# Patient Record
Sex: Male | Born: 1935 | Race: White | Hispanic: No | Marital: Married | Smoking: Former smoker
Health system: Southern US, Community
[De-identification: ages and names within clinical notes are randomized; demographics above are authoritative.]

## PROBLEM LIST (undated history)

## (undated) DIAGNOSIS — E162 Hypoglycemia, unspecified: Secondary | ICD-10-CM

## (undated) DIAGNOSIS — I712 Thoracic aortic aneurysm, without rupture, unspecified: Secondary | ICD-10-CM

## (undated) DIAGNOSIS — I2510 Atherosclerotic heart disease of native coronary artery without angina pectoris: Secondary | ICD-10-CM

## (undated) DIAGNOSIS — I714 Abdominal aortic aneurysm, without rupture, unspecified: Secondary | ICD-10-CM

## (undated) DIAGNOSIS — Z720 Tobacco use: Secondary | ICD-10-CM

## (undated) DIAGNOSIS — J449 Chronic obstructive pulmonary disease, unspecified: Secondary | ICD-10-CM

## (undated) DIAGNOSIS — IMO0002 Reserved for concepts with insufficient information to code with codable children: Secondary | ICD-10-CM

## (undated) DIAGNOSIS — C3490 Malignant neoplasm of unspecified part of unspecified bronchus or lung: Secondary | ICD-10-CM

## (undated) DIAGNOSIS — I10 Essential (primary) hypertension: Secondary | ICD-10-CM

## (undated) DIAGNOSIS — E7800 Pure hypercholesterolemia, unspecified: Secondary | ICD-10-CM

## (undated) DIAGNOSIS — I350 Nonrheumatic aortic (valve) stenosis: Secondary | ICD-10-CM

## (undated) DIAGNOSIS — I739 Peripheral vascular disease, unspecified: Secondary | ICD-10-CM

## (undated) DIAGNOSIS — Z973 Presence of spectacles and contact lenses: Secondary | ICD-10-CM

## (undated) DIAGNOSIS — Z9289 Personal history of other medical treatment: Secondary | ICD-10-CM

## (undated) DIAGNOSIS — R3129 Other microscopic hematuria: Secondary | ICD-10-CM

## (undated) DIAGNOSIS — IMO0001 Reserved for inherently not codable concepts without codable children: Secondary | ICD-10-CM

## (undated) DIAGNOSIS — M109 Gout, unspecified: Secondary | ICD-10-CM

## (undated) HISTORY — DX: Thoracic aortic aneurysm, without rupture, unspecified: I71.20

## (undated) HISTORY — PX: CATARACT EXTRACTION W/ INTRAOCULAR LENS IMPLANT: SHX1309

## (undated) HISTORY — PX: APPENDECTOMY: SHX54

## (undated) HISTORY — DX: Chronic obstructive pulmonary disease, unspecified: J44.9

## (undated) HISTORY — PX: NASAL HEMORRHAGE CONTROL: SHX287

## (undated) HISTORY — DX: Thoracic aortic aneurysm, without rupture: I71.2

## (undated) HISTORY — PX: CARDIAC VALVE REPLACEMENT: SHX585

## (undated) HISTORY — DX: Pure hypercholesterolemia, unspecified: E78.00

## (undated) HISTORY — DX: Essential (primary) hypertension: I10

## (undated) HISTORY — DX: Nonrheumatic aortic (valve) stenosis: I35.0

## (undated) HISTORY — PX: TONSILLECTOMY: SUR1361

## (undated) HISTORY — DX: Peripheral vascular disease, unspecified: I73.9

## (undated) HISTORY — DX: Abdominal aortic aneurysm, without rupture, unspecified: I71.40

## (undated) HISTORY — PX: OTHER SURGICAL HISTORY: SHX169

## (undated) HISTORY — PX: CARDIAC CATHETERIZATION: SHX172

## (undated) HISTORY — PX: CATARACT EXTRACTION: SUR2

## (undated) HISTORY — DX: Other microscopic hematuria: R31.29

## (undated) HISTORY — DX: Abdominal aortic aneurysm, without rupture: I71.4

---

## 2011-09-16 DIAGNOSIS — I70209 Unspecified atherosclerosis of native arteries of extremities, unspecified extremity: Secondary | ICD-10-CM | POA: Insufficient documentation

## 2013-09-06 ENCOUNTER — Encounter: Payer: Medicare Other | Admitting: Thoracic Surgery (Cardiothoracic Vascular Surgery)

## 2013-09-06 ENCOUNTER — Ambulatory Visit (HOSPITAL_COMMUNITY)
Admission: RE | Admit: 2013-09-06 | Discharge: 2013-09-06 | Disposition: A | Discharge status: Home / Self Care | Payer: Medicare Other | Source: Ambulatory Visit | Attending: Cardiothoracic Surgery | Admitting: Cardiothoracic Surgery

## 2013-09-06 ENCOUNTER — Encounter: Payer: Self-pay | Admitting: Cardiothoracic Surgery

## 2013-09-06 ENCOUNTER — Encounter: Payer: Self-pay | Admitting: *Deleted

## 2013-09-06 ENCOUNTER — Other Ambulatory Visit: Payer: Self-pay | Admitting: *Deleted

## 2013-09-06 ENCOUNTER — Institutional Professional Consult (permissible substitution) (INDEPENDENT_AMBULATORY_CARE_PROVIDER_SITE_OTHER): Payer: Medicare Other | Admitting: Cardiothoracic Surgery

## 2013-09-06 VITALS — BP 126/76 | HR 66 | Resp 16 | Ht 68.5 in | Wt 145.0 lb

## 2013-09-06 DIAGNOSIS — I712 Thoracic aortic aneurysm, without rupture, unspecified: Secondary | ICD-10-CM | POA: Insufficient documentation

## 2013-09-06 DIAGNOSIS — R911 Solitary pulmonary nodule: Secondary | ICD-10-CM

## 2013-09-06 DIAGNOSIS — D381 Neoplasm of uncertain behavior of trachea, bronchus and lung: Secondary | ICD-10-CM

## 2013-09-06 DIAGNOSIS — I350 Nonrheumatic aortic (valve) stenosis: Secondary | ICD-10-CM

## 2013-09-06 DIAGNOSIS — I739 Peripheral vascular disease, unspecified: Secondary | ICD-10-CM | POA: Insufficient documentation

## 2013-09-06 DIAGNOSIS — J984 Other disorders of lung: Secondary | ICD-10-CM | POA: Diagnosis present

## 2013-09-06 DIAGNOSIS — J449 Chronic obstructive pulmonary disease, unspecified: Secondary | ICD-10-CM

## 2013-09-06 DIAGNOSIS — I714 Abdominal aortic aneurysm, without rupture, unspecified: Secondary | ICD-10-CM | POA: Insufficient documentation

## 2013-09-06 DIAGNOSIS — I38 Endocarditis, valve unspecified: Secondary | ICD-10-CM

## 2013-09-06 DIAGNOSIS — I359 Nonrheumatic aortic valve disorder, unspecified: Secondary | ICD-10-CM

## 2013-09-06 DIAGNOSIS — I10 Essential (primary) hypertension: Secondary | ICD-10-CM | POA: Insufficient documentation

## 2013-09-06 DIAGNOSIS — E7800 Pure hypercholesterolemia, unspecified: Secondary | ICD-10-CM | POA: Insufficient documentation

## 2013-09-06 LAB — PULMONARY FUNCTION TEST
DL/VA % pred: 58 %
DL/VA: 2.59 ml/min/mmHg/L
DLCO cor % pred: 42 %
DLCO cor: 12.74 ml/min/mmHg
DLCO unc % pred: 42 %
DLCO unc: 12.74 ml/min/mmHg
FEF 25-75 Post: 0.7 L/sec
FEF 25-75 Pre: 0.57 L/sec
FEF2575-%Change-Post: 22 %
FEF2575-%Pred-Post: 41 %
FEF2575-%Pred-Pre: 34 %
FEV1-%Change-Post: 2 %
FEV1-%Pred-Post: 60 %
FEV1-%Pred-Pre: 59 %
FEV1-Post: 1.52 L
FEV1-Pre: 1.49 L
FEV1FVC-%Change-Post: -5 %
FEV1FVC-%Pred-Pre: 77 %
FEV6-%Change-Post: 8 %
FEV6-%Pred-Post: 83 %
FEV6-%Pred-Pre: 76 %
FEV6-Post: 2.77 L
FEV6-Pre: 2.56 L
FEV6FVC-%Change-Post: 0 %
FEV6FVC-%Pred-Post: 102 %
FEV6FVC-%Pred-Pre: 102 %
FVC-%Change-Post: 8 %
FVC-%Pred-Post: 82 %
FVC-%Pred-Pre: 75 %
FVC-Post: 2.94 L
FVC-Pre: 2.7 L
Post FEV1/FVC ratio: 52 %
Post FEV6/FVC ratio: 94 %
Pre FEV1/FVC ratio: 55 %
Pre FEV6/FVC Ratio: 95 %
RV % pred: 107 %
RV: 2.8 L
TLC % pred: 91 %
TLC: 6.07 L

## 2013-09-06 MED ORDER — ALBUTEROL SULFATE (2.5 MG/3ML) 0.083% IN NEBU
2.5000 mg | INHALATION_SOLUTION | Freq: Once | RESPIRATORY_TRACT | Status: AC
  Administered 2013-09-06: 2.5 mg via RESPIRATORY_TRACT

## 2013-09-06 NOTE — Progress Notes (Signed)
[address]       [place] [address]             [phone]                    [name] Record #[id number] Date of Birth: 07-20-1929  Referring: Augustin Coupe, PA Primary Care: Augustin Coupe, [place]  Chief Complaint:    Chief Complaint  Patient presents with  . Lung Lesion    EVAL AND TREAT....ct chest, pet, echo    History of Present Illness:    Alfred Hall 80 y.o. male is seen in the office  today for newly diagnosed left upper lobe lung mass. The patient has been a long-term smoker x60 years with underlying COPD, limited in his pulmonary reserve but not on oxygen at home. He had a known abdominal aortic aneurysms followed at [facility] but not seen since 2013. He had a recent "checkup" at the [facility] in [facility], a chest x-ray suggested a little left upper lobe lung mass. His local provider in [place] arrange CT scan of the chest , PET scan, and echocardiogram. He is referred for further evaluation and deciding on a treatment plan.  In addition to the suspicious left upper lobe lung mass, he's recently been found to have a 5.5 cm ascending aortic aneurysm, critical aortic stenosis by echocardiogram ejection fraction 40-45% peak gradient 77 mean gradient 53, and aortic valve velocity 4.4 m/s, and moderate mitral stenosis. Abdominal aortic aneurysm has increased in size from 4.6 cm in 2013 to 5.2 now.   Surprisingly the patient has few symptoms but is fairly sedentary in his lifestyle. He does care for his ill wife, does light housework grocery shopping, but does not do any yard work around his house because of shortness of breath with exertion. He denies any syncope, denies angina, denies pedal edema. His major limiting symptom is shortness of breath with exertion.   Current Activity/ Functional Status:  Patient is independent with mobility/ambulation, transfers, ADL's, IADL's.   Zubrod Score: At the  time of surgery this patient's most appropriate activity status/level should be described as: []     0    Normal activity, no symptoms [x]     1    Restricted in physical strenuous activity but ambulatory, able to do out light work []     2    Ambulatory and capable of self care, unable to do work activities, up and about               >50 % of waking hours                              []     3    Only limited self care, in bed greater than 50% of waking hours []     4    Completely disabled, no self care, confined to bed or chair []     5    Moribund   Past Medical History  Diagnosis Date  . Lung nodule   . PVD (peripheral vascular disease)   . Microhematuria     workup negative  . Aortic aneurysm, abdominal     followed at [facility]  . Hypercholesteremia   . Hypertension   . Valvular heart disease  recent echocardiogram noted above   . Thoracic aortic aneurysm without rupture  recently noted on  CT of the chest  by my measurement 5.2 x 5.4     Past Surgical History  Procedure Laterality Date  . Cataract extraction    . Eye surgery      CATARACT    Family History  Problem Relation Age of Onset  . Renal Disease Father   . Congestive Heart Failure Mother    patient's mother died of COPD at age 78, father died at age 42 with diabetes, one sister is alive with COPD  History   Social History  . Marital Status: Married    Spouse Name: N/A    Number of Children: 3  . Years of Education: N/A   Occupational History  . retired from [employer]  . Smoking status: Current Every Day Smoker -- 0.50 packs/day for 60 years    Types: Cigarettes  . Smokeless tobacco: Never Used  . Alcohol Use: No  . Drug Use: No  . Sexual Activity: Not on file      History  Smoking status  . Current Every Day Smoker -- 0.50 packs/day for 60 years  . Types: Cigarettes  Smokeless tobacco  . Never Used    History  Alcohol Use No     No Known Allergies  Current  Outpatient Prescriptions  Medication Sig Dispense Refill  . aspirin EC 325 MG tablet Take 325 mg by mouth daily.      . Garlic 295 MG CAPS Take 1 capsule by mouth daily.      . hydrochlorothiazide (HYDRODIURIL) 25 MG tablet Take 25 mg by mouth daily.      . Multiple Vitamins-Minerals (CENTRUM SILVER ADULT 50+ PO) Take 1 tablet by mouth daily.      . Omega-3 Fatty Acids (FISH OIL) 1000 MG CAPS Take 1 capsule by mouth daily.      . simvastatin (ZOCOR) 80 MG tablet Take 80 mg by mouth daily. Takes 1/2 tab daily      . vitamin E (VITAMIN E) 400 UNIT capsule Take 400 Units by mouth daily.       No current facility-administered medications for this visit.     Review of Systems:     Cardiac Review of Systems: Y or N  Chest Pain [  n  ]  Resting SOB [n   ] Exertional SOB  Blue.Reese  ]  Orthopnea Florencio.Farrier  ]   Pedal Edema [ n  ]    Palpitations [ n ] Syncope  [n  ]   Presyncope [n   ]  General Review of Systems: [Y] = yes [  ]=no Constitional: recent weight change [n  ];  Wt loss over the last 3 months [   ] anorexia [  ]; fatigue Blue.Reese  ]; nausea [n  ]; night sweats [ n ]; fever [n  ]; or chills [n  ];          Dental: poor dentition[ dentures ]; Last Dentist visit:   Eye : blurred vision [  ]; diplopia [   ]; vision changes [  ];  Amaurosis fugax[  ]; Resp: cough [  ];  wheezing[  ];  hemoptysis[  ]; shortness of breath[ y ]; paroxysmal nocturnal dyspnea[ n ]; dyspnea on exertion[ y ]; or orthopnea[ n ];  GI:  gallstones[  ], vomiting[n  ];  dysphagia[  ]; melena[  ];  hematochezia [  ]; heartburn[  ];   Hx of  Colonoscopy[  ];  GU: kidney stones [n  ]; hematuria[ n ];   dysuria [  ];  nocturia[  ];  history of     obstruction [  ]; urinary frequency [  ]             Skin: rash, swelling[  ];, hair loss[  ];  peripheral edema[ n ];  or itching[  ]; Musculosketetal: myalgias[  ];  joint swelling[  ];  joint erythema[  ];  joint pain[  ];  back pain[  ];  Heme/Lymph: bruising[ y ];  bleeding[  ];  anemia[  ];    Neuro: TIA[  ];  headaches[  ];  stroke[  ];  vertigo[  ];  seizures[  ];   paresthesias[  ];  difficulty walking[ n ];  Psych:depression[  ]; anxiety[  ];  Endocrine: diabetes[n  ];  thyroid dysfunction[n  ];  Immunizations: Flu up to date Blue.Reese  ]; Pneumococcal up to date [ y ];  Other:  Physical Exam: BP 126/76  Pulse 66  Resp 16  Ht 5' 8.5" (1.74 m)  Wt 145 lb (65.772 kg)  BMI 21.72 kg/m2  SpO2 96%  PHYSICAL EXAMINATION:  General appearance: alert, cooperative, appears stated age and no distress Neurologic: intact Heart: regular rate and rhythm and systolic murmur: holosystolic 4/6, crescendo throughout the precordium Lungs: clear to auscultation bilaterally Abdomen: soft, non-tender; bowel sounds normal; no masses,  no organomegaly Extremities: extremities normal, atraumatic, no cyanosis or edema and Homans sign is negative, no sign of DVT Patient has no cervical or supraclavicular adenopathy   Diagnostic Studies & Laboratory data:     Recent Radiology Findings:    CT THORAX(CHEST) W/IV CONT. . Aug 23, 2013 10:44:49 AM HISTORY: left lobe mass 518.89 OTHER DISEASES OF LUNG, NOT ELSEWHERE CLASSIFIED COMPARISON: None TECHNIQUE: CT of the chest was performed following IV contrast administration (147ml Isovue).  FINDINGS: Mediastinum: 1.5 cm right thyroid nodule with internal calcifications . Ascending aorta is dilated measuring 5.1 cm. Severe aortic valve plane calcifications are present. There is severe descending aortic atherosclerosis with ulcerated plaques image 57. Esophagus appears normal. No mm adenopathy.  Limited abdomen: Simple attenuation left renal cyst. Otherwise negative.  Musculoskeletal: Bones osteopenic. Minimal spinal curvature.  Lungs: Polyps or more likely extensive debris within the trachea. Severe emphysema. Mild bronchial wall thickening. 2.0 cm spiculated nodule in the left upper lobe. There is a small amount of adjacent pneumonitis. This abuts the  major fissure and is distant from the carina. No effusion.  Impression: 1. 2.0 cm nodule in the left upper lobe compatible with bronchogenic carcinoma. No adenopathy by size criteria or sign of distant metastasis. 2. Aortic stenosis with aneurysmal dilatation of the ascending aorta. 3. Severe emphysema. 4. Severe atherosclerosis. 1.57 calcified right thyroid nodule.. 5. Polyps or debris within the trachea. Debris is favored.   NM PET/CT SKULL BASE TO MID THIGH. Aug 28, 2013 08:30:00 PM  Clinical Information: 793.11 SOLITARY PULMONARY NODULE 2CT  Radiopharmaceutical: 12.60 mCi of F-18 Fluorodeoxyglucose, IV.  Technique: Images from skull base to proximal femora were obtained and reconstructed in the sagittal, coronal and axial planes. CT localizer scan was performed with CT fusion images evaluated in the workstation.  The patient's blood Glucose level measures 96 mg/dl. The patient stands 5 ft 8 inches and weighs 145 lbs. Uptake time: 60 min. CTDI: 6.39 mGy.  Findings: There is no prior PET scan for comparison. The 2 cm spiculated nodule in in the left upper lobe  shows marked FDG accumulation with a mean uptake of 14.20, maximal 20.95 SUV by bodyweight. No mediastinal or hilar nodal uptake is seen. There is minimal uptake in the oral pharyngeal area which is physiologic. There is also physiologic uptake within the myocardium, the liver, spleen, kidneys with tracer accumulation within the urinary bladder. Activity with the bowel lumen appears to be luminal.  There is an aneurysm of the ascending aorta measuring 5.9 cm x 5.3 cm. A right parapelvic cyst is present. There is a 5 cm low density oval mass in the upper pole of the left kidney with a mean attenuation value of 13 Hounsfield units. Cholecystectomy clips are seen. There is a large infrarenal abdominal aneurysm which does not extend into the bifurcation measuring 5.2 cm x 5.1 cm. Mild dilatation of the right common iliac artery is seen. The  prostate gland is enlarged with some coarse calcification within it. Possible hydroceles in the scrotum.  Impression: 1. Marked FDG uptake in the left upper lobe nodule compatible with a malignant neoplastic process. 2. No definite evidence of mediastinal or hilar metastases. No suspicious uptake in the neck, abdomen or pelvis. 3. There is an aneurysm of the ascending aorta measuring 5.9 cm x 5.3 cm. Large infrarenal abdominal aortic aneurysm measures 5.2 cm x 5.1 cm. 4. Mild dilatation of the proximal right common iliac artery. 5. Enlarged prostate gland. There may be hydrocele in the scrotum.     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 I discussed the radiographic findings and other newly discovered medical problems with the patient and his daughter. I recommended #1 evaluation by cardiology  #2 pulmonary function studies- done today with FEV1 of 1.49 59% of predicted DLCO significantly decreased 12.74  42%  We'll have the patient return to the multidisciplinary thoracic oncology clinic to see me and radiation oncology, if we decide the patient's underlying risk for pulmonary resection is too high consideration for stereotactic radiotherapy of the left upper lobe lung lesion may be appropriate.  I plan to see the patient back in the multidisciplinary thoracic oncology clinic in 2 weeks (aug 6)    Grace Isaac MD      [address] [place] [address] Office  [phone]   Beeper [phone]  09/06/2013 2:17 PM

## 2013-09-07 ENCOUNTER — Telehealth: Payer: Self-pay | Admitting: *Deleted

## 2013-09-07 NOTE — Telephone Encounter (Signed)
Called pt with appt for [place] 09/21/13 to see Rad Onc 3:30 and 4:00. He verbalized understanding of appt time and place

## 2013-09-08 ENCOUNTER — Encounter: Payer: Medicare Other | Admitting: Thoracic Surgery (Cardiothoracic Vascular Surgery)

## 2013-09-12 ENCOUNTER — Ambulatory Visit (INDEPENDENT_AMBULATORY_CARE_PROVIDER_SITE_OTHER): Payer: Medicare Other | Admitting: Cardiovascular Disease

## 2013-09-12 ENCOUNTER — Encounter: Payer: Self-pay | Admitting: Cardiovascular Disease

## 2013-09-12 VITALS — BP 110/80 | HR 63 | Ht 68.0 in | Wt 142.0 lb

## 2013-09-12 DIAGNOSIS — R0602 Shortness of breath: Secondary | ICD-10-CM

## 2013-09-12 DIAGNOSIS — I350 Nonrheumatic aortic (valve) stenosis: Secondary | ICD-10-CM

## 2013-09-12 DIAGNOSIS — I359 Nonrheumatic aortic valve disorder, unspecified: Secondary | ICD-10-CM

## 2013-09-12 DIAGNOSIS — Z01818 Encounter for other preprocedural examination: Secondary | ICD-10-CM

## 2013-09-12 DIAGNOSIS — Z5181 Encounter for therapeutic drug level monitoring: Secondary | ICD-10-CM

## 2013-09-12 NOTE — Patient Instructions (Addendum)
Decrease aspirin to 81 mg a day Stop Vitamine E   Your physician recommends that you schedule a follow-up appointment in: 1 month   You are scheduled for Cardiac Catheterization on Monday, August 3.  Please arrive at the [facility] at 8:30 a.m. on the day of your procedure.  1. DIET:  _X_ Nothing to eat or drink after midnight except your medications with a sip of water.  2. MAKE SURE YOU TAKE YOUR ASPIRIN.  3.  _X__ YOU MAY TAKE ALL of your remaining medications with a small amount of water.  4. Plan for one night stay - bring personal belongings (i.e. toothpaste, toothbrush, etc.)  5. Bring a current list of your medications and current insurance cards.  6. Must have a responsible person to drive you home.   7. Someone must be with you for the first 24 hours after you arrive home.

## 2013-09-12 NOTE — Assessment & Plan Note (Addendum)
Alfred Hall presents with several complex, serious issues. He has a lung mass consistent with bronchogenic carcinoma. He has a thoracic aortic aneurysm and also has severe aortic stenosis. He also has COPD.  Despite all these problems, he has minimal symptoms.  We had a long discussion about the severity of his multiple medical issues.  He has lung cancer - appears to be a single isolated nodule at this point. There is no evidence of metastases. He has a thoracic aortic aneurysm as well as severe aortic stenosis. Complicating matters is the fact he also has at least moderate COPD.  If we determine that he is a surgical candidate he should probably have resection of his lung cancer, repair of his aortic aneurysm and aortic valve with possible coronary bypass grafting at the same time. This would be a very large operation and the final decision on this issue will be up to Dr. Servando Snare.  We need to see if he has significant coronary artery disease that needs bypass grafting.    We'll schedule him for cardiac catheterization on August 3 to evaluate his coronary arteries into further define his aortic stenosis. He's had an echocardiogram performed at the [facility] but am unable to find the study on our system.  The peak aortic valve gradient is 77 with a mean rate of 53 according to the echo  Report.  I think we can reduce his ASA dose to 81 mg a day. He has vit E listed on our med list but he does not think that he is taking this.  He has Vit D listed instead  I will see him in 1 month for follow up .

## 2013-09-12 NOTE — Progress Notes (Signed)
Alfred Hall Date of Birth  Jan 07, 1930       [facility] [address], [place], [place]  [address]   [place], [place]  [address] [place]   Fax  [phone]     Fax [phone]  Problem List: 1. Aortic stenosis-severe 2. Lung cancer 3. Thoracic aortic aneurysm 4. Hyperlipidemia 5. COPD 6.  Hypertension 7. Abdominal aortic aneurism -  8. PAD - stenting of lower extremities at [facility].   History of Present Illness:  Mr. Colter is an 80 yo who is typically followed at the [place] in [place].   He was found to have a lung mass.  Further evaluation revealed that this was c/w bronchogenic carcinoma.  He was also found to have a 5.5 cm thoracic aortic aneurysm and aortic stenosis.  He also has COPD.   He has minimal symptoms - minimal dyspnea with his daily activities.   He does not go out for exercise.    He has been losing weight.  Current Outpatient Prescriptions on File Prior to Visit  Medication Sig Dispense Refill  . aspirin EC 325 MG tablet Take 325 mg by mouth daily.      . Garlic 097 MG CAPS Take 1 capsule by mouth daily.      . hydrochlorothiazide (HYDRODIURIL) 25 MG tablet Take 25 mg by mouth daily.      . Multiple Vitamins-Minerals (CENTRUM SILVER ADULT 50+ PO) Take 1 tablet by mouth daily.      . Omega-3 Fatty Acids (FISH OIL) 1000 MG CAPS Take 1 capsule by mouth daily.      . simvastatin (ZOCOR) 80 MG tablet Take 80 mg by mouth daily. Takes 1/2 tab daily      . vitamin E (VITAMIN E) 400 UNIT capsule Take 400 Units by mouth daily.       No current facility-administered medications on file prior to visit.    No Known Allergies  Past Medical History  Diagnosis Date  . Lung nodule   . PVD (peripheral vascular disease)   . Microhematuria     workup negative  . Aortic aneurysm, abdominal     followed at [facility]  . Hypercholesteremia   . Hypertension   . Valvular heart  disease   . Thoracic aortic aneurysm without rupture   . Aortic stenosis     Past Surgical History  Procedure Laterality Date  . Cataract extraction    . Eye surgery      CATARACT    History  Smoking status  . Current Every Day Smoker -- 0.50 packs/day for 60 years  . Types: Cigarettes  Smokeless tobacco  . Never Used    History  Alcohol Use No    Family History  Problem Relation Age of Onset  . Renal Disease Father   . Congestive Heart Failure Mother     Reviw of Systems:  Reviewed in the HPI.  All other systems are negative.  Physical Exam: Blood pressure 110/80, pulse 63, height 5\' 8"  (1.727 m), weight 142 lb (64.411 kg). Wt Readings from Last 3 Encounters:  09/12/13 142 lb (64.411 kg)  09/06/13 145 lb (65.772 kg)     General: Well developed, well nourished, in no acute distress.  Head: Normocephalic, atraumatic, sclera non-icteric, mucus membranes are moist,   Neck: Supple. Carotids are 2 + without bruits. No JVD   Lungs:  Clear   Heart: RR, soft heart sounds, 1-2 / systolic murmur  Abdomen: Soft, non-tender, non-distended with normal bowel sounds.+ pulsitile mass c/w aneurism.   Msk:  Strength and tone are normal   Extremities: No clubbing or cyanosis. No edema.  Distal pedal pulses are 2+ and equal    Neuro: CN II - XII intact.  Alert and oriented X 3.   Psych:  Normal   ECG: September 12, 2013:  NSR at 63, diffue NS ST abnormality.   Assessment / Plan:

## 2013-09-13 ENCOUNTER — Encounter (HOSPITAL_COMMUNITY): Payer: Self-pay | Admitting: Pharmacy Technician

## 2013-09-13 LAB — BASIC METABOLIC PANEL WITH GFR
BUN/Creatinine Ratio: 18 (ref 10–22)
BUN: 26 mg/dL (ref 8–27)
CO2: 25 mmol/L (ref 18–29)
Calcium: 10.1 mg/dL (ref 8.6–10.2)
Chloride: 95 mmol/L — ABNORMAL LOW (ref 97–108)
Creatinine, Ser: 1.46 mg/dL — ABNORMAL HIGH (ref 0.76–1.27)
GFR calc Af Amer: 51 mL/min/1.73 — ABNORMAL LOW
GFR calc non Af Amer: 44 mL/min/1.73 — ABNORMAL LOW
Glucose: 83 mg/dL (ref 65–99)
Potassium: 5 mmol/L (ref 3.5–5.2)
Sodium: 138 mmol/L (ref 134–144)

## 2013-09-13 LAB — CBC WITH DIFFERENTIAL/PLATELET
Basophils Absolute: 0 x10E3/uL (ref 0.0–0.2)
Basos: 0 %
Eos: 2 %
Eosinophils Absolute: 0.2 x10E3/uL (ref 0.0–0.4)
HCT: 41.8 % (ref 37.5–51.0)
Hemoglobin: 14.3 g/dL (ref 12.6–17.7)
Immature Grans (Abs): 0 x10E3/uL (ref 0.0–0.1)
Immature Granulocytes: 0 %
Lymphocytes Absolute: 1.6 x10E3/uL (ref 0.7–3.1)
Lymphs: 21 %
MCH: 29.4 pg (ref 26.6–33.0)
MCHC: 34.2 g/dL (ref 31.5–35.7)
MCV: 86 fL (ref 79–97)
Monocytes Absolute: 0.8 x10E3/uL (ref 0.1–0.9)
Monocytes: 11 %
Neutrophils Absolute: 5 x10E3/uL (ref 1.4–7.0)
Neutrophils Relative %: 66 %
RBC: 4.86 x10E6/uL (ref 4.14–5.80)
RDW: 14.8 % (ref 12.3–15.4)
WBC: 7.7 x10E3/uL (ref 3.4–10.8)

## 2013-09-13 LAB — PROTIME-INR
INR: 1.1 (ref 0.8–1.2)
Prothrombin Time: 11.1 s (ref 9.1–12.0)

## 2013-09-18 ENCOUNTER — Encounter (HOSPITAL_COMMUNITY)
Admission: RE | Disposition: A | Discharge status: Home / Self Care | Payer: Self-pay | Source: Ambulatory Visit | Attending: Cardiovascular Disease

## 2013-09-18 ENCOUNTER — Ambulatory Visit (HOSPITAL_COMMUNITY)
Admission: RE | Admit: 2013-09-18 | Discharge: 2013-09-19 | Disposition: A | Discharge status: Home / Self Care | Payer: Medicare Other | Source: Ambulatory Visit | Attending: Cardiovascular Disease | Admitting: Cardiovascular Disease

## 2013-09-18 DIAGNOSIS — I480 Paroxysmal atrial fibrillation: Secondary | ICD-10-CM

## 2013-09-18 DIAGNOSIS — I712 Thoracic aortic aneurysm, without rupture, unspecified: Secondary | ICD-10-CM | POA: Diagnosis not present

## 2013-09-18 DIAGNOSIS — R031 Nonspecific low blood-pressure reading: Secondary | ICD-10-CM | POA: Insufficient documentation

## 2013-09-18 DIAGNOSIS — C3490 Malignant neoplasm of unspecified part of unspecified bronchus or lung: Secondary | ICD-10-CM | POA: Insufficient documentation

## 2013-09-18 DIAGNOSIS — Z79899 Other long term (current) drug therapy: Secondary | ICD-10-CM | POA: Diagnosis not present

## 2013-09-18 DIAGNOSIS — I2510 Atherosclerotic heart disease of native coronary artery without angina pectoris: Secondary | ICD-10-CM | POA: Diagnosis not present

## 2013-09-18 DIAGNOSIS — R911 Solitary pulmonary nodule: Secondary | ICD-10-CM

## 2013-09-18 DIAGNOSIS — F17200 Nicotine dependence, unspecified, uncomplicated: Secondary | ICD-10-CM | POA: Diagnosis not present

## 2013-09-18 DIAGNOSIS — Z8249 Family history of ischemic heart disease and other diseases of the circulatory system: Secondary | ICD-10-CM | POA: Diagnosis not present

## 2013-09-18 DIAGNOSIS — J4489 Other specified chronic obstructive pulmonary disease: Secondary | ICD-10-CM | POA: Insufficient documentation

## 2013-09-18 DIAGNOSIS — E785 Hyperlipidemia, unspecified: Secondary | ICD-10-CM | POA: Diagnosis not present

## 2013-09-18 DIAGNOSIS — I359 Nonrheumatic aortic valve disorder, unspecified: Principal | ICD-10-CM | POA: Insufficient documentation

## 2013-09-18 DIAGNOSIS — I4891 Unspecified atrial fibrillation: Secondary | ICD-10-CM | POA: Diagnosis not present

## 2013-09-18 DIAGNOSIS — I38 Endocarditis, valve unspecified: Secondary | ICD-10-CM | POA: Insufficient documentation

## 2013-09-18 DIAGNOSIS — Z7982 Long term (current) use of aspirin: Secondary | ICD-10-CM | POA: Diagnosis not present

## 2013-09-18 DIAGNOSIS — I10 Essential (primary) hypertension: Secondary | ICD-10-CM | POA: Diagnosis not present

## 2013-09-18 DIAGNOSIS — J449 Chronic obstructive pulmonary disease, unspecified: Secondary | ICD-10-CM | POA: Diagnosis not present

## 2013-09-18 DIAGNOSIS — E7800 Pure hypercholesterolemia, unspecified: Secondary | ICD-10-CM | POA: Diagnosis not present

## 2013-09-18 DIAGNOSIS — Z720 Tobacco use: Secondary | ICD-10-CM | POA: Diagnosis present

## 2013-09-18 DIAGNOSIS — C3410 Malignant neoplasm of upper lobe, unspecified bronchus or lung: Secondary | ICD-10-CM | POA: Diagnosis present

## 2013-09-18 DIAGNOSIS — N179 Acute kidney failure, unspecified: Secondary | ICD-10-CM | POA: Insufficient documentation

## 2013-09-18 DIAGNOSIS — I739 Peripheral vascular disease, unspecified: Secondary | ICD-10-CM | POA: Diagnosis not present

## 2013-09-18 DIAGNOSIS — I350 Nonrheumatic aortic (valve) stenosis: Secondary | ICD-10-CM

## 2013-09-18 HISTORY — DX: Atherosclerotic heart disease of native coronary artery without angina pectoris: I25.10

## 2013-09-18 HISTORY — DX: Tobacco use: Z72.0

## 2013-09-18 HISTORY — PX: LEFT AND RIGHT HEART CATHETERIZATION WITH CORONARY ANGIOGRAM: SHX5449

## 2013-09-18 LAB — POCT I-STAT 3, ART BLOOD GAS (G3+)
Acid-Base Excess: 1 mmol/L (ref 0.0–2.0)
Bicarbonate: 27.2 mEq/L — ABNORMAL HIGH (ref 20.0–24.0)
O2 SAT: 96 %
PCO2 ART: 49.9 mmHg — AB (ref 35.0–45.0)
TCO2: 29 mmol/L (ref 0–100)
pH, Arterial: 7.344 — ABNORMAL LOW (ref 7.350–7.450)
pO2, Arterial: 89 mmHg (ref 80.0–100.0)

## 2013-09-18 LAB — POCT I-STAT 3, VENOUS BLOOD GAS (G3P V)
ACID-BASE EXCESS: 3 mmol/L — AB (ref 0.0–2.0)
Bicarbonate: 29.5 mEq/L — ABNORMAL HIGH (ref 20.0–24.0)
O2 SAT: 69 %
PO2 VEN: 37 mmHg (ref 30.0–45.0)
TCO2: 31 mmol/L (ref 0–100)
pCO2, Ven: 49.8 mmHg (ref 45.0–50.0)
pH, Ven: 7.381 — ABNORMAL HIGH (ref 7.250–7.300)

## 2013-09-18 LAB — POCT ACTIVATED CLOTTING TIME: Activated Clotting Time: 208 seconds

## 2013-09-18 LAB — PLATELET COUNT: Platelets: 195 10*3/uL (ref 150–400)

## 2013-09-18 SURGERY — LEFT AND RIGHT HEART CATHETERIZATION WITH CORONARY ANGIOGRAM
Anesthesia: LOCAL

## 2013-09-18 MED ORDER — ASPIRIN EC 81 MG PO TBEC
81.0000 mg | DELAYED_RELEASE_TABLET | Freq: Every day | ORAL | Status: DC
  Administered 2013-09-19: 08:00:00 81 mg via ORAL
  Filled 2013-09-18 (×2): qty 1

## 2013-09-18 MED ORDER — SODIUM CHLORIDE 0.9 % IJ SOLN
3.0000 mL | Freq: Two times a day (BID) | INTRAMUSCULAR | Status: DC
  Administered 2013-09-19: 3 mL via INTRAVENOUS

## 2013-09-18 MED ORDER — SODIUM CHLORIDE 0.9 % IV SOLN: 250.0000 mL | INTRAVENOUS | Status: DC

## 2013-09-18 MED ORDER — LIDOCAINE HCL (PF) 1 % IJ SOLN
INTRAMUSCULAR | Status: AC
  Filled 2013-09-18: qty 30

## 2013-09-18 MED ORDER — HEPARIN (PORCINE) IN NACL 2-0.9 UNIT/ML-% IJ SOLN
INTRAMUSCULAR | Status: AC
  Filled 2013-09-18: qty 1000

## 2013-09-18 MED ORDER — FENTANYL CITRATE 0.05 MG/ML IJ SOLN
INTRAMUSCULAR | Status: AC
  Filled 2013-09-18: qty 2

## 2013-09-18 MED ORDER — ATORVASTATIN CALCIUM 40 MG PO TABS
40.0000 mg | ORAL_TABLET | Freq: Every day | ORAL | Status: DC
  Administered 2013-09-18: 19:00:00 40 mg via ORAL
  Filled 2013-09-18 (×2): qty 1

## 2013-09-18 MED ORDER — DILTIAZEM HCL 100 MG IV SOLR
5.0000 mg/h | INTRAVENOUS | Status: DC
  Administered 2013-09-18: 5 mg/h via INTRAVENOUS
  Filled 2013-09-18 (×2): qty 100

## 2013-09-18 MED ORDER — NITROGLYCERIN 1 MG/10 ML FOR IR/CATH LAB
INTRA_ARTERIAL | Status: AC
  Filled 2013-09-18: qty 10

## 2013-09-18 MED ORDER — MIDAZOLAM HCL 2 MG/2ML IJ SOLN
INTRAMUSCULAR | Status: AC
  Filled 2013-09-18: qty 2

## 2013-09-18 MED ORDER — HEPARIN SODIUM (PORCINE) 1000 UNIT/ML IJ SOLN
INTRAMUSCULAR | Status: AC
  Filled 2013-09-18: qty 1

## 2013-09-18 MED ORDER — VERAPAMIL HCL 2.5 MG/ML IV SOLN
INTRAVENOUS | Status: AC
  Filled 2013-09-18: qty 2

## 2013-09-18 MED ORDER — ONDANSETRON HCL 4 MG/2ML IJ SOLN: 4.0000 mg | Freq: Four times a day (QID) | INTRAMUSCULAR | Status: DC | PRN

## 2013-09-18 MED ORDER — SODIUM CHLORIDE 0.9 % IJ SOLN: 3.0000 mL | Freq: Two times a day (BID) | INTRAMUSCULAR | Status: DC

## 2013-09-18 MED ORDER — SODIUM CHLORIDE 0.9 % IJ SOLN: 3.0000 mL | INTRAMUSCULAR | Status: DC | PRN

## 2013-09-18 MED ORDER — SODIUM CHLORIDE 0.9 % IV SOLN: 250.0000 mL | INTRAVENOUS | Status: DC | PRN

## 2013-09-18 MED ORDER — SODIUM CHLORIDE 0.9 % IV SOLN
INTRAVENOUS | Status: DC
  Administered 2013-09-18: 10:00:00 via INTRAVENOUS

## 2013-09-18 MED ORDER — SODIUM CHLORIDE 0.9 % IJ SOLN
3.0000 mL | INTRAMUSCULAR | Status: DC | PRN
  Administered 2013-09-18: 3 mL via INTRAVENOUS

## 2013-09-18 MED ORDER — ASPIRIN 81 MG PO CHEW: 81.0000 mg | CHEWABLE_TABLET | ORAL | Status: DC

## 2013-09-18 MED ORDER — HEPARIN (PORCINE) IN NACL 100-0.45 UNIT/ML-% IJ SOLN
1100.0000 [IU]/h | INTRAMUSCULAR | Status: DC
  Administered 2013-09-18: 900 [IU]/h via INTRAVENOUS
  Filled 2013-09-18 (×2): qty 250

## 2013-09-18 MED ORDER — ACETAMINOPHEN 325 MG PO TABS: 650.0000 mg | ORAL_TABLET | ORAL | Status: DC | PRN

## 2013-09-18 MED ORDER — SODIUM CHLORIDE 0.9 % IV SOLN: 1.0000 mL/kg/h | INTRAVENOUS | Status: AC

## 2013-09-18 NOTE — Progress Notes (Addendum)
1515 Cardizem infusion started at 5mg /hr. Patients SBP 80's to 90's and monitored A-fib with rates 120's to 150's with patient maintaining bedrest. Patient denies lightheadness, dizziness, shortness of breath or chest pain with vitals. Instructed to report any changes to nursing.

## 2013-09-18 NOTE — Care Management Note (Addendum)
  Page 1 of 1   09/19/2013     1:48:44 PM CARE MANAGEMENT NOTE 09/19/2013  Patient:  Alfred Hall, Alfred Hall   Account Number:  [id number]  Date Initiated:  09/18/2013  Documentation initiated by:  Terrion Gencarelli  Subjective/Objective Assessment:   LEFT AND RIGHT HEART CATHETERIZATION WITH CORONARY ANGIOGRAM     Action/Plan:   CM to follow for disposition needs   Anticipated DC Date:  09/21/2013   Anticipated DC Plan:  HOME/SELF CARE         Choice offered to / List presented to:             Status of service:  Completed, signed off Medicare Important Message given?   (If response is "NO", the following Medicare IM given date fields will be blank) Date Medicare IM given:   Medicare IM given by:   Date Additional Medicare IM given:   Additional Medicare IM given by:    Discharge Disposition:  HOME/SELF CARE  Per UR Regulation:  Reviewed for med. necessity/level of care/duration of stay  If discussed at [place] of Stay Meetings, dates discussed:    Comments:  Zaide Mcclenahan RN, BSN, MSHL, CCM  Nurse - Case Manager, (Unit [phone]  09/19/2013 Dispositon:  Home / Self care  Raianna Slight RN, BSN, MSHL, CCM  Nurse - Case Manager, (Unit [phone]  09/18/2013 Cardizem gtt; fluids 75cc/hour Dispositon Plan:  Home self care.

## 2013-09-18 NOTE — CV Procedure (Signed)
    Cardiac Catheterization Procedure Note  Name: Alfred Hall MRN: [id number] DOB: [birth year]  Procedure: Right Heart Cath, Left Heart Cath, Selective Coronary Angiography, aortic root angiography  Indication: Severe aortic stenosis, ascending aortic aneurysm   Procedural Details: There was an indwelling IV in a right antecubital vein. Using normal sterile technique, the IV was changed out for a 5 Fr brachial sheath over a 0.018 inch wire. The right wrist was then prepped, draped, and anesthetized with 1% lidocaine. Using the modified Seldinger technique a 5/6 French Slender sheath was placed in the right radial artery. Intra-arterial verapamil was administered through the radial artery sheath. IV heparin was administered after a JR4 catheter was advanced into the central aorta. A Swan-Ganz catheter was initially used for the right heart catheterization but I was unable to navigate this catheter into the RV/PA. I was able to use a JR4 and angled glidewire to advance into the PA and perform right heart cath. Standard protocol was followed for recording of right heart pressures and sampling of oxygen saturations. Fick cardiac output was calculated. Standard Judkins catheters were used for selective coronary angiography and aortic root angiography. The patient went into atrial fibrillation during the procedure. He is initially asymptomatic with this. The patient was transferred to the post catheterization recovery area for further monitoring.  Procedural Findings: Hemodynamics RA 4 RV 23/2 PA 23/7 mean 11 PCWP not recorded LV not recorded AO 107/53  Oxygen saturations: PA 69 AO 96  Cardiac Output (Fick) 4.5  Cardiac Index (Fick) 2.5   Coronary angiography: Coronary dominance: left  Left mainstem: The left main is short, heavily calcified, with minor 20% stenosis noted.  Left anterior descending (LAD): The LAD is moderately calcified. There is diffuse 60-70% stenosis throughout the  mid LAD. The vessel wraps around the left ventricular apex. The first septal perforator is patent. There is a tiny diagonal branch arising from the LAD.  Left circumflex (LCx): The left circumflex is dominant. The vessel is large in caliber. The proximal vessel has 40% irregular stenosis. There is an intermediate branch without significant disease. The obtuse marginal branches are patent without significant disease. The left PDA branch is patent without significant disease.  Right coronary artery (RCA): Small, nondominant vessel supplying 2 small RV marginal  Aortic root angiography: The aortic valve has a very severe calcification with restricted motion. There is mild aortic insufficiency. The aortic root and ascending aorta are dilated.  Estimated Blood Loss: Minimal  Final Conclusions:    1. Known severe aortic stenosis with a severely calcified and restricted aortic valve on plain fluoroscopy 2. Mild to moderate CAD with moderate mid LAD stenosis and nonobstructive left circumflex stenosis and a left dominant coronary system 3. Normal right heart pressures 4. Ascending aortic aneurysm   Recommendations: the patient went into atrial fibrillation during the procedure. His heart rate is erratic but ranging from 70-110 beats per minute. Considering his severe aortic stenosis, I think he should be admitted for observation. He will be started on low-dose IV diltiazem and IV heparin. Plan elective cardioversion tomorrow morning if he does not convert spontaneously. Since his atrial fib onset was witnessed in the cardiac Cath Lab, I do not think he will require long-term anticoagulation unless he has recurrence.   Sherren Mocha MD, [facility] 09/18/2013, 1:32 PM

## 2013-09-18 NOTE — Progress Notes (Signed)
Site area: right brachial vein Site Prior to Removal:  Level 0 Pressure Applied For :20 minutes Manual:   yes Patient Status During Pull:  Stable Post Pull Site:  Level 0 Post Pull Instructions Given:  yes Post Pull Pulses Present: n/a Dressing Applied:  Tegaderm Bedrest begins @ n/a Comments:No complications

## 2013-09-18 NOTE — H&P (View-Only) (Signed)
Alfred Hall Date of Birth  04-21-1929       [facility] [address], [place], [place]  [address]   [place], [place]  [address] [place]   Fax  [phone]     Fax [phone]  Problem List: 1. Aortic stenosis-severe 2. Lung cancer 3. Thoracic aortic aneurysm 4. Hyperlipidemia 5. COPD 6.  Hypertension 7. Abdominal aortic aneurism -  8. PAD - stenting of lower extremities at [facility].   History of Present Illness:  Alfred Hall is an 80 yo who is typically followed at the [facility] in [place], [place].   He was found to have a lung mass.  Further evaluation revealed that this was c/w bronchogenic carcinoma.  He was also found to have a 5.5 cm thoracic aortic aneurysm and aortic stenosis.  He also has COPD.   He has minimal symptoms - minimal dyspnea with his daily activities.   He does not go out for exercise.    He has been losing weight.  Current Outpatient Prescriptions on File Prior to Visit  Medication Sig Dispense Refill  . aspirin EC 325 MG tablet Take 325 mg by mouth daily.      . Garlic 275 MG CAPS Take 1 capsule by mouth daily.      . hydrochlorothiazide (HYDRODIURIL) 25 MG tablet Take 25 mg by mouth daily.      . Multiple Vitamins-Minerals (CENTRUM SILVER ADULT 50+ PO) Take 1 tablet by mouth daily.      . Omega-3 Fatty Acids (FISH OIL) 1000 MG CAPS Take 1 capsule by mouth daily.      . simvastatin (ZOCOR) 80 MG tablet Take 80 mg by mouth daily. Takes 1/2 tab daily      . vitamin E (VITAMIN E) 400 UNIT capsule Take 400 Units by mouth daily.       No current facility-administered medications on file prior to visit.    No Known Allergies  Past Medical History  Diagnosis Date  . Lung nodule   . PVD (peripheral vascular disease)   . Microhematuria     workup negative  . Aortic aneurysm, abdominal     followed at [facility]  . Hypercholesteremia   . Hypertension   . Valvular heart  disease   . Thoracic aortic aneurysm without rupture   . Aortic stenosis     Past Surgical History  Procedure Laterality Date  . Cataract extraction    . Eye surgery      CATARACT    History  Smoking status  . Current Every Day Smoker -- 0.50 packs/day for 60 years  . Types: Cigarettes  Smokeless tobacco  . Never Used    History  Alcohol Use No    Family History  Problem Relation Age of Onset  . Renal Disease Father   . Congestive Heart Failure Mother     Reviw of Systems:  Reviewed in the HPI.  All other systems are negative.  Physical Exam: Blood pressure 110/80, pulse 63, height 5\' 8"  (1.727 m), weight 142 lb (64.411 kg). Wt Readings from Last 3 Encounters:  09/12/13 142 lb (64.411 kg)  09/06/13 145 lb (65.772 kg)     General: Well developed, well nourished, in no acute distress.  Head: Normocephalic, atraumatic, sclera non-icteric, mucus membranes are moist,   Neck: Supple. Carotids are 2 + without bruits. No JVD   Lungs:  Clear   Heart: RR, soft heart sounds, 1-2 / systolic murmur  Abdomen: Soft, non-tender, non-distended with normal bowel sounds.+ pulsitile mass c/w aneurism.   Msk:  Strength and tone are normal   Extremities: No clubbing or cyanosis. No edema.  Distal pedal pulses are 2+ and equal    Neuro: CN II - XII intact.  Alert and oriented X 3.   Psych:  Normal   ECG: September 12, 2013:  NSR at 63, diffue NS ST abnormality.   Assessment / Plan:

## 2013-09-18 NOTE — Interval H&P Note (Signed)
History and Physical Interval Note:  09/18/2013 12:22 PM  Alfred Hall  has presented today for surgery, with the diagnosis of aortic stenosis  The various methods of treatment have been discussed with the patient and family. After consideration of risks, benefits and other options for treatment, the patient has consented to  Procedure(s): LEFT AND RIGHT HEART CATHETERIZATION WITH CORONARY ANGIOGRAM (N/A) as a surgical intervention .  The patient's history has been reviewed, patient examined, no change in status, stable for surgery.  I have reviewed the patient's chart and labs.  Questions were answered to the patient's satisfaction.     Sherren Mocha

## 2013-09-18 NOTE — Progress Notes (Signed)
Patient remains on the cardiac monitor in A fib rate decreased to 90's to 100's with SBP maintaining in the 80's. Patient tolerating well with no reported symptoms.

## 2013-09-18 NOTE — Progress Notes (Signed)
ANTICOAGULATION CONSULT NOTE - Initial Consult  Pharmacy Consult for heparin Indication: atrial fibrillation  No Known Allergies  Patient Measurements: Height: 5\' 8"  (172.7 cm) Weight: 145 lb (65.772 kg) IBW/kg (Calculated) : 68.4 Heparin Dosing Weight: 65.8 kg  Vital Signs: Temp: 97.6 F (36.4 C) (08/03 0840) Temp src: Oral (08/03 0840) BP: 114/65 mmHg (08/03 1405) Pulse Rate: 95 (08/03 1405)  Labs:  Recent Labs  09/18/13 0930  PLT 195    Estimated Creatinine Clearance: 36.3 ml/min (by C-G formula based on Cr of 1.46).   Medical History: Past Medical History  Diagnosis Date  . Lung nodule   . PVD (peripheral vascular disease)   . Microhematuria     workup negative  . Aortic aneurysm, abdominal     followed at [facility]  . Hypercholesteremia   . Hypertension   . Valvular heart disease   . Thoracic aortic aneurysm without rupture   . Aortic stenosis     Medications:  Scheduled:  . aspirin EC  81 mg Oral Daily  . atorvastatin  40 mg Oral q1800  . sodium chloride  3 mL Intravenous Q12H   Infusions:  . sodium chloride 1 mL/kg/hr (09/18/13 1348)  . diltiazem (CARDIZEM) infusion      Assessment: 80 yo male s/p cath will be started on heparin due to witnessed afib in the cath lab.  Baseline Hgb 14.3, Plt 195 K and INR 1.1.  Sheath will be removed at 1600 per RN.  MD wants to start heparin 4 hours post sheath removal.   Goal of Therapy:  Heparin level 0.3-0.7 units/ml Monitor platelets by anticoagulation protocol: Yes   Plan:  1) Initiate heparin at 900 units/hr at 2000. No bolus 2) 8 heparin level  3) Daily heparin level and CBC  Jennavie Martinek, Tsz-Yin 09/18/2013,2:57 PM

## 2013-09-18 NOTE — Progress Notes (Signed)
Heart rate 100's -110's and SBP maintained in 80's. Patient continues to deny any symptoms of intolerance to blood pressures.

## 2013-09-19 ENCOUNTER — Encounter (HOSPITAL_COMMUNITY): Payer: Self-pay | Admitting: *Deleted

## 2013-09-19 ENCOUNTER — Encounter (HOSPITAL_COMMUNITY)
Admission: RE | Disposition: A | Discharge status: Home / Self Care | Payer: Self-pay | Source: Ambulatory Visit | Attending: Cardiovascular Disease

## 2013-09-19 DIAGNOSIS — I4891 Unspecified atrial fibrillation: Secondary | ICD-10-CM

## 2013-09-19 DIAGNOSIS — I712 Thoracic aortic aneurysm, without rupture, unspecified: Secondary | ICD-10-CM

## 2013-09-19 DIAGNOSIS — I359 Nonrheumatic aortic valve disorder, unspecified: Principal | ICD-10-CM

## 2013-09-19 DIAGNOSIS — R911 Solitary pulmonary nodule: Secondary | ICD-10-CM

## 2013-09-19 DIAGNOSIS — C3490 Malignant neoplasm of unspecified part of unspecified bronchus or lung: Secondary | ICD-10-CM | POA: Diagnosis not present

## 2013-09-19 DIAGNOSIS — C3410 Malignant neoplasm of upper lobe, unspecified bronchus or lung: Secondary | ICD-10-CM | POA: Diagnosis present

## 2013-09-19 DIAGNOSIS — I350 Nonrheumatic aortic (valve) stenosis: Secondary | ICD-10-CM | POA: Insufficient documentation

## 2013-09-19 DIAGNOSIS — I2510 Atherosclerotic heart disease of native coronary artery without angina pectoris: Secondary | ICD-10-CM | POA: Diagnosis present

## 2013-09-19 DIAGNOSIS — I10 Essential (primary) hypertension: Secondary | ICD-10-CM

## 2013-09-19 DIAGNOSIS — E785 Hyperlipidemia, unspecified: Secondary | ICD-10-CM | POA: Diagnosis not present

## 2013-09-19 DIAGNOSIS — Z720 Tobacco use: Secondary | ICD-10-CM | POA: Diagnosis present

## 2013-09-19 LAB — BASIC METABOLIC PANEL
Anion gap: 11 (ref 5–15)
BUN: 26 mg/dL — AB (ref 6–23)
CHLORIDE: 101 meq/L (ref 96–112)
CO2: 26 meq/L (ref 19–32)
CREATININE: 1.1 mg/dL (ref 0.50–1.35)
Calcium: 8.9 mg/dL (ref 8.4–10.5)
GFR calc Af Amer: 70 mL/min — ABNORMAL LOW (ref >90)
GFR calc non Af Amer: 61 mL/min — ABNORMAL LOW (ref >90)
Glucose, Bld: 89 mg/dL (ref 70–99)
Potassium: 3.8 mEq/L (ref 3.7–5.3)
Sodium: 138 mEq/L (ref 137–147)

## 2013-09-19 LAB — HEPARIN LEVEL (UNFRACTIONATED): Heparin Unfractionated: 0.1 IU/mL — ABNORMAL LOW (ref 0.30–0.70)

## 2013-09-19 LAB — CBC
HEMATOCRIT: 40.6 % (ref 39.0–52.0)
Hemoglobin: 13.4 g/dL (ref 13.0–17.0)
MCH: 29.4 pg (ref 26.0–34.0)
MCHC: 33 g/dL (ref 30.0–36.0)
MCV: 89 fL (ref 78.0–100.0)
Platelets: 185 10*3/uL (ref 150–400)
RBC: 4.56 MIL/uL (ref 4.22–5.81)
RDW: 14.1 % (ref 11.5–15.5)
WBC: 9.2 10*3/uL (ref 4.0–10.5)

## 2013-09-19 SURGERY — CARDIOVERSION
Anesthesia: Monitor Anesthesia Care

## 2013-09-19 NOTE — Progress Notes (Signed)
Patient Name: Alfred [name] Date of Encounter: 09/19/2013     Active Problems:   Atrial fibrillation    SUBJECTIVE  Feeling good. No CP or SOB. No lightheadedness or dizziness.   CURRENT MEDS . aspirin EC  81 mg Oral Daily  . atorvastatin  40 mg Oral q1800  . sodium chloride  3 mL Intravenous Q12H    OBJECTIVE  Filed Vitals:   09/18/13 2324 09/19/13 0016 09/19/13 0220 09/19/13 0558  BP: 89/43  97/46 96/51  Pulse: 63   59  Temp: 97.4 F (36.3 C)   98.1 F (36.7 C)  TempSrc: Oral   Oral  Resp: 17   20  Height:      Weight:  141 lb 12.1 oz (64.3 kg)    SpO2: 96%   94%    Intake/Output Summary (Last 24 hours) at 09/19/13 0725 Last data filed at 09/19/13 0600  Gross per 24 hour  Intake 1247.31 ml  Output   1425 ml  Net -177.69 ml   Filed Weights   09/18/13 0840 09/19/13 0016  Weight: 145 lb (65.772 kg) 141 lb 12.1 oz (64.3 kg)    PHYSICAL EXAM  General: Pleasant, NAD. Neuro: Alert and oriented X 3. Moves all extremities spontaneously. Psych: Normal affect. HEENT:  Normal  Neck: Supple without bruits or JVD. Lungs:  Resp regular and unlabored, CTA. Heart: RRR no s3, s4, or murmurs. Abdomen: Soft, non-tender, non-distended, BS + x 4.  Extremities: No clubbing, cyanosis or edema. DP/PT/Radials 2+ and equal bilaterally.  Accessory Clinical Findings  CBC  Recent Labs  09/18/13 0930 09/19/13 0404  WBC  --  9.2  HGB  --  13.4  HCT  --  40.6  MCV  --  89.0  PLT 195 024   Basic Metabolic Panel  Recent Labs  09/19/13 0404  NA 138  K 3.8  CL 101  CO2 26  GLUCOSE 89  BUN 26*  CREATININE 1.10  CALCIUM 8.9    TELE  NSR with some PACs  Radiology/Studies  Cardiac Catheterization Procedure Note  Name: Alfred [name]  MRN: [id number]  DOB: 08-26-1929  Procedure: Right Heart Cath, Left Heart Cath, Selective Coronary Angiography, aortic root angiography  Indication: Severe aortic stenosis, ascending aortic aneurysm  Procedural Details:  There was an indwelling IV in a right antecubital vein. Using normal sterile technique, the IV was changed out for a 5 Fr brachial sheath over a 0.018 inch wire. The right wrist was then prepped, draped, and anesthetized with 1% lidocaine. Using the modified Seldinger technique a 5/6 French Slender sheath was placed in the right radial artery. Intra-arterial verapamil was administered through the radial artery sheath. IV heparin was administered after a JR4 catheter was advanced into the central aorta. A Swan-Ganz catheter was initially used for the right heart catheterization but I was unable to navigate this catheter into the RV/PA. I was able to use a JR4 and angled glidewire to advance into the PA and perform right heart cath. Standard protocol was followed for recording of right heart pressures and sampling of oxygen saturations. Fick cardiac output was calculated. Standard Judkins catheters were used for selective coronary angiography and aortic root angiography. The patient went into atrial fibrillation during the procedure. He is initially asymptomatic with this. The patient was transferred to the post catheterization recovery area for further monitoring.  Procedural Findings:  Hemodynamics  RA 4  RV 23/2  PA 23/7 mean 11  PCWP not recorded  LV not recorded  AO 107/53  Oxygen saturations:  PA 69  AO 96  Cardiac Output (Fick) 4.5  Cardiac Index (Fick) 2.5  Coronary angiography:  Coronary dominance: left  Left mainstem: The left main is short, heavily calcified, with minor 20% stenosis noted.  Left anterior descending (LAD): The LAD is moderately calcified. There is diffuse 60-70% stenosis throughout the mid LAD. The vessel wraps around the left ventricular apex. The first septal perforator is patent. There is a tiny diagonal branch arising from the LAD.  Left circumflex (LCx): The left circumflex is dominant. The vessel is large in caliber. The proximal vessel has 40% irregular stenosis.  There is an intermediate branch without significant disease. The obtuse marginal branches are patent without significant disease. The left PDA branch is patent without significant disease.  Right coronary artery (RCA): Small, nondominant vessel supplying 2 small RV marginal  Aortic root angiography: The aortic valve has a very severe calcification with restricted motion. There is mild aortic insufficiency. The aortic root and ascending aorta are dilated.  Estimated Blood Loss: Minimal  Final Conclusions:  1. Known severe aortic stenosis with a severely calcified and restricted aortic valve on plain fluoroscopy  2. Mild to moderate CAD with moderate mid LAD stenosis and nonobstructive left circumflex stenosis and a left dominant coronary system  3. Normal right heart pressures  4. Ascending aortic aneurysm  Recommendations: the patient went into atrial fibrillation during the procedure. His heart rate is erratic but ranging from 70-110 beats per minute. Considering his severe aortic stenosis, I think he should be admitted for observation. He will be started on low-dose IV diltiazem and IV heparin. Plan elective cardioversion tomorrow morning if he does not convert spontaneously. Since his atrial fib onset was witnessed in the cardiac Cath Lab, I do not think he will require long-term anticoagulation unless he has recurrence.    ASSESSMENT AND PLAN  Alfred [name] is a 80 y.o. male with a history of severe AS, bronchogenic carcinoma, thoracic aortic aneurysm (5.5 cm), COPD, HTN and HLD who presented to [facility] on 09/18/13 for elective LHC. He went into atrial fibrillation with RVR during procedure so it was elected to admit him for observation over night.   AS: s/p LHC 09/18/13 to evaluate coronary arteries into further define his aortic stenosis. He's had an echocardiogram performed at the [facility] but am unable to find the study on our system. The peak aortic valve gradient is 77 with a mean rate  of 53 according to the echo report. Final Conclusions:   1. Known severe aortic stenosis with a severely calcified and restricted aortic valve on plain fluoroscopy   2. Mild to moderate CAD with moderate mid LAD stenosis and nonobstructive left circumflex stenosis and a left dominant coronary system   3. Normal right heart pressures   4. Ascending aortic aneurysm   Atrial fibrillation with RVR- spontaneously converted to NSR on diltiazem. The patient went into atrial fibrillation during the procedure. His heart rate is erratic but ranging from 70-110 beats per minute. Considering his severe aortic stenosis, I think he should be admitted for observation. He was started on low-dose IV diltiazem and IV heparin. Planned for elective cardioversion this morning if he does not convert spontaneously. Since his atrial fib onset was witnessed in the cardiac Cath Lab, Dr. Burt Knack did not think he will require long-term anticoagulation unless he has recurrence.  -- Currently on heparin gtt. Will likely not need long term anticoagulation.  MD for final recs.   Bronchogenic carcinemia- followed by multidisciplinary thoracic oncology clinic with Dr. Servando Snare and Dr. Acie Fredrickson. LHC part of pre - op work up. If the determine that he is a surgical candidate he should probably have resection of his lung cancer, repair of his aortic aneurysm and aortic valve with possible coronary bypass grafting at the same time. This would be a very large operation and the final decision on this issue will be up to Dr. Servando Snare. -- Has an appointment this thursday  CAD- non obstructive per cath yesterday.  -- Continue ASA and statin. BP cannot tolerate BB  Hypotension- patients pressures have been consistently soft. On no anti-hypertensives -- Continue to monitor.   AKI- resolving. 1.46--> 1.1  Tyrell Antonio PA-C  Pager [phone]   Patient seen and examined. Agree with assessment and plan. Reviewed cath data with  patient. Severe AS by prior echo at [facility]. Mild - mod CAD at cath. Pt has converted to sinus rhythm. Will dc today; and start low dose oral cardizem. Pt has an appointment in multidisciplinary clinic on Thursday.   Troy Sine, MD, [facility] 09/19/2013 9:39 AM

## 2013-09-19 NOTE — Discharge Instructions (Signed)

## 2013-09-19 NOTE — Progress Notes (Signed)
ANTICOAGULATION CONSULT NOTE - Follow Up Consult  Pharmacy Consult for heparin Indication: atrial fibrillation  Labs:  Recent Labs  09/18/13 0930 09/19/13 0404  HGB  --  13.4  HCT  --  40.6  PLT 195 185  HEPARINUNFRC  --  0.10*    Assessment: 80yo male subtherapeutic on heparin with initial dosing for new Afib.  Goal of Therapy:  Heparin level 0.3-0.7 units/ml   Plan:  Will increase heparin gtt by 3-4 units/kg/hr to 1100 units/hr and check level in [place], PharmD, BCPS  09/19/2013,5:10 AM

## 2013-09-19 NOTE — Progress Notes (Signed)
Patient discharged home with family. Discharge instructions given and reviewed with patient.

## 2013-09-19 NOTE — Discharge Summary (Signed)
Discharge Summary   Patient ID: Alfred [name] MRN: [id number], DOB/AGE: 07/26/1929 80 y.o. Admit date: 09/18/2013 D/C date:     09/19/2013  Primary Cardiologist: Dr. Acie Fredrickson  Principal Problem:   Aortic stenosis, severe Active Problems:   Hypercholesteremia   Hypertension   PVD (peripheral vascular disease)   Thoracic aortic aneurysm without rupture   Atrial fibrillation   CAD (coronary artery disease)   Tobacco abuse   Bronchogenic cancer of left lung   Discharge Diagnosis: Bronchogenic carcinoma, critical AS and TAA admitted for [name] to delineate coronary anatomy and further define AS. Non-obstructive CAD and severe AS. Initially, outpatient cath but admitted for one episode of Afib during heart cath.   HPI: Alfred [name] is a 80 y.o. male with a history of severe AS, bronchogenic carcinoma, thoracic aortic aneurysm (5.5 cm), COPD, HTN and HLD who presented to [name] on 09[facility]13 for elective LHC. He went into atrial fibrillation with RVR during procedure so it was elected to admit him for observation over night.   [name] Course  AS: s/p LHC 09/18/13 to evaluate coronary arteries into further define his aortic stenosis. He's had an echocardiogram performed at the [name] but am unable to find the study on our system. The peak aortic valve gradient is 77 with a mean rate of 53 according to the echo report.  Final Conclusions of LHC:  1. Known severe aortic stenosis with a severely calcified and restricted aortic valve on plain fluoroscopy  2. Mild to moderate CAD with moderate mid LAD stenosis and nonobstructive left circumflex stenosis and a left dominant coronary system  3. Normal right heart pressures  4. Ascending aortic aneurysm   Atrial fibrillation with RVR- spontaneously converted to NSR on diltiazem. ( No hx of afib. ) -- The patient went into atrial fibrillation during his cardiac cath. His heart rate was erratic but ranging from 70-110 beats per minute. Considering  his severe aortic stenosis it was thought best that he be admitted for observation. He was started on low-dose IV diltiazem and IV heparin. It was planned for elective cardioversion this morning if he did not convert spontaneously. However, he did end up converting into NSR with no intervention.  -- Since his atrial fib onset was witnessed in the cardiac cath lab, Alfred [name] did not think he would require long-term anticoagulation unless he has recurrence.  -- Heparin gtt discontinued   Bronchogenic carcinomia- followed by multidisciplinary thoracic oncology clinic with Alfred [name]. LHC part of pre- op work up. If they determine that he is a surgical candidate he should probably have resection of his lung cancer, repair of his aortic aneurysm and aortic valve with possible coronary bypass grafting at the same time. This would be a very large operation and the final decision on this issue will be up to Alfred [name].  -- Has an appointment this thursday with Alfred [name] and radiation oncology  CAD- non obstructive per cath yesterday. ( see full report below ) -- Continue ASA and statin. BP cannot tolerate BB currently.  Thoracic aortic aneurysm (5.5 cm)- as above.   Hypotension- patients pressures have been consistently soft. On no anti-hypertensives currently. Will continue to hold at discharge.   AKI- resolving. 1.46--> 1.1   The patient has had an uncomplicated [name] course and is recovering well. The radial catheter site is stable. He has been seen by Alfred [name] today and deemed ready for discharge home. All follow-up appointments have been scheduled. Smoking  cessation was disscussed in length. Discharge medications are listed below. We will hold HCTZ due to low blood pressures.    Discharge Vitals: Blood pressure 90/45, pulse 62, temperature 97.9 F (36.6 C), temperature source Oral, resp. rate 18, height 5\' 8"  (1.727 m), weight 141 lb 12.1 oz (64.3 kg), SpO2 95.00%.  Labs: Lab Results    Component Value Date   WBC 9.2 09/19/2013   HGB 13.4 09/19/2013   HCT 40.6 09/19/2013   MCV 89.0 09/19/2013   PLT 185 09/19/2013     Recent Labs Lab 09/19/13 0404  NA 138  K 3.8  CL 101  CO2 26  BUN 26*  CREATININE 1.10  CALCIUM 8.9  GLUCOSE 89     Diagnostic Studies/Procedures   Cardiac Catheterization Procedure Note  Name: Alfred [name]  MRN: [id number]  DOB: December 19, 1929  Procedure: Right Heart Cath, Left Heart Cath, Selective Coronary Angiography, aortic root angiography  Indication: Severe aortic stenosis, ascending aortic aneurysm  Procedural Details: There was an indwelling IV in a right antecubital vein. Using normal sterile technique, the IV was changed out for a 5 Fr brachial sheath over a 0.018 inch wire. The right wrist was then prepped, draped, and anesthetized with 1% lidocaine. Using the modified Seldinger technique a 5/6 French Slender sheath was placed in the right radial artery. Intra-arterial verapamil was administered through the radial artery sheath. IV heparin was administered after a JR4 catheter was advanced into the central aorta. A Swan-Ganz catheter was initially used for the right heart catheterization but I was unable to navigate this catheter into the RV/PA. I was able to use a JR4 and angled glidewire to advance into the PA and perform right heart cath. Standard protocol was followed for recording of right heart pressures and sampling of oxygen saturations. Fick cardiac output was calculated. Standard Judkins catheters were used for selective coronary angiography and aortic root angiography. The patient went into atrial fibrillation during the procedure. He is initially asymptomatic with this. The patient was transferred to the post catheterization recovery area for further monitoring.  Procedural Findings:  Hemodynamics  RA 4  RV 23/2  PA 23/7 mean 11  PCWP not recorded  LV not recorded  AO 107/53  Oxygen saturations:  PA 69  AO 96  Cardiac Output  (Fick) 4.5  Cardiac Index (Fick) 2.5  Coronary angiography:  Coronary dominance: left  Left mainstem: The left main is short, heavily calcified, with minor 20% stenosis noted.  Left anterior descending (LAD): The LAD is moderately calcified. There is diffuse 60-70% stenosis throughout the mid LAD. The vessel wraps around the left ventricular apex. The first septal perforator is patent. There is a tiny diagonal branch arising from the LAD.  Left circumflex (LCx): The left circumflex is dominant. The vessel is large in caliber. The proximal vessel has 40% irregular stenosis. There is an intermediate branch without significant disease. The obtuse marginal branches are patent without significant disease. The left PDA branch is patent without significant disease.  Right coronary artery (RCA): Small, nondominant vessel supplying 2 small RV marginal  Aortic root angiography: The aortic valve has a very severe calcification with restricted motion. There is mild aortic insufficiency. The aortic root and ascending aorta are dilated.  Estimated Blood Loss: Minimal  Final Conclusions:  1. Known severe aortic stenosis with a severely calcified and restricted aortic valve on plain fluoroscopy  2. Mild to moderate CAD with moderate mid LAD stenosis and nonobstructive left circumflex stenosis and a left  dominant coronary system  3. Normal right heart pressures  4. Ascending aortic aneurysm  Recommendations: the patient went into atrial fibrillation during the procedure. His heart rate is erratic but ranging from 70-110 beats per minute. Considering his severe aortic stenosis, I think he should be admitted for observation. He will be started on low-dose IV diltiazem and IV heparin. Plan elective cardioversion tomorrow morning if he does not convert spontaneously. Since his atrial fib onset was witnessed in the cardiac Cath Lab, I do not think he will require long-term anticoagulation unless he has recurrence.     Discharge Medications     Medication List    STOP taking these medications       hydrochlorothiazide 25 MG tablet  Commonly known as:  HYDRODIURIL      TAKE these medications       aspirin EC 81 MG tablet  Take 81 mg by mouth daily.     CENTRUM SILVER ADULT 50+ PO  Take 1 tablet by mouth daily.     Fish Oil 1000 MG Caps  Take 1 capsule by mouth daily.     Garlic 784 MG Caps  Take 1 capsule by mouth daily.     simvastatin 80 MG tablet  Commonly known as:  ZOCOR  Take 40 mg by mouth daily.        Disposition   The patient will be discharged in stable condition to home.  Follow-up Information   Follow up with Darden Amber., MD. (Please follow up as previously scheduled .)    Specialty:  Cardiology   Contact information:   [address] [place] [address] [phone]         Duration of Discharge Encounter: Greater than 30 minutes including physician and PA time.  Signed, Vertell Limber, KATHRYN PA-C 09/19/2013, 10:09 AM

## 2013-09-21 ENCOUNTER — Ambulatory Visit: Payer: BC Managed Care – PPO | Admitting: Radiation Oncology

## 2013-09-21 ENCOUNTER — Ambulatory Visit
Admission: RE | Admit: 2013-09-21 | Discharge: 2013-09-21 | Disposition: A | Discharge status: Home / Self Care | Payer: Medicare Other | Source: Ambulatory Visit | Attending: Radiation Oncology | Admitting: Radiation Oncology

## 2013-09-21 ENCOUNTER — Encounter: Payer: Self-pay | Admitting: *Deleted

## 2013-09-21 ENCOUNTER — Ambulatory Visit: Payer: Medicare Other | Attending: Radiation Oncology | Admitting: Physical Therapy

## 2013-09-21 ENCOUNTER — Institutional Professional Consult (permissible substitution) (INDEPENDENT_AMBULATORY_CARE_PROVIDER_SITE_OTHER): Payer: Medicare Other | Admitting: Cardiothoracic Surgery

## 2013-09-21 VITALS — BP 126/73 | HR 60 | Temp 97.9°F | Resp 20 | Wt 142.7 lb

## 2013-09-21 DIAGNOSIS — R293 Abnormal posture: Secondary | ICD-10-CM | POA: Insufficient documentation

## 2013-09-21 DIAGNOSIS — C3492 Malignant neoplasm of unspecified part of left bronchus or lung: Secondary | ICD-10-CM

## 2013-09-21 DIAGNOSIS — I739 Peripheral vascular disease, unspecified: Secondary | ICD-10-CM | POA: Diagnosis not present

## 2013-09-21 DIAGNOSIS — I4891 Unspecified atrial fibrillation: Secondary | ICD-10-CM | POA: Insufficient documentation

## 2013-09-21 DIAGNOSIS — I10 Essential (primary) hypertension: Secondary | ICD-10-CM | POA: Diagnosis not present

## 2013-09-21 DIAGNOSIS — IMO0001 Reserved for inherently not codable concepts without codable children: Secondary | ICD-10-CM | POA: Diagnosis not present

## 2013-09-21 DIAGNOSIS — F17200 Nicotine dependence, unspecified, uncomplicated: Secondary | ICD-10-CM | POA: Insufficient documentation

## 2013-09-21 DIAGNOSIS — J449 Chronic obstructive pulmonary disease, unspecified: Secondary | ICD-10-CM | POA: Insufficient documentation

## 2013-09-21 DIAGNOSIS — I2510 Atherosclerotic heart disease of native coronary artery without angina pectoris: Secondary | ICD-10-CM | POA: Insufficient documentation

## 2013-09-21 DIAGNOSIS — I712 Thoracic aortic aneurysm, without rupture, unspecified: Secondary | ICD-10-CM

## 2013-09-21 DIAGNOSIS — C3490 Malignant neoplasm of unspecified part of unspecified bronchus or lung: Secondary | ICD-10-CM | POA: Insufficient documentation

## 2013-09-21 DIAGNOSIS — I38 Endocarditis, valve unspecified: Secondary | ICD-10-CM

## 2013-09-21 DIAGNOSIS — I359 Nonrheumatic aortic valve disorder, unspecified: Secondary | ICD-10-CM

## 2013-09-21 DIAGNOSIS — I350 Nonrheumatic aortic (valve) stenosis: Secondary | ICD-10-CM

## 2013-09-21 DIAGNOSIS — J4489 Other specified chronic obstructive pulmonary disease: Secondary | ICD-10-CM | POA: Insufficient documentation

## 2013-09-21 DIAGNOSIS — D381 Neoplasm of uncertain behavior of trachea, bronchus and lung: Secondary | ICD-10-CM

## 2013-09-21 NOTE — Progress Notes (Signed)
[address]       [place] [address]             [phone]                    Bassam P Pree Coos Medical Record #[id number] Date of Birth: Aug 14, 1929  Referring: Augustin Coupe, MD Primary Care: Augustin Coupe, MD  Chief Complaint:    Lung mass   History of Present Illness:    Alfred Hall 80 y.o. male is seen in the office  today for newly diagnosed left upper lobe lung mass. The patient has been a long-term smoker x60 years with underlying COPD, limited in his pulmonary reserve but not on oxygen at home. He had a known abdominal aortic aneurysms followed at [facility] but not seen since 2013. He had a recent "checkup" at the [facility] in [facility], a chest x-ray suggested a little left upper lobe lung mass. His local provider in [place] arrange CT scan of the chest , PET scan, and echocardiogram. He is referred for further evaluation and deciding on a treatment plan.  In addition to the suspicious left upper lobe lung mass, he's recently been found to have a 5.5 cm ascending aortic aneurysm, critical aortic stenosis by echocardiogram ejection fraction 40-45% peak gradient 77 mean gradient 53, and aortic valve velocity 4.4 m/s, and moderate mitral stenosis. Abdominal aortic aneurysm has increased in size from 4.6 cm in 2013 to 5.2 now.   Surprisingly the patient has few symptoms but is fairly sedentary in his lifestyle. He does care for his ill wife, does light housework grocery shopping, but does not do any yard work around his house because of shortness of breath with exertion. He denies any syncope, denies angina, denies pedal edema. His major limiting symptom is shortness of breath with exertion.  Since last seen the patient has seen cardiology and had cardiac cath. At time of cath he had onset of Afib and stayed overnight  Current Activity/ Functional Status:  Patient is independent with mobility/ambulation, transfers,  ADL's, IADL's.   Zubrod Score: At the time of surgery this patient's most appropriate activity status/level should be described as: []     0    Normal activity, no symptoms [x]     1    Restricted in physical strenuous activity but ambulatory, able to do out light work []     2    Ambulatory and capable of self care, unable to do work activities, up and about               >50 % of waking hours                              []     3    Only limited self care, in bed greater than 50% of waking hours []     4    Completely disabled, no self care, confined to bed or chair []     5    Moribund   Past Medical History  Diagnosis Date  . Lung nodule   . PVD (peripheral vascular disease)   . Microhematuria     workup negative  . Aortic aneurysm, abdominal     followed at [facility]  . Hypercholesteremia   . Hypertension   . Valvular heart disease  recent echocardiogram noted above   . Thoracic aortic aneurysm  without rupture  recently noted on CT of the chest  by my measurement 5.2 x 5.4     Past Surgical History  Procedure Laterality Date  . Cataract extraction    . Eye surgery      CATARACT    Family History  Problem Relation Age of Onset  . Renal Disease Father   . Congestive Heart Failure Mother    patient's mother died of COPD at age 34, father died at age 58 with diabetes, one sister is alive with COPD  History   Social History  . Marital Status: Married    Spouse Name: N/A    Number of Children: 3  . Years of Education: N/A   Occupational History  . retired from [employer]  . Smoking status: Current Every Day Smoker -- 0.50 packs/day for 60 years    Types: Cigarettes  . Smokeless tobacco: Never Used  . Alcohol Use: No  . Drug Use: No  . Sexual Activity: Not on file      History  Smoking status  . Current Every Day Smoker -- 0.50 packs/day for 60 years  . Types: Cigarettes  Smokeless tobacco  . Never Used    History  Alcohol Use  No     No Known Allergies  Current Outpatient Prescriptions  Medication Sig Dispense Refill  . aspirin EC 81 MG tablet Take 81 mg by mouth daily.      . Garlic 275 MG CAPS Take 1 capsule by mouth daily.      . Multiple Vitamins-Minerals (CENTRUM SILVER ADULT 50+ PO) Take 1 tablet by mouth daily.      . Omega-3 Fatty Acids (FISH OIL) 1000 MG CAPS Take 1 capsule by mouth daily.      . simvastatin (ZOCOR) 80 MG tablet Take 40 mg by mouth daily.        No current facility-administered medications for this visit.     Review of Systems:     Cardiac Review of Systems: Y or N  Chest Pain [  n  ]  Resting SOB [n   ] Exertional SOB  Blue.Reese  ]  Orthopnea Florencio.Farrier  ]   Pedal Edema [ n  ]    Palpitations [ n ] Syncope  [n  ]   Presyncope [n   ]  General Review of Systems: [Y] = yes [  ]=no Constitional: recent weight change [n  ];  Wt loss over the last 3 months [   ] anorexia [  ]; fatigue Blue.Reese  ]; nausea [n  ]; night sweats [ n ]; fever [n  ]; or chills [n  ];          Dental: poor dentition[ dentures ]; Last Dentist visit:   Eye : blurred vision [  ]; diplopia [   ]; vision changes [  ];  Amaurosis fugax[  ]; Resp: cough [  ];  wheezing[  ];  hemoptysis[  ]; shortness of breath[ y ]; paroxysmal nocturnal dyspnea[ n ]; dyspnea on exertion[ y ]; or orthopnea[ n ];  GI:  gallstones[  ], vomiting[n  ];  dysphagia[  ]; melena[  ];  hematochezia [  ]; heartburn[  ];   Hx of  Colonoscopy[  ]; GU: kidney stones [n  ]; hematuria[ n ];   dysuria [  ];  nocturia[  ];  history of     obstruction [  ]; urinary frequency [  ]  Skin: rash, swelling[  ];, hair loss[  ];  peripheral edema[ n ];  or itching[  ]; Musculosketetal: myalgias[  ];  joint swelling[  ];  joint erythema[  ];  joint pain[  ];  back pain[  ];  Heme/Lymph: bruising[ y ];  bleeding[  ];  anemia[  ];  Neuro: TIA[  ];  headaches[  ];  stroke[  ];  vertigo[  ];  seizures[  ];   paresthesias[  ];  difficulty walking[ n ];  Psych:depression[   ]; anxiety[  ];  Endocrine: diabetes[n  ];  thyroid dysfunction[n  ];  Immunizations: Flu up to date Blue.Reese  ]; Pneumococcal up to date [ y ];  Other:  Physical Exam: BP 126/73  Pulse 60  Temp(Src) 97.9 F (36.6 C)  Resp 20  Wt 142 lb 11.2 oz (64.728 kg)  SpO2 97%  PHYSICAL EXAMINATION:  General appearance: alert, cooperative, appears stated age and no distress Neurologic: intact Heart: regular rate and rhythm and systolic murmur: holosystolic 4/6, crescendo throughout the precordium Lungs: clear to auscultation bilaterally Abdomen: soft, non-tender; bowel sounds normal; no masses,  no organomegaly Extremities: extremities normal, atraumatic, no cyanosis or edema and Homans sign is negative, no sign of DVT Patient has no cervical or supraclavicular adenopathy   Diagnostic Studies & Laboratory data:     Recent Radiology Findings:    CT THORAX(CHEST) W/IV CONT. . Aug 23, 2013 10:44:49 AM HISTORY: left lobe mass 518.89 OTHER DISEASES OF LUNG, NOT ELSEWHERE CLASSIFIED COMPARISON: None TECHNIQUE: CT of the chest was performed following IV contrast administration (116ml Isovue).  FINDINGS: Mediastinum: 1.5 cm right thyroid nodule with internal calcifications . Ascending aorta is dilated measuring 5.1 cm. Severe aortic valve plane calcifications are present. There is severe descending aortic atherosclerosis with ulcerated plaques image 57. Esophagus appears normal. No mm adenopathy.  Limited abdomen: Simple attenuation left renal cyst. Otherwise negative.  Musculoskeletal: Bones osteopenic. Minimal spinal curvature.  Lungs: Polyps or more likely extensive debris within the trachea. Severe emphysema. Mild bronchial wall thickening. 2.0 cm spiculated nodule in the left upper lobe. There is a small amount of adjacent pneumonitis. This abuts the major fissure and is distant from the carina. No effusion.  Impression: 1. 2.0 cm nodule in the left upper lobe compatible with bronchogenic  carcinoma. No adenopathy by size criteria or sign of distant metastasis. 2. Aortic stenosis with aneurysmal dilatation of the ascending aorta. 3. Severe emphysema. 4. Severe atherosclerosis. 1.57 calcified right thyroid nodule.. 5. Polyps or debris within the trachea. Debris is favored.   NM PET/CT SKULL BASE TO MID THIGH. Aug 28, 2013 08:30:00 PM  Clinical Information: 793.11 SOLITARY PULMONARY NODULE 2CT  Radiopharmaceutical: 12.60 mCi of F-18 Fluorodeoxyglucose, IV.  Technique: Images from skull base to proximal femora were obtained and reconstructed in the sagittal, coronal and axial planes. CT localizer scan was performed with CT fusion images evaluated in the workstation.  The patient's blood Glucose level measures 96 mg/dl. The patient stands 5 ft 8 inches and weighs 145 lbs. Uptake time: 60 min. CTDI: 6.39 mGy.  Findings: There is no prior PET scan for comparison. The 2 cm spiculated nodule in in the left upper lobe shows marked FDG accumulation with a mean uptake of 14.20, maximal 20.95 SUV by bodyweight. No mediastinal or hilar nodal uptake is seen. There is minimal uptake in the oral pharyngeal area which is physiologic. There is also physiologic uptake within the myocardium, the liver, spleen, kidneys with tracer accumulation  within the urinary bladder. Activity with the bowel lumen appears to be luminal.  There is an aneurysm of the ascending aorta measuring 5.9 cm x 5.3 cm. A right parapelvic cyst is present. There is a 5 cm low density oval mass in the upper pole of the left kidney with a mean attenuation value of 13 Hounsfield units. Cholecystectomy clips are seen. There is a large infrarenal abdominal aneurysm which does not extend into the bifurcation measuring 5.2 cm x 5.1 cm. Mild dilatation of the right common iliac artery is seen. The prostate gland is enlarged with some coarse calcification within it. Possible hydroceles in the scrotum.  Impression: 1. Marked FDG uptake  in the left upper lobe nodule compatible with a malignant neoplastic process. 2. No definite evidence of mediastinal or hilar metastases. No suspicious uptake in the neck, abdomen or pelvis. 3. There is an aneurysm of the ascending aorta measuring 5.9 cm x 5.3 cm. Large infrarenal abdominal aortic aneurysm measures 5.2 cm x 5.1 cm. 4. Mild dilatation of the proximal right common iliac artery. 5. Enlarged prostate gland. There may be hydrocele in the scrotum.   Cardiac CAth:' Procedural Findings:  Hemodynamics  RA 4  RV 23/2  PA 23/7 mean 11  PCWP not recorded  LV not recorded  AO 107/53  Oxygen saturations:  PA 69  AO 96  Cardiac Output (Fick) 4.5  Cardiac Index (Fick) 2.5  Coronary angiography:  Coronary dominance: left  Left mainstem: The left main is short, heavily calcified, with minor 20% stenosis noted.  Left anterior descending (LAD): The LAD is moderately calcified. There is diffuse 60-70% stenosis throughout the mid LAD. The vessel wraps around the left ventricular apex. The first septal perforator is patent. There is a tiny diagonal branch arising from the LAD.  Left circumflex (LCx): The left circumflex is dominant. The vessel is large in caliber. The proximal vessel has 40% irregular stenosis. There is an intermediate branch without significant disease. The obtuse marginal branches are patent without significant disease. The left PDA branch is patent without significant disease.  Right coronary artery (RCA): Small, nondominant vessel supplying 2 small RV marginal  Aortic root angiography: The aortic valve has a very severe calcification with restricted motion. There is mild aortic insufficiency. The aortic root and ascending aorta are dilated.  Estimated Blood Loss: Minimal  Final Conclusions:  1. Known severe aortic stenosis with a severely calcified and restricted aortic valve on plain fluoroscopy  2. Mild to moderate CAD with moderate mid LAD stenosis and nonobstructive  left circumflex stenosis and a left dominant coronary system  3. Normal right heart pressures  4. Ascending aortic aneurysm      Recent Lab Findings: Lab Results  Component Value Date   WBC 9.2 09/19/2013      Assessment / Plan:   Patient presents with new discovery of clinical stage I carcinoma of the left upper lobe, in the setting of 80 years old, current smoker, critical aortic stenosis, significantly dilated ascending and abdominal aorta, with unknown coronary artery status, and CT scan of the chest suggestive of significant pulmonary disease. I discussed the radiographic findings and other newly discovered medical problems with the patient and his daughter.  I have discussed with the patient and his daughter options including cabg,root replacement, avr, left upper lobectomy vs needle bx of lesion and proceeding with radiation therapy for the lung lesion . With age and poor pft likely just treatment of lung cancer with about a large operation  is more indicated. Patient is agreeable Will arrange needle bx of left lung lesion and radiation therapy if malignant   Grace Isaac MD      [address] [place] [address] Office [phone]   Beeper [phone]  09/21/2013 5:43 PM

## 2013-09-22 ENCOUNTER — Other Ambulatory Visit: Payer: Self-pay | Admitting: *Deleted

## 2013-09-22 DIAGNOSIS — R911 Solitary pulmonary nodule: Secondary | ICD-10-CM

## 2013-09-22 NOTE — Progress Notes (Signed)
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 male  Who was being worked up by the [employer] [facility] for AAA when a CXR showed a left upper lobe mass. He has ahd a CT, PET and echocardiogram as part of his workup at an outside institution. These studies were reviewed in coference today and he has a 3 cm right upper lobe mass which is hypermetabolic.  He has no abnormal lymph nodes and no evidence of metastatic disease. He has not had brain imaging. He is not an operative candidate due to multiple other medical problems including a 5.5 cm AAA, aortic stenosis and atrial fibrillation. PFTs showed FEV1 of 1.49 and DLCO 12.74, 42% predicted. He is asymptomatic. He denies any hemoptysis. He has stable dypsnea on exertion. He cares for his elderly wife in [place], [place].  His daughter is moving here to [facility] to start a job at [employer] on Monday. He has not had a biopsy  PREVIOUS RADIATION THERAPY: No  PAST MEDICAL HISTORY:  has a past medical history of Bronchogenic cancer of left lung; PVD (peripheral vascular disease); Microhematuria; Aortic aneurysm, abdominal; Hypercholesteremia; Hypertension; Thoracic aortic aneurysm without rupture; Aortic stenosis; Tobacco abuse; and CAD (coronary artery disease).    PAST SURGICAL HISTORY: Past Surgical History  Procedure Laterality Date  . Cataract extraction    . Eye surgery      CATARACT    FAMILY HISTORY:  Family History  Problem Relation Age of Onset  . Renal Disease Father   . Congestive Heart Failure Mother     SOCIAL HISTORY:  History  Substance Use Topics  . Smoking status: Current Every Day Smoker -- 0.50 packs/day for 60 years    Types: Cigarettes  .  Smokeless tobacco: Never Used  . Alcohol Use: No    ALLERGIES: Review of patient's allergies indicates no known allergies.  MEDICATIONS:  Current Outpatient Prescriptions  Medication Sig Dispense Refill  . aspirin EC 81 MG tablet Take 81 mg by mouth daily.      . Garlic 027 MG CAPS Take 1 capsule by mouth daily.      . Multiple Vitamins-Minerals (CENTRUM SILVER ADULT 50+ PO) Take 1 tablet by mouth daily.      . Omega-3 Fatty Acids (FISH OIL) 1000 MG CAPS Take 1 capsule by mouth daily.      . simvastatin (ZOCOR) 80 MG tablet Take 40 mg by mouth daily.        No current facility-administered medications for this encounter.    REVIEW OF SYSTEMS:  A 15 point review of systems is documented in the electronic medical record. This was obtained by the nursing staff. However, I reviewed this with the patient to discuss relevant findings and make appropriate changes.  Pertinent items are noted in HPI.  PHYSICAL EXAM:  Filed Vitals:   09/21/13 1551  BP: 126/73  Pulse: 60  Temp: 97.9 F (36.6 C)  Resp: 20  .142 lb 11.2 oz (64.728 kg). Pleasant male. No respiratory distress. Normal mood and affect. Alert and oriented.   LABORATORY DATA:  Lab Results  Component Value Date   WBC 9.2 09/19/2013   HGB 13.4 09/19/2013   HCT 40.6 09/19/2013  MCV 89.0 09/19/2013   PLT 185 09/19/2013   Lab Results  Component Value Date   NA 138 09/19/2013   K 3.8 09/19/2013   CL 101 09/19/2013   CO2 26 09/19/2013   No results found for this basename: ALT, AST, GGT, ALKPHOS, BILITOT     RADIOGRAPHY: See HPI    IMPRESSION: T2N0 Likely NSCLC of the left upper lobe   PLAN:We discussed that he is a poor operative candidate and could be a good radiation candidate.  This is large and with movement will be close to his aorta so I would recommend fractionated radiation rather than SBRT.  We will try to do this in as few fractions as possible as it will require him to travel and stay with his daughter which will be inconvenient  given his wife's status.  We discussed possible side effects including but not limited to skin redness, fatigue, shoulder soreness and damage to any normal structures within the chest.  Dr. Servando Snare has arranged for biopsy and I will see him after that to begin treatment planning.  Overall, I think he will tolerate treatment well.  I spent 60 minutes  face to face with the patient and more than 50% of that time was spent in counseling and/or coordination of care.   ------------------------------------------------  Thea Silversmith, MD

## 2013-09-28 ENCOUNTER — Other Ambulatory Visit: Payer: Self-pay | Admitting: Radiology

## 2013-09-29 ENCOUNTER — Ambulatory Visit (HOSPITAL_COMMUNITY)
Admission: RE | Admit: 2013-09-29 | Discharge: 2013-09-29 | Disposition: A | Discharge status: Home / Self Care | Payer: Medicare Other | Source: Ambulatory Visit | Attending: Cardiothoracic Surgery | Admitting: Cardiothoracic Surgery

## 2013-09-29 ENCOUNTER — Ambulatory Visit (HOSPITAL_COMMUNITY)
Admission: RE | Admit: 2013-09-29 | Discharge: 2013-09-29 | Disposition: A | Discharge status: Home / Self Care | Payer: Medicare Other | Source: Ambulatory Visit | Attending: Interventional Radiology | Admitting: Interventional Radiology

## 2013-09-29 ENCOUNTER — Encounter (HOSPITAL_COMMUNITY): Payer: Self-pay

## 2013-09-29 VITALS — BP 116/66 | HR 46 | Temp 98.1°F | Resp 18 | Ht 68.0 in | Wt 142.0 lb

## 2013-09-29 DIAGNOSIS — J984 Other disorders of lung: Secondary | ICD-10-CM | POA: Diagnosis present

## 2013-09-29 DIAGNOSIS — R911 Solitary pulmonary nodule: Secondary | ICD-10-CM

## 2013-09-29 DIAGNOSIS — Z9889 Other specified postprocedural states: Secondary | ICD-10-CM

## 2013-09-29 DIAGNOSIS — R222 Localized swelling, mass and lump, trunk: Secondary | ICD-10-CM | POA: Diagnosis not present

## 2013-09-29 LAB — CBC
HCT: 41.2 % (ref 39.0–52.0)
Hemoglobin: 13.6 g/dL (ref 13.0–17.0)
MCH: 29.3 pg (ref 26.0–34.0)
MCHC: 33 g/dL (ref 30.0–36.0)
MCV: 88.8 fL (ref 78.0–100.0)
PLATELETS: 208 10*3/uL (ref 150–400)
RBC: 4.64 MIL/uL (ref 4.22–5.81)
RDW: 14.1 % (ref 11.5–15.5)
WBC: 8.4 10*3/uL (ref 4.0–10.5)

## 2013-09-29 LAB — PROTIME-INR
INR: 1.1 (ref 0.00–1.49)
Prothrombin Time: 14.2 seconds (ref 11.6–15.2)

## 2013-09-29 LAB — APTT: aPTT: 35 seconds (ref 24–37)

## 2013-09-29 MED ORDER — FENTANYL CITRATE 0.05 MG/ML IJ SOLN
INTRAMUSCULAR | Status: AC | PRN
  Administered 2013-09-29: 25 ug via INTRAVENOUS
  Administered 2013-09-29: 50 ug via INTRAVENOUS

## 2013-09-29 MED ORDER — FENTANYL CITRATE 0.05 MG/ML IJ SOLN
INTRAMUSCULAR | Status: AC
  Filled 2013-09-29: qty 2

## 2013-09-29 MED ORDER — SODIUM CHLORIDE 0.9 % IV SOLN
Freq: Once | INTRAVENOUS | Status: AC
  Administered 2013-09-29: 1000 mL via INTRAVENOUS

## 2013-09-29 MED ORDER — LIDOCAINE HCL 1 % IJ SOLN
INTRAMUSCULAR | Status: AC
  Filled 2013-09-29: qty 10

## 2013-09-29 MED ORDER — MIDAZOLAM HCL 2 MG/2ML IJ SOLN
INTRAMUSCULAR | Status: AC
  Filled 2013-09-29: qty 2

## 2013-09-29 MED ORDER — MIDAZOLAM HCL 2 MG/2ML IJ SOLN
INTRAMUSCULAR | Status: AC | PRN
  Administered 2013-09-29: 1 mg via INTRAVENOUS

## 2013-09-29 NOTE — H&P (Signed)
Alfred Hall is an 80 y.o. male.   Chief Complaint: Pt [facility] CXR reveals L Lung mass Further work up ensued CT confirms findings; +PET 08/2013 Also shows Rt thyroid calcific nodule +smoker Dr Servando Snare requests L lung mass biopsy  HPI: AAA; Ao stenosis; PVD; HLD; HTN; smoker; CAD  Past Medical History  Diagnosis Date  . Bronchogenic cancer of left lung     a. followed by multidisciplinary thoracic oncology clinic  . PVD (peripheral vascular disease)   . Microhematuria     a. workup negative  . Aortic aneurysm, abdominal   . Hypercholesteremia   . Hypertension   . Thoracic aortic aneurysm without rupture   . Aortic stenosis     a. ECHO at [place]- pk AV grad 77 with a mean rate of 53   . Tobacco abuse   . CAD (coronary artery disease)     a. s/p LHC on 09/18/13 with non-obs disease and severe AS    Past Surgical History  Procedure Laterality Date  . Cataract extraction    . Eye surgery      CATARACT    Family History  Problem Relation Age of Onset  . Renal Disease Father   . Congestive Heart Failure Mother    Social History:  reports that he has been smoking Cigarettes.  He has a 30 pack-year smoking history. He has never used smokeless tobacco. He reports that he does not drink alcohol or use illicit drugs.  Allergies: No Known Allergies   (Not in a hospital admission)  Results for orders placed during the hospital encounter of 09/29/13 (from the past 48 hour(s))  APTT     Status: None   Collection Time    09/29/13  9:29 AM      Result Value Ref Range   aPTT 35  24 - 37 seconds  CBC     Status: None   Collection Time    09/29/13  9:29 AM      Result Value Ref Range   WBC 8.4  4.0 - 10.5 K/uL   RBC 4.64  4.22 - 5.81 MIL/uL   Hemoglobin 13.6  13.0 - 17.0 g/dL   HCT 41.2  39.0 - 52.0 %   MCV 88.8  78.0 - 100.0 fL   MCH 29.3  26.0 - 34.0 pg   MCHC 33.0  30.0 - 36.0 g/dL   RDW 14.1  11.5 - 15.5 %   Platelets 208  150 - 400 K/uL   PROTIME-INR     Status: None   Collection Time    09/29/13  9:29 AM      Result Value Ref Range   Prothrombin Time 14.2  11.6 - 15.2 seconds   INR 1.10  0.00 - 1.49   No results found.  Review of Systems  Constitutional: Positive for weight loss. Negative for fever and chills.  Respiratory: Positive for shortness of breath.   Cardiovascular: Negative for chest pain.  Gastrointestinal: Negative for nausea, vomiting and abdominal pain.  Musculoskeletal: Negative for back pain.  Neurological: Positive for weakness. Negative for headaches.  Psychiatric/Behavioral: Positive for substance abuse.       Smoker     Blood pressure 146/61, pulse 56, temperature 98.1 F (36.7 C), temperature source Oral, resp. rate 18, height 5\' 8"  (1.727 m), weight 64.411 kg (142 lb), SpO2 100.00%. Physical Exam  Constitutional: He is oriented to person, place, and time. He appears well-developed.  Cardiovascular: Normal rate and regular rhythm.   Murmur heard. Respiratory: Effort normal and breath sounds normal. He has no wheezes.  GI: Soft. Bowel sounds are normal. There is no tenderness.  Musculoskeletal: Normal range of motion.  Neurological: He is alert and oriented to person, place, and time.  Skin: Skin is warm and dry.  Psychiatric: He has a normal mood and affect. His behavior is normal. Judgment and thought content normal.     Assessment/Plan LUL mass; +PET Smoker Scheduled now for biopsy Pt aware of procedure benefits and risks and agreeable to proceed Consent signed and in chart  Adore Kithcart A 09/29/2013, 10:59 AM

## 2013-09-29 NOTE — Discharge Instructions (Signed)
Needle Biopsy of Lung, Care After °Refer to this sheet in the next few weeks. These instructions provide you with information on caring for yourself after your procedure. Your health care provider may also give you more specific instructions. Your treatment has been planned according to current medical practices, but problems sometimes occur. Call your health care provider if you have any problems or questions after your procedure. °WHAT TO EXPECT AFTER THE PROCEDURE °· A bandage will be applied over the area where the needle was inserted. You may be asked to apply pressure to the bandage for several minutes to ensure there is minimal bleeding. °· In most cases, you can leave when your needle biopsy procedure is completed. Do not drive yourself home. Someone else should take you home. °· If you received an IV sedative or general anesthetic, you will be taken to a comfortable place to relax while the medicine wears off. °· If you have upcoming travel scheduled, talk to your health care provider about when it is safe to travel by air after the procedure. °HOME CARE INSTRUCTIONS °· Expect to take it easy for the rest of the day. °· Protect the area where you received the needle biopsy by keeping the bandage in place for as long as instructed. °· You may feel some mild pain or discomfort in the area, but this should stop in a day or two. °· Take medicines only as directed by your health care provider. °SEEK MEDICAL CARE IF:  °· You have pain at the biopsy site that worsens or is not helped by medicine. °· You have swelling or drainage at the needle biopsy site. °· You have a fever. °SEEK IMMEDIATE MEDICAL CARE IF:  °· You have new or worsening shortness of breath. °· You have chest pain. °· You are coughing up blood. °· You have bleeding that does not stop with pressure or a bandage. °· You develop light-headedness or fainting. °Document Released: 11/30/2006 Document Revised: 06/19/2013 Document Reviewed:  06/27/2012 °ExitCare® Patient Information ©2015 [employer]. This information is not intended to replace advice given to you by your health care provider. Make sure you discuss any questions you have with your health care provider. ° °

## 2013-09-29 NOTE — H&P (Signed)
Agree.  For left lung mass biopsy under CT guidance.

## 2013-09-29 NOTE — Procedures (Signed)
Procedure:  CT guided core biopsy of LUL lung mass Findings:  18 G core biopsy x 2 via 17 G needle of LUL mass Blood patch performed w/ 8 mL blood. No PTX on post biopsy imaging. Plan:  CXR at 1430

## 2013-10-10 ENCOUNTER — Institutional Professional Consult (permissible substitution): Payer: Medicare Other | Admitting: Cardiology

## 2013-10-11 ENCOUNTER — Ambulatory Visit (INDEPENDENT_AMBULATORY_CARE_PROVIDER_SITE_OTHER): Payer: Medicare Other | Admitting: Cardiothoracic Surgery

## 2013-10-11 ENCOUNTER — Encounter: Payer: Self-pay | Admitting: Cardiothoracic Surgery

## 2013-10-11 VITALS — BP 112/70 | HR 75 | Resp 16 | Ht 68.0 in | Wt 142.0 lb

## 2013-10-11 DIAGNOSIS — Z9889 Other specified postprocedural states: Secondary | ICD-10-CM

## 2013-10-11 DIAGNOSIS — D381 Neoplasm of uncertain behavior of trachea, bronchus and lung: Secondary | ICD-10-CM

## 2013-10-11 DIAGNOSIS — R222 Localized swelling, mass and lump, trunk: Secondary | ICD-10-CM

## 2013-10-11 DIAGNOSIS — R918 Other nonspecific abnormal finding of lung field: Secondary | ICD-10-CM

## 2013-10-11 NOTE — Progress Notes (Signed)
Alfred Hall [address]       Alfred Hall,Alfred Hall [address]             [phone]                    Alfred Hall [facility] Medical Record #[id number] Date of Birth: 09-05-1929  Referring: Alfred Coupe, MD Primary Care: Alfred Coupe, MD  Chief Complaint:    Lung mass   History of Present Illness:    Alfred Hall 80 y.o. male is seen in the office  today for newly diagnosed left upper lobe lung mass. The patient has been a long-term smoker x60 years with underlying COPD, limited in his pulmonary reserve but not on oxygen at home. He had a known abdominal aortic aneurysms followed at [facility] but not seen since 2013. He had a recent "checkup" at the [facility] in [facility], a chest x-ray suggested a little left upper lobe lung mass. His local provider in [place] arrange CT scan of the chest , PET scan, and echocardiogram. He is referred for further evaluation and deciding on a treatment plan.  In addition to the suspicious left upper lobe lung mass, he's recently been found to have a 5.5 cm ascending aortic aneurysm, critical aortic stenosis by echocardiogram ejection fraction 40-45% peak gradient 77 mean gradient 53, and aortic valve velocity 4.4 m/s, and moderate mitral stenosis. Abdominal aortic aneurysm has increased in size from 4.6 cm in 2013 to 5.2 now.   Surprisingly the patient has few symptoms but is fairly sedentary in his lifestyle. He does care for his ill wife, does light housework grocery shopping, but does not do any yard work around his house because of shortness of breath with exertion. He denies any syncope, denies angina, denies pedal edema. His major limiting symptom is shortness of breath with exertion.  Since last seen the patient has seen cardiology and had cardiac cath. At time of cath he had onset of Afib and stayed overnight  Patient comes in after needle bx of the left upper lobe lung  Needle bx of lung lesion done: Lung,  needle/core biopsy(ies), left upper lobe - FIBROSIS WITH PATCHY LYMPHOID AGGREGATES. - SEE MICROSCOPIC DESCRIPTION. Microscopic Comment The airspaces are mostly replaced by variably dense fibrosis with mild associated inflammation. There are patchy lymphoplasmacytic aggregates predominately around terminal airspaces. Immunohistochemistry shows a predominance of CD20 and CD79a positive B cells with a mixture of peripheral CD3 and CD5 positive T cells. CD10, CD30 and cyclin D1 are negative. There are also a few vessels with peripheral fibrosis and inflammatory cells within the wall of the vessel. The features favor an inflammatory fibrosing process and clinical correlation is essential. Recurrent carcinoma is not identified in these core biopsies. Dr. Gari Crown has reviewed this case and agrees. (JDP:gt, 10/03/13) [name] MD Pathologist, Electronic Signature (Case signed 10/03/2013)  Current Activity/ Functional Status:  Patient is independent with mobility/ambulation, transfers, ADL's, IADL's.   Zubrod Score: At the time of surgery this patient's most appropriate activity status/level should be described as: []     0    Normal activity, no symptoms [x]     1    Restricted in physical strenuous activity but ambulatory, able to do out light work []     2    Ambulatory and capable of self care, unable to do work activities, up and about               >  50 % of waking hours                              []     3    Only limited self care, in bed greater than 50% of waking hours []     4    Completely disabled, no self care, confined to bed or chair []     5    Moribund   Past Medical History  Diagnosis Date  . Lung nodule   . PVD (peripheral vascular disease)   . Microhematuria     workup negative  . Aortic aneurysm, abdominal     followed at [facility]  . Hypercholesteremia   . Hypertension   . Valvular heart disease  recent echocardiogram noted above   . Thoracic aortic aneurysm without  rupture  recently noted on CT of the chest  by my measurement 5.2 x 5.4     Past Surgical History  Procedure Laterality Date  . Cataract extraction    . Eye surgery      CATARACT    Family History  Problem Relation Age of Onset  . Renal Disease Father   . Congestive Heart Failure Mother    patient's mother died of COPD at age 53, father died at age 56 with diabetes, one sister is alive with COPD  History   Social History  . Marital Status: Married    Spouse Name: N/A    Number of Children: 3  . Years of Education: N/A   Occupational History  . retired from [employer]  . Smoking status: Current Every Day Smoker -- 0.50 packs/day for 60 years    Types: Cigarettes  . Smokeless tobacco: Never Used  . Alcohol Use: No  . Drug Use: No  . Sexual Activity: Not on file      History  Smoking status  . Current Every Day Smoker -- 0.50 packs/day for 60 years  . Types: Cigarettes  Smokeless tobacco  . Never Used    History  Alcohol Use No     No Known Allergies  Current Outpatient Prescriptions  Medication Sig Dispense Refill  . aspirin EC 81 MG tablet Take 81 mg by mouth daily.      . Garlic 992 MG CAPS Take 300 mg by mouth daily.       . Multiple Vitamins-Minerals (CENTRUM SILVER ADULT 50+ PO) Take 1 tablet by mouth daily.      . simvastatin (ZOCOR) 80 MG tablet Take 40 mg by mouth at bedtime.        No current facility-administered medications for this visit.     Review of Systems:     Cardiac Review of Systems: Y or N  Chest Pain [  n  ]  Resting SOB [n   ] Exertional SOB  Blue.Reese  ]  Orthopnea Florencio.Farrier  ]   Pedal Edema [ n  ]    Palpitations [ n ] Syncope  [n  ]   Presyncope [n   ]  General Review of Systems: [Y] = yes [  ]=no Constitional: recent weight change [n  ];  Wt loss over the last 3 months [   ] anorexia [  ]; fatigue Blue.Reese  ]; nausea [n  ]; night sweats [ n ]; fever [n  ]; or chills [n  ];  Dental: poor dentition[  dentures ]; Last Dentist visit:   Eye : blurred vision [  ]; diplopia [   ]; vision changes [  ];  Amaurosis fugax[  ]; Resp: cough [  ];  wheezing[  ];  hemoptysis[  ]; shortness of breath[ y ]; paroxysmal nocturnal dyspnea[ n ]; dyspnea on exertion[ y ]; or orthopnea[ n ];  GI:  gallstones[  ], vomiting[n  ];  dysphagia[  ]; melena[  ];  hematochezia [  ]; heartburn[  ];   Hx of  Colonoscopy[ n ]; GU: kidney stones [n  ]; hematuria[ n ];   dysuria [  ];  nocturia[  ];  history of     obstruction [  ]; urinary frequency [  ]             Skin: rash, swelling[  ];, hair loss[  ];  peripheral edema[ n ];  or itching[  ]; Musculosketetal: myalgias[  ];  joint swelling[  ];  joint erythema[  ];  joint pain[  ];  back pain[  ];  Heme/Lymph: bruising[ y ];  bleeding[  ];  anemia[  ];  Neuro: TIA[  ];  headaches[  ];  stroke[  ];  vertigo[  ];  seizures[  ];   paresthesias[  ];  difficulty walking[ n ];  Psych:depression[  ]; anxiety[  ];  Endocrine: diabetes[n  ];  thyroid dysfunction[n  ];  Immunizations: Flu up to date Blue.Reese  ]; Pneumococcal up to date [ y ];  Other:  Physical Exam: Ht 5\' 8"  (1.727 m)  Wt 142 lb (64.411 kg)  BMI 21.60 kg/m2  SpO2 %  PHYSICAL EXAMINATION:  General appearance: alert, cooperative, appears stated age and no distress Neurologic: intact Heart: regular rate and rhythm and systolic murmur: holosystolic 4/6, crescendo throughout the precordium Lungs: clear to auscultation bilaterally Abdomen: soft, non-tender; bowel sounds normal; no masses,  no organomegaly Extremities: extremities normal, atraumatic, no cyanosis or edema and Homans sign is negative, no sign of DVT Patient has no cervical or supraclavicular adenopathy   Diagnostic Studies & Laboratory data:     Recent Radiology Findings:    CT THORAX(CHEST) W/IV CONT. . Aug 23, 2013 10:44:49 AM HISTORY: left lobe mass 518.89 OTHER DISEASES OF LUNG, NOT ELSEWHERE CLASSIFIED COMPARISON: None TECHNIQUE: CT of  the chest was performed following IV contrast administration (139ml Isovue).  FINDINGS: Mediastinum: 1.5 cm right thyroid nodule with internal calcifications . Ascending aorta is dilated measuring 5.1 cm. Severe aortic valve plane calcifications are present. There is severe descending aortic atherosclerosis with ulcerated plaques image 57. Esophagus appears normal. No mm adenopathy.  Limited abdomen: Simple attenuation left renal cyst. Otherwise negative.  Musculoskeletal: Bones osteopenic. Minimal spinal curvature.  Lungs: Polyps or more likely extensive debris within the trachea. Severe emphysema. Mild bronchial wall thickening. 2.0 cm spiculated nodule in the left upper lobe. There is a small amount of adjacent pneumonitis. This abuts the major fissure and is distant from the carina. No effusion.  Impression: 1. 2.0 cm nodule in the left upper lobe compatible with bronchogenic carcinoma. No adenopathy by size criteria or sign of distant metastasis. 2. Aortic stenosis with aneurysmal dilatation of the ascending aorta. 3. Severe emphysema. 4. Severe atherosclerosis. 1.57 calcified right thyroid nodule.. 5. Polyps or debris within the trachea. Debris is favored.   NM PET/CT SKULL BASE TO MID THIGH. Aug 28, 2013 08:30:00 PM  Clinical Information: 793.11 SOLITARY PULMONARY NODULE 2CT  Radiopharmaceutical: 12.60  mCi of F-18 Fluorodeoxyglucose, IV.  Technique: Images from skull base to proximal femora were obtained and reconstructed in the sagittal, coronal and axial planes. CT localizer scan was performed with CT fusion images evaluated in the workstation.  The patient's blood Glucose level measures 96 mg/dl. The patient stands 5 ft 8 inches and weighs 145 lbs. Uptake time: 60 min. CTDI: 6.39 mGy.  Findings: There is no prior PET scan for comparison. The 2 cm spiculated nodule in in the left upper lobe shows marked FDG accumulation with a mean uptake of 14.20, maximal 20.95 SUV by  bodyweight. No mediastinal or hilar nodal uptake is seen. There is minimal uptake in the oral pharyngeal area which is physiologic. There is also physiologic uptake within the myocardium, the liver, spleen, kidneys with tracer accumulation within the urinary bladder. Activity with the bowel lumen appears to be luminal.  There is an aneurysm of the ascending aorta measuring 5.9 cm x 5.3 cm. A right parapelvic cyst is present. There is a 5 cm low density oval mass in the upper pole of the left kidney with a mean attenuation value of 13 Hounsfield units. Cholecystectomy clips are seen. There is a large infrarenal abdominal aneurysm which does not extend into the bifurcation measuring 5.2 cm x 5.1 cm. Mild dilatation of the right common iliac artery is seen. The prostate gland is enlarged with some coarse calcification within it. Possible hydroceles in the scrotum.  Impression: 1. Marked FDG uptake in the left upper lobe nodule compatible with a malignant neoplastic process. 2. No definite evidence of mediastinal or hilar metastases. No suspicious uptake in the neck, abdomen or pelvis. 3. There is an aneurysm of the ascending aorta measuring 5.9 cm x 5.3 cm. Large infrarenal abdominal aortic aneurysm measures 5.2 cm x 5.1 cm. 4. Mild dilatation of the proximal right common iliac artery. 5. Enlarged prostate gland. There may be hydrocele in the scrotum.   Cardiac CAth:' Procedural Findings:  Hemodynamics  RA 4  RV 23/2  PA 23/7 mean 11  PCWP not recorded  LV not recorded  AO 107/53  Oxygen saturations:  PA 69  AO 96  Cardiac Output (Fick) 4.5  Cardiac Index (Fick) 2.5  Coronary angiography:  Coronary dominance: left  Left mainstem: The left main is short, heavily calcified, with minor 20% stenosis noted.  Left anterior descending (LAD): The LAD is moderately calcified. There is diffuse 60-70% stenosis throughout the mid LAD. The vessel wraps around the left ventricular apex. The first septal  perforator is patent. There is a tiny diagonal branch arising from the LAD.  Left circumflex (LCx): The left circumflex is dominant. The vessel is large in caliber. The proximal vessel has 40% irregular stenosis. There is an intermediate branch without significant disease. The obtuse marginal branches are patent without significant disease. The left PDA branch is patent without significant disease.  Right coronary artery (RCA): Small, nondominant vessel supplying 2 small RV marginal  Aortic root angiography: The aortic valve has a very severe calcification with restricted motion. There is mild aortic insufficiency. The aortic root and ascending aorta are dilated.  Estimated Blood Loss: Minimal  Final Conclusions:  1. Known severe aortic stenosis with a severely calcified and restricted aortic valve on plain fluoroscopy  2. Mild to moderate CAD with moderate mid LAD stenosis and nonobstructive left circumflex stenosis and a left dominant coronary system  3. Normal right heart pressures  4. Ascending aortic aneurysm      Recent Lab Findings:  Lab Results  Component Value Date   WBC 8.4 09/29/2013   HGB 13.6 09/29/2013   HCT 41.2 09/29/2013   PLT 208 09/29/2013   GLUCOSE 89 09/19/2013   NA 138 09/19/2013   K 3.8 09/19/2013   CL 101 09/19/2013   CREATININE 1.10 09/19/2013   BUN 26* 09/19/2013   CO2 26 09/19/2013   INR 1.10 09/29/2013   PFT's FEV1  1.49   59%  DLCO 12.7   42%  Assessment / Plan:   Patient now has biopsy negative for lung malignancy  of the left upper lobe, in the setting of 80 years old, current smoker, critical aortic stenosis, significantly dilated ascending and abdominal aorta,  and CT scan of the chest suggestive of significant pulmonary disease. I have discussed with the patient and his daughter options including cabg,root replacement, avr,   With age and poor PFT  Operative risks are signifacent . Patient also has enlarging abdominal AAA, will have him see vascular surgery before  making decision about his various problems and which to approach first. Patient has follow up with cardiology in early September.  Grace Isaac MD      [address] [place] [address] Office [phone]   Beeper [phone]  10/11/2013 4:43 PM

## 2013-10-12 ENCOUNTER — Ambulatory Visit: Payer: Medicare Other | Admitting: Cardiothoracic Surgery

## 2013-10-17 ENCOUNTER — Ambulatory Visit: Payer: Medicare Other | Admitting: Cardiovascular Disease

## 2013-11-08 ENCOUNTER — Encounter: Payer: Self-pay | Admitting: Surgery

## 2013-11-08 ENCOUNTER — Other Ambulatory Visit: Payer: Self-pay | Admitting: *Deleted

## 2013-11-09 ENCOUNTER — Ambulatory Visit: Payer: Medicare Other | Admitting: Cardiothoracic Surgery

## 2013-11-14 ENCOUNTER — Encounter: Payer: Self-pay | Admitting: Cardiovascular Disease

## 2013-11-14 ENCOUNTER — Ambulatory Visit (INDEPENDENT_AMBULATORY_CARE_PROVIDER_SITE_OTHER): Payer: Medicare Other | Admitting: Cardiovascular Disease

## 2013-11-14 VITALS — BP 110/78 | HR 55 | Ht 68.0 in | Wt 144.6 lb

## 2013-11-14 DIAGNOSIS — I2510 Atherosclerotic heart disease of native coronary artery without angina pectoris: Secondary | ICD-10-CM

## 2013-11-14 DIAGNOSIS — I359 Nonrheumatic aortic valve disorder, unspecified: Secondary | ICD-10-CM

## 2013-11-14 DIAGNOSIS — I714 Abdominal aortic aneurysm, without rupture, unspecified: Secondary | ICD-10-CM

## 2013-11-14 DIAGNOSIS — I350 Nonrheumatic aortic (valve) stenosis: Secondary | ICD-10-CM

## 2013-11-14 NOTE — Patient Instructions (Signed)
Your physician recommends that you continue on your current medications as directed. Please refer to the Current Medication list given to you today.  Your physician recommends that you schedule a follow-up appointment in: 3 months with Dr. Acie Fredrickson.

## 2013-11-14 NOTE — Progress Notes (Signed)
Alfred Hall Date of Birth  [birth year]       [facility] [address], [address], [place], [place]  [address]   [place], [place]  [address] [place]   Fax  [phone]     Fax [phone]  Problem List: 1. Aortic stenosis-severe 2. Lung cancer 3. Thoracic aortic aneurysm 4. Hyperlipidemia 5. COPD 6.  Hypertension 7. Abdominal aortic aneurism -  8. PAD - stenting of lower extremities at [facility].   History of Present Illness:  Mr. Alfred Hall is an 80 yo who is typically followed at the [place] in [place], [place].   He was found to have a lung mass.  Further evaluation revealed that this was c/w bronchogenic carcinoma.  He was also found to have a 5.5 cm thoracic aortic aneurysm and aortic stenosis.  He also has COPD.   He has minimal symptoms - minimal dyspnea with his daily activities.   He does not go out for exercise.    He has been losing weight.  Sept. 29, 2015:  He had a cath in August. Final Conclusions:  1. Known severe aortic stenosis with a severely calcified and restricted aortic valve on plain fluoroscopy  2. Mild to moderate CAD with moderate mid LAD stenosis and nonobstructive left circumflex stenosis and a left dominant coronary system  3. Normal right heart pressures  4. Ascending aortic aneurysm   He developed atrial fib while in the cath lab.    Current Outpatient Prescriptions on File Prior to Visit  Medication Sig Dispense Refill  . aspirin EC 81 MG tablet Take 81 mg by mouth daily.      . Garlic 638 MG CAPS Take 300 mg by mouth daily.       . hydrochlorothiazide (HYDRODIURIL) 25 MG tablet Take 25 mg by mouth daily.      . Multiple Vitamins-Minerals (CENTRUM SILVER ADULT 50+ PO) Take 1 tablet by mouth daily.      . simvastatin (ZOCOR) 80 MG tablet Take 40 mg by mouth at bedtime.        No current facility-administered medications on file prior to visit.    No Known  Allergies  Past Medical History  Diagnosis Date  . Bronchogenic cancer of left lung     a. followed by multidisciplinary thoracic oncology clinic  . PVD (peripheral vascular disease)   . Microhematuria     a. workup negative  . Aortic aneurysm, abdominal   . Hypercholesteremia   . Hypertension   . Thoracic aortic aneurysm without rupture   . Aortic stenosis     a. ECHO at [place]- pk AV grad 77 with a mean rate of 53   . Tobacco abuse   . CAD (coronary artery disease)     a. s/p LHC on 09/18/13 with non-obs disease and severe AS    Past Surgical History  Procedure Laterality Date  . Cataract extraction    . Eye surgery      CATARACT    History  Smoking status  . Current Every Day Smoker -- 0.50 packs/day for 60 years  . Types: Cigarettes  Smokeless tobacco  . Never Used    History  Alcohol Use No    Family History  Problem Relation Age of Onset  . Renal Disease Father   . Congestive Heart Failure Mother     Reviw of Systems:  Reviewed in  the HPI.  All other systems are negative.  Physical Exam: Blood pressure 110/78, pulse 55, height 5\' 8"  (1.727 m), weight 144 lb 9.6 oz (65.59 kg). Wt Readings from Last 3 Encounters:  11/14/13 144 lb 9.6 oz (65.59 kg)  10/11/13 142 lb (64.411 kg)  09/29/13 142 lb (64.411 kg)     General: Well developed, well nourished, in no acute distress.  Head: Normocephalic, atraumatic, sclera non-icteric, mucus membranes are moist,   Neck: Supple. Carotids are 2 + without bruits. No JVD   Lungs: Clear   Heart: RR, soft heart sounds, 1-2 / systolic murmur  Abdomen: Soft, non-tender, non-distended with normal bowel sounds.+ pulsitile mass c/w aneurism.   Msk:  Strength and tone are normal   Extremities: No clubbing or cyanosis. No edema.  Distal pedal pulses are 2+ and equal    Neuro: CN II - XII intact.  Alert and oriented X 3.   Psych:  Normal   ECG: September 12, 2013:  NSR at 63, diffue NS ST abnormality.   Assessment /  Plan:

## 2013-11-14 NOTE — Assessment & Plan Note (Signed)
He has severe aortic stenosis.  He has moderate CAD ( primarily in his LAD) by cath. Work up of his lung nodule showed scar and no evidence of cancer. He also has a AAA   He will be seeing Dr. Trula Slade this week and will see Dr. Servando Snare in a week or so to discuss options.   i will see him back in 3 months.

## 2013-11-17 ENCOUNTER — Encounter: Payer: Self-pay | Admitting: Surgery

## 2013-11-20 ENCOUNTER — Other Ambulatory Visit (HOSPITAL_COMMUNITY): Payer: Medicare Other

## 2013-11-20 ENCOUNTER — Encounter: Payer: Self-pay | Admitting: Surgery

## 2013-11-20 ENCOUNTER — Ambulatory Visit (INDEPENDENT_AMBULATORY_CARE_PROVIDER_SITE_OTHER): Payer: Medicare Other | Admitting: Surgery

## 2013-11-20 ENCOUNTER — Ambulatory Visit (HOSPITAL_COMMUNITY)
Admission: RE | Admit: 2013-11-20 | Discharge: 2013-11-20 | Disposition: A | Discharge status: Home / Self Care | Payer: Medicare Other | Source: Ambulatory Visit | Attending: Surgery | Admitting: Surgery

## 2013-11-20 VITALS — BP 113/56 | HR 61 | Ht 68.0 in | Wt 148.0 lb

## 2013-11-20 DIAGNOSIS — I714 Abdominal aortic aneurysm, without rupture, unspecified: Secondary | ICD-10-CM

## 2013-11-20 DIAGNOSIS — Z01810 Encounter for preprocedural cardiovascular examination: Secondary | ICD-10-CM

## 2013-11-20 DIAGNOSIS — I2510 Atherosclerotic heart disease of native coronary artery without angina pectoris: Secondary | ICD-10-CM

## 2013-11-20 NOTE — Progress Notes (Signed)
Patient name: Alfred Hall MRN: [id number] DOB: [birth year] Sex: male   Referred by: Dr. Servando Snare  Reason for referral:  Chief Complaint  Patient presents with  . New Evaluation    ascending aortic aneurysm     HISTORY OF PRESENT ILLNESS: This is an 80 year old gentleman who was referred today for evaluation of abdominal aortic aneurysm.  The patient has a history of a 2 cm lung nodule which was PET positive for biopsy negative.  Was felt that this was a benign lesion.  He has a known descending aortic aneurysm and aortic stenosis.  He is being considered for repair, however he has severe underlying pulmonary issues.  The patient has a history of smoking.  He has cut back and is down to 3-4 cigarettes per day.  He does not wear oxygen and does not have significant shortness of breath.  The patient has peripheral vascular disease and has had left leg stenting in the past at [facility].  His cholesterol is managed with a statin.  He is also treated for hypertension.  Recent cardiac catheterization showed mild to moderate CAD.  The patient also has been followed for an abdominal aortic aneurysm.  He denies any abdominal pain or back pain currently.  Past Medical History  Diagnosis Date  . Bronchogenic cancer of left lung     a. followed by multidisciplinary thoracic oncology clinic  . PVD (peripheral vascular disease)   . Microhematuria     a. workup negative  . Aortic aneurysm, abdominal   . Hypercholesteremia   . Hypertension   . Thoracic aortic aneurysm without rupture   . Aortic stenosis     a. ECHO at [place]- pk AV grad 77 with a mean rate of 53   . Tobacco abuse   . CAD (coronary artery disease)     a. s/p LHC on 09/18/13 with non-obs disease and severe AS  . COPD (chronic obstructive pulmonary disease)     Past Surgical History  Procedure Laterality Date  . Cataract extraction    . Eye surgery      CATARACT    History   Social History  . Marital Status: Married   Spouse Name: N/A    Number of Children: 3  . Years of Education: N/A   Occupational History  . retired from [employer]  . Smoking status: Current Every Day Smoker -- 0.50 packs/day for 60 years    Types: Cigarettes  . Smokeless tobacco: Never Used  . Alcohol Use: No  . Drug Use: No  . Sexual Activity: Not on file   Other Topics Concern  . Not on file   Social History Narrative  . No narrative on file    Family History  Problem Relation Age of Onset  . Renal Disease Father   . Congestive Heart Failure Mother     Allergies as of 11/20/2013  . (No Known Allergies)    Current Outpatient Prescriptions on File Prior to Visit  Medication Sig Dispense Refill  . aspirin EC 81 MG tablet Take 81 mg by mouth daily.      . cholecalciferol (VITAMIN D) 1000 UNITS tablet Take 400 Units by mouth daily.      . Garlic 025 MG CAPS Take 300 mg by mouth daily.       . hydrochlorothiazide (HYDRODIURIL) 25 MG tablet Take 25 mg by mouth daily.      . Multiple Vitamins-Minerals (CENTRUM  SILVER ADULT 50+ PO) Take 1 tablet by mouth daily.      . simvastatin (ZOCOR) 80 MG tablet Take 40 mg by mouth at bedtime.        No current facility-administered medications on file prior to visit.     REVIEW OF SYSTEMS: Cardiovascular: No chest pain, chest pressure, palpitations, orthopnea, or dyspnea on exertion. No claudication or rest pain,  No history of DVT or phlebitis. Pulmonary: No productive cough, asthma or wheezing. Neurologic: No weakness, paresthesias, aphasia, or amaurosis. No dizziness. Hematologic: No bleeding problems or clotting disorders. Musculoskeletal: No joint pain or joint swelling. Gastrointestinal: No blood in stool or hematemesis Genitourinary: No dysuria or hematuria. Psychiatric:: No history of major depression. Integumentary: No rashes or ulcers. Constitutional: No fever or chills.  PHYSICAL EXAMINATION: General: The patient appears their  stated age.  Vital signs are BP 113/56  Pulse 61  Ht 5\' 8"  (1.727 m)  Wt 148 lb (67.132 kg)  BMI 22.51 kg/m2  SpO2 100% HEENT:  No gross abnormalities Pulmonary: Respirations are non-labored Abdomen: Soft and non-tender.  Aneurysm is easily palpable and nontender.  Musculoskeletal: There are no major deformities.   Neurologic: No focal weakness or paresthesias are detected, Skin: There are no ulcer or rashes noted. Psychiatric: The patient has normal affect. Cardiovascular: There is a regular rate and rhythm without significant murmur appreciated.  Palpable femoral pulses bilaterally.  Nonpalpable pedal pulses.  Diagnostic Studies: The patient came with the outside CT scans.  He does not have a CT angiogram of the abdomen and pelvis, however I was able to review his PET scan which did show a 5.4 cm abdominal aortic aneurysm.  Abdominal ultrasound today from my office was ordered and reviewed and shows a 5.4 cm aneurysm.   Assessment:  Abdominal aortic aneurysm Plan: In order to determine the appropriate treatment modality for this patient, he needs a dedicated CT angiogram.  By looking at the PET scan, I feel he will most likely need a fenestrated repair, however I would not be able to determine this until I had better imaging.  I discussed with the family that although I think his infrarenal aneurysm needs to be treated, this will likely need to be done following repair of his aortic stenosis and a descending aortic aneurysm.  I am scheduling him for a CT scan of the chest abdomen and pelvis, as well as carotid ultrasound and lower extremity Doppler studies with followup within the next 2 weeks.     Eldridge Abrahams, M.D. [facility] Office: [phone] Pager:  [phone]

## 2013-11-23 ENCOUNTER — Encounter: Payer: Self-pay | Admitting: Cardiothoracic Surgery

## 2013-11-23 ENCOUNTER — Ambulatory Visit (INDEPENDENT_AMBULATORY_CARE_PROVIDER_SITE_OTHER): Payer: Medicare Other | Admitting: Cardiothoracic Surgery

## 2013-11-23 VITALS — BP 120/77 | HR 72 | Resp 20 | Ht 68.0 in | Wt 148.0 lb

## 2013-11-23 DIAGNOSIS — I712 Thoracic aortic aneurysm, without rupture: Secondary | ICD-10-CM

## 2013-11-23 DIAGNOSIS — I350 Nonrheumatic aortic (valve) stenosis: Secondary | ICD-10-CM

## 2013-11-23 DIAGNOSIS — I7121 Aneurysm of the ascending aorta, without rupture: Secondary | ICD-10-CM

## 2013-11-23 DIAGNOSIS — I714 Abdominal aortic aneurysm, without rupture, unspecified: Secondary | ICD-10-CM

## 2013-11-23 DIAGNOSIS — J449 Chronic obstructive pulmonary disease, unspecified: Secondary | ICD-10-CM

## 2013-11-23 DIAGNOSIS — I2510 Atherosclerotic heart disease of native coronary artery without angina pectoris: Secondary | ICD-10-CM

## 2013-11-23 NOTE — Progress Notes (Signed)
[address]       [place] [address]             [phone]                    Danney P Elizardo [place] Medical Record #[id number] Date of Birth: 1929-06-22  Referring: Augustin Coupe, MD Primary Care: Augustin Coupe, MD  Chief Complaint:       History of Present Illness:    Alfred Hall 80 y.o. male is seen in the office  today for newly diagnosed Lung mass/aortic stenosis/ascending aortic aneurysm/abdominal aortic aneurysm. The patient has been a long-term smoker x60 years with underlying COPD, limited in his pulmonary reserve but not on oxygen at home. He had a known abdominal aortic aneurysms followed at [facility] but not seen since 2013. He had a recent "checkup" at the [facility] in [facility], a chest x-ray suggested a little left upper lobe lung mass. His local provider in [place] arrange CT scan of the chest , PET scan, and echocardiogram. He is referred for further evaluation and deciding on a treatment plan.  In addition to the suspicious left upper lobe lung mass, he's recently been found to have a 5.5 cm ascending aortic aneurysm, critical aortic stenosis by echocardiogram ejection fraction 40-45% peak gradient 77 mean gradient 53, and aortic valve velocity 4.4 m/s, and moderate mitral stenosis. Abdominal aortic aneurysm has increased in size from 4.6 cm in 2013 to 5.2 now.   Surprisingly the patient has few symptoms but is fairly sedentary in his lifestyle. He does care for his ill wife, does light housework grocery shopping, but does not do any yard work around his house because of shortness of breath with exertion. He denies any syncope, denies angina, denies pedal edema. His major limiting symptom is shortness of breath with exertion.  Since last seen the patient has seen cardiology and had cardiac cath and seen ascus surgery concerning his abdominal aortic aneurysm. At time of cath he had onset of Afib and stayed  overnight.  Needle bx of lung mass done:  Needle bx of lung lesion done: Lung, needle/core biopsy(ies), left upper lobe - FIBROSIS WITH PATCHY LYMPHOID AGGREGATES. - SEE MICROSCOPIC DESCRIPTION. Microscopic Comment The airspaces are mostly replaced by variably dense fibrosis with mild associated inflammation. There are patchy lymphoplasmacytic aggregates predominately around terminal airspaces. Immunohistochemistry shows a predominance of CD20 and CD79a positive B cells with a mixture of peripheral CD3 and CD5 positive T cells. CD10, CD30 and cyclin D1 are negative. There are also a few vessels with peripheral fibrosis and inflammatory cells within the wall of the vessel. The features favor an inflammatory fibrosing process and clinical correlation is essential. Recurrent carcinoma is not identified in these core biopsies. Dr. Gari Crown has reviewed this case and agrees. (JDP:gt, 10/03/13) [name] MD Pathologist, Electronic Signature (Case signed 10/03/2013)  Current Activity/ Functional Status:  Patient is independent with mobility/ambulation, transfers, ADL's, IADL's.   Zubrod Score: At the time of surgery this patient's most appropriate activity status/level should be described as: []     0    Normal activity, no symptoms [x]     1    Restricted in physical strenuous activity but ambulatory, able to do out light work []     2    Ambulatory and capable of self care, unable to do work activities, up and about               >  50 % of waking hours                              []     3    Only limited self care, in bed greater than 50% of waking hours []     4    Completely disabled, no self care, confined to bed or chair []     5    Moribund   Past Medical History  Diagnosis Date  . Lung nodule   . PVD (peripheral vascular disease)   . Microhematuria     workup negative  . Aortic aneurysm, abdominal     followed at [facility]  . Hypercholesteremia   . Hypertension   . Valvular heart  disease  recent echocardiogram noted above   . Thoracic aortic aneurysm without rupture  recently noted on CT of the chest  by my measurement 5.2 x 5.4     Past Surgical History  Procedure Laterality Date  . Cataract extraction    . Eye surgery      CATARACT    Family History  Problem Relation Age of Onset  . Renal Disease Father   . Congestive Heart Failure Mother    patient's mother died of COPD at age 15, father died at age 49 with diabetes, one sister is alive with COPD  History   Social History  . Marital Status: Married    Spouse Name: N/A    Number of Children: 3  . Years of Education: N/A   Occupational History  . retired from [employer]  . Smoking status: Current Every Day Smoker -- 0.50 packs/day for 60 years    Types: Cigarettes  . Smokeless tobacco: Never Used  . Alcohol Use: No  . Drug Use: No  . Sexual Activity: Not on file      History  Smoking status  . Current Every Day Smoker -- 0.50 packs/day for 60 years  . Types: Cigarettes  Smokeless tobacco  . Never Used    History  Alcohol Use No     No Known Allergies  Current Outpatient Prescriptions  Medication Sig Dispense Refill  . aspirin EC 81 MG tablet Take 81 mg by mouth daily.      . cholecalciferol (VITAMIN D) 1000 UNITS tablet Take 400 Units by mouth daily.      . Garlic 323 MG CAPS Take 300 mg by mouth daily.       . hydrochlorothiazide (HYDRODIURIL) 25 MG tablet Take 25 mg by mouth daily.      . Multiple Vitamins-Minerals (CENTRUM SILVER ADULT 50+ PO) Take 1 tablet by mouth daily.      . simvastatin (ZOCOR) 80 MG tablet Take 40 mg by mouth at bedtime.        No current facility-administered medications for this visit.     Review of Systems:     Cardiac Review of Systems: Y or N  Chest Pain [  n  ]  Resting SOB [n   ] Exertional SOB  Blue.Reese  ]  Orthopnea Florencio.Farrier  ]   Pedal Edema [ n  ]    Palpitations [ n ] Syncope  [n  ]   Presyncope [n   ]  General  Review of Systems: [Y] = yes [  ]=no Constitional: recent weight change [n  ];  Wt loss over the last 3 months [   ]  anorexia [  ]; fatigue Blue.Reese  ]; nausea [n  ]; night sweats [ n ]; fever [n  ]; or chills [n  ];          Dental: poor dentition[ dentures ]; Last Dentist visit:   Eye : blurred vision [  ]; diplopia [   ]; vision changes [  ];  Amaurosis fugax[  ]; Resp: cough [  ];  wheezing[  ];  hemoptysis[  ]; shortness of breath[ y ]; paroxysmal nocturnal dyspnea[ n ]; dyspnea on exertion[ y ]; or orthopnea[ n ];  GI:  gallstones[  ], vomiting[n  ];  dysphagia[  ]; melena[  ];  hematochezia [  ]; heartburn[  ];   Hx of  Colonoscopy[ n ]; GU: kidney stones [n  ]; hematuria[ n ];   dysuria [  ];  nocturia[  ];  history of     obstruction [  ]; urinary frequency [  ]             Skin: rash, swelling[  ];, hair loss[  ];  peripheral edema[ n ];  or itching[  ]; Musculosketetal: myalgias[  ];  joint swelling[  ];  joint erythema[  ];  joint pain[  ];  back pain[  ];  Heme/Lymph: bruising[ y ];  bleeding[  ];  anemia[  ];  Neuro: TIA[  ];  headaches[  ];  stroke[  ];  vertigo[  ];  seizures[  ];   paresthesias[  ];  difficulty walking[ n ];  Psych:depression[  ]; anxiety[  ];  Endocrine: diabetes[n  ];  thyroid dysfunction[n  ];  Immunizations: Flu up to date Blue.Reese  ]; Pneumococcal up to date [ y ];  Other:  Physical Exam: BP 120/77  Pulse 72  Resp 20  Ht 5\' 8"  (1.727 m)  Wt 148 lb (67.132 kg)  BMI 22.51 kg/m2  SpO2 98%  PHYSICAL EXAMINATION:  General appearance: alert, cooperative, appears stated age and no distress Neurologic: intact Heart: regular rate and rhythm and systolic murmur: holosystolic 4/6, crescendo throughout the precordium Lungs: clear to auscultation bilaterally Abdomen: soft, non-tender; bowel sounds normal; no masses,  no organomegaly Extremities: extremities normal, atraumatic, no cyanosis or edema and Homans sign is negative, no sign of DVT Patient has no cervical or  supraclavicular adenopathy   Diagnostic Studies & Laboratory data:     Recent Radiology Findings:    CT THORAX(CHEST) W/IV CONT. . Aug 23, 2013 10:44:49 AM HISTORY: left lobe mass 518.89 OTHER DISEASES OF LUNG, NOT ELSEWHERE CLASSIFIED COMPARISON: None TECHNIQUE: CT of the chest was performed following IV contrast administration (157ml Isovue).  FINDINGS: Mediastinum: 1.5 cm right thyroid nodule with internal calcifications . Ascending aorta is dilated measuring 5.1 cm. Severe aortic valve plane calcifications are present. There is severe descending aortic atherosclerosis with ulcerated plaques image 57. Esophagus appears normal. No mm adenopathy.  Limited abdomen: Simple attenuation left renal cyst. Otherwise negative.  Musculoskeletal: Bones osteopenic. Minimal spinal curvature.  Lungs: Polyps or more likely extensive debris within the trachea. Severe emphysema. Mild bronchial wall thickening. 2.0 cm spiculated nodule in the left upper lobe. There is a small amount of adjacent pneumonitis. This abuts the major fissure and is distant from the carina. No effusion.  Impression: 1. 2.0 cm nodule in the left upper lobe compatible with bronchogenic carcinoma. No adenopathy by size criteria or sign of distant metastasis. 2. Aortic stenosis with aneurysmal dilatation of the ascending aorta. 3. Severe emphysema.  4. Severe atherosclerosis. 1.57 calcified right thyroid nodule.. 5. Polyps or debris within the trachea. Debris is favored.   NM PET/CT SKULL BASE TO MID THIGH. Aug 28, 2013 08:30:00 PM  Clinical Information: 793.11 SOLITARY PULMONARY NODULE 2CT  Radiopharmaceutical: 12.60 mCi of F-18 Fluorodeoxyglucose, IV.  Technique: Images from skull base to proximal femora were obtained and reconstructed in the sagittal, coronal and axial planes. CT localizer scan was performed with CT fusion images evaluated in the workstation.  The patient's blood Glucose level measures 96 mg/dl. The  patient stands 5 ft 8 inches and weighs 145 lbs. Uptake time: 60 min. CTDI: 6.39 mGy.  Findings: There is no prior PET scan for comparison. The 2 cm spiculated nodule in in the left upper lobe shows marked FDG accumulation with a mean uptake of 14.20, maximal 20.95 SUV by bodyweight. No mediastinal or hilar nodal uptake is seen. There is minimal uptake in the oral pharyngeal area which is physiologic. There is also physiologic uptake within the myocardium, the liver, spleen, kidneys with tracer accumulation within the urinary bladder. Activity with the bowel lumen appears to be luminal.  There is an aneurysm of the ascending aorta measuring 5.9 cm x 5.3 cm. A right parapelvic cyst is present. There is a 5 cm low density oval mass in the upper pole of the left kidney with a mean attenuation value of 13 Hounsfield units. Cholecystectomy clips are seen. There is a large infrarenal abdominal aneurysm which does not extend into the bifurcation measuring 5.2 cm x 5.1 cm. Mild dilatation of the right common iliac artery is seen. The prostate gland is enlarged with some coarse calcification within it. Possible hydroceles in the scrotum.  Impression: 1. Marked FDG uptake in the left upper lobe nodule compatible with a malignant neoplastic process. 2. No definite evidence of mediastinal or hilar metastases. No suspicious uptake in the neck, abdomen or pelvis. 3. There is an aneurysm of the ascending aorta measuring 5.9 cm x 5.3 cm. Large infrarenal abdominal aortic aneurysm measures 5.2 cm x 5.1 cm. 4. Mild dilatation of the proximal right common iliac artery. 5. Enlarged prostate gland. There may be hydrocele in the scrotum.   Cardiac CAth:' Procedural Findings:  Hemodynamics  RA 4  RV 23/2  PA 23/7 mean 11  PCWP not recorded  LV not recorded  AO 107/53  Oxygen saturations:  PA 69  AO 96  Cardiac Output (Fick) 4.5  Cardiac Index (Fick) 2.5  Coronary angiography:  Coronary dominance: left  Left  mainstem: The left main is short, heavily calcified, with minor 20% stenosis noted.  Left anterior descending (LAD): The LAD is moderately calcified. There is diffuse 60-70% stenosis throughout the mid LAD. The vessel wraps around the left ventricular apex. The first septal perforator is patent. There is a tiny diagonal branch arising from the LAD.  Left circumflex (LCx): The left circumflex is dominant. The vessel is large in caliber. The proximal vessel has 40% irregular stenosis. There is an intermediate branch without significant disease. The obtuse marginal branches are patent without significant disease. The left PDA branch is patent without significant disease.  Right coronary artery (RCA): Small, nondominant vessel supplying 2 small RV marginal  Aortic root angiography: The aortic valve has a very severe calcification with restricted motion. There is mild aortic insufficiency. The aortic root and ascending aorta are dilated.  Estimated Blood Loss: Minimal  Final Conclusions:  1. Known severe aortic stenosis with a severely calcified and restricted aortic valve on  plain fluoroscopy  2. Mild to moderate CAD with moderate mid LAD stenosis and nonobstructive left circumflex stenosis and a left dominant coronary system  3. Normal right heart pressures  4. Ascending aortic aneurysm      Recent Lab Findings: Lab Results  Component Value Date   WBC 8.4 09/29/2013   HGB 13.6 09/29/2013   HCT 41.2 09/29/2013   PLT 208 09/29/2013   GLUCOSE 89 09/19/2013   NA 138 09/19/2013   K 3.8 09/19/2013   CL 101 09/19/2013   CREATININE 1.10 09/19/2013   BUN 26* 09/19/2013   CO2 26 09/19/2013   INR 1.10 09/29/2013   PFT's FEV1  1.49   59%  DLCO 12.7   42%  Assessment / Plan:   I have further discuss with the patient and his daughter the extensive nature of his current medical problems including ascending aortic aneurysm severe aortic stenosis moderate coronary artery disease underlying COPD at least moderate and  slowly enlarging abdominal aortic aneurysm. I've discussed with the patient and his daughter the risks of surgical intervention versus the risk of his numerous problems any of which could daily to. I told him that if he wishes to persuade replacement of ascending aorta and aortic valve and coronary artery bypass grafting would need to be done prior to dealing with the abdominal aortic aneurysm. He is considering his options and would like to go home and think about it. I've made him a return appointment next week, to discuss this option further. Spent more than 60 minutes discussing face-to-face with the patient his current disease process and the surgical options and nonsurgical options that are available.  Grace Isaac MD      [address] [place] [address] Office [phone]   Beeper [phone]  11/23/2013 1:24 PM

## 2013-11-23 NOTE — Addendum Note (Signed)
Addended by: Dorthula Rue L on: 11/23/2013 01:54 PM   Modules accepted: Orders

## 2013-11-28 ENCOUNTER — Other Ambulatory Visit: Payer: Self-pay

## 2013-11-28 ENCOUNTER — Ambulatory Visit (HOSPITAL_COMMUNITY)
Admission: RE | Admit: 2013-11-28 | Discharge: 2013-11-28 | Disposition: A | Discharge status: Home / Self Care | Payer: Medicare Other | Source: Ambulatory Visit | Attending: Cardiothoracic Surgery | Admitting: Cardiothoracic Surgery

## 2013-11-28 ENCOUNTER — Ambulatory Visit (INDEPENDENT_AMBULATORY_CARE_PROVIDER_SITE_OTHER): Payer: Medicare Other | Admitting: Cardiothoracic Surgery

## 2013-11-28 ENCOUNTER — Ambulatory Visit: Payer: Medicare Other | Admitting: Cardiothoracic Surgery

## 2013-11-28 ENCOUNTER — Encounter: Payer: Self-pay | Admitting: Cardiothoracic Surgery

## 2013-11-28 VITALS — BP 129/79 | HR 74 | Ht 68.0 in | Wt 148.0 lb

## 2013-11-28 DIAGNOSIS — I359 Nonrheumatic aortic valve disorder, unspecified: Secondary | ICD-10-CM

## 2013-11-28 DIAGNOSIS — Z720 Tobacco use: Secondary | ICD-10-CM | POA: Insufficient documentation

## 2013-11-28 DIAGNOSIS — I10 Essential (primary) hypertension: Secondary | ICD-10-CM | POA: Insufficient documentation

## 2013-11-28 DIAGNOSIS — I340 Nonrheumatic mitral (valve) insufficiency: Secondary | ICD-10-CM

## 2013-11-28 DIAGNOSIS — I2510 Atherosclerotic heart disease of native coronary artery without angina pectoris: Secondary | ICD-10-CM

## 2013-11-28 DIAGNOSIS — I350 Nonrheumatic aortic (valve) stenosis: Secondary | ICD-10-CM | POA: Diagnosis not present

## 2013-11-28 DIAGNOSIS — E780 Pure hypercholesterolemia: Secondary | ICD-10-CM | POA: Insufficient documentation

## 2013-11-28 NOTE — Progress Notes (Signed)
Alfred Hall       Alfred Hall             [phone]                    Alfred Hall [place] Medical Record #[id number] Date of Birth: [birth year]  Referring: Alfred Coupe, MD Primary Care: Alfred Coupe, MD  Chief Complaint:   Aortic stenosis, coronary artery disease, dilated ascending aorta, abdominal aortic aneurysm, left lung mass-granulomatous disease by biopsy    History of Present Illness:    Alfred Hall 80 y.o. male is seen in the office  today for newly diagnosed Lung mass/aortic stenosis/ascending aortic aneurysm/abdominal aortic aneurysm. The patient has been a long-term smoker x60 years with underlying COPD, limited in his pulmonary reserve but not on oxygen at home. He had a known abdominal aortic aneurysms followed at [facility] but not seen since 2013. He had a recent "checkup" at the [facility] in [facility], a chest x-ray suggested a little left upper lobe lung mass. His local provider in [place] arrange CT scan of the chest , PET scan, and echocardiogram. He is referred for further evaluation and deciding on a treatment plan.  In addition to the suspicious left upper lobe lung mass, he's recently been found to have a 5.5 cm ascending aortic aneurysm, critical aortic stenosis by echocardiogram ejection fraction 40-45% peak gradient 77 mean gradient 53, and aortic valve velocity 4.4 m/s, and moderate mitral stenosis. Abdominal aortic aneurysm has increased in size from 4.6 cm in 2013 to 5.2 now.   Surprisingly the patient has few symptoms but is fairly sedentary in his lifestyle. He does care for his ill wife, does light housework grocery shopping, but does not do any yard work around his house because of shortness of breath with exertion. He denies any syncope, denies angina, denies pedal edema. His major limiting symptom is shortness of breath with exertion.  Since last seen the patient has seen cardiology and  had cardiac cath and seen ascus surgery concerning his abdominal aortic aneurysm. At time of cath he had onset of Afib and stayed overnight.  Needle bx of lung mass done:  Needle bx of lung lesion done: Lung, needle/core biopsy(ies), left upper lobe - FIBROSIS WITH PATCHY LYMPHOID AGGREGATES. - SEE MICROSCOPIC DESCRIPTION. Microscopic Comment The airspaces are mostly replaced by variably dense fibrosis with mild associated inflammation. There are patchy lymphoplasmacytic aggregates predominately around terminal airspaces. Immunohistochemistry shows a predominance of CD20 and CD79a positive B cells with a mixture of peripheral CD3 and CD5 positive T cells. CD10, CD30 and cyclin D1 are negative. There are also a few vessels with peripheral fibrosis and inflammatory cells within the wall of the vessel. The features favor an inflammatory fibrosing process and clinical correlation is essential. Recurrent carcinoma is not identified in these core biopsies. Dr. Gari Hall has reviewed this case and agrees. (JDP:gt, 10/03/13) Alfred Laws MD Pathologist, Electronic Signature (Case signed 10/03/2013)  The patient comes in today to decide about proceeding with replacement of aortic valve and ascending aorta and coronary artery bypass grafting in preparation for repair of abdominal aortic aneurysm. On his last visit reviewed various options with him and gave him material about aortic valve replacement and coronary artery bypass grafting. He returns today to assess further questions and make a decision about proceeding.   Current Activity/ Functional Status:  Patient is independent with mobility/ambulation, transfers, ADL's, IADL's.  Zubrod Score: At the time of surgery this patient's most appropriate activity status/level should be described as: []     0    Normal activity, no symptoms [x]     1    Restricted in physical strenuous activity but ambulatory, able to do out light work []     2    Ambulatory  and capable of self care, unable to do work activities, up and about               >50 % of waking hours                              []     3    Only limited self care, in bed greater than 50% of waking hours []     4    Completely disabled, no self care, confined to bed or chair []     5    Moribund   Past Medical History  Diagnosis Date  . Lung nodule   . PVD (peripheral vascular disease)   . Microhematuria     workup negative  . Aortic aneurysm, abdominal     followed at [facility]  . Hypercholesteremia   . Hypertension   . Valvular heart disease  recent echocardiogram noted above   . Thoracic aortic aneurysm without rupture  recently noted on CT of the chest  by my measurement 5.2 x 5.4     Past Surgical History  Procedure Laterality Date  . Cataract extraction    . Eye surgery      CATARACT    Family History  Problem Relation Age of Onset  . Renal Disease Father   . Congestive Heart Failure Mother    patient's mother died of COPD at age 44, father died at age 30 with diabetes, one sister is alive with COPD  History   Social History  . Marital Status: Married    Spouse Name: N/A    Number of Children: 3  . Years of Education: N/A   Occupational History  . retired from [employer]  . Smoking status: Current Every Day Smoker -- 0.50 packs/day for 60 years    Types: Cigarettes  . Smokeless tobacco: Never Used  . Alcohol Use: No  . Drug Use: No  . Sexual Activity: Not on file      History  Smoking status  . Current Every Day Smoker -- 0.50 packs/day for 60 years  . Types: Cigarettes  Smokeless tobacco  . Never Used    History  Alcohol Use No     No Known Allergies  Current Outpatient Prescriptions  Medication Sig Dispense Refill  . aspirin EC 81 MG tablet Take 81 mg by mouth daily.      . cholecalciferol (VITAMIN D) 1000 UNITS tablet Take 400 Units by mouth daily.      . Garlic 465 MG CAPS Take 300 mg by mouth daily.        . hydrochlorothiazide (HYDRODIURIL) 25 MG tablet Take 25 mg by mouth daily.      . Multiple Vitamins-Minerals (CENTRUM SILVER ADULT 50+ PO) Take 1 tablet by mouth daily.      . simvastatin (ZOCOR) 80 MG tablet Take 40 mg by mouth at bedtime.        No current facility-administered medications for this visit.     Review of Systems:     Cardiac Review of  Systems: Y or N  Chest Pain [  n  ]  Resting SOB [n   ] Exertional SOB  Blue.Reese  ]  Orthopnea Florencio.Farrier  ]   Pedal Edema [ n  ]    Palpitations [ n ] Syncope  [n  ]   Presyncope [n   ]  General Review of Systems: [Y] = yes [  ]=no Constitional: recent weight change [n  ];  Wt loss over the last 3 months [   ] anorexia [  ]; fatigue Blue.Reese  ]; nausea [n  ]; night sweats [ n ]; fever [n  ]; or chills [n  ];          Dental: poor dentition[ dentures ]; Last Dentist visit:   Eye : blurred vision [  ]; diplopia [   ]; vision changes [  ];  Amaurosis fugax[  ]; Resp: cough [  ];  wheezing[  ];  hemoptysis[  ]; shortness of breath[ y ]; paroxysmal nocturnal dyspnea[ n ]; dyspnea on exertion[ y ]; or orthopnea[ n ];  GI:  gallstones[  ], vomiting[n  ];  dysphagia[  ]; melena[  ];  hematochezia [  ]; heartburn[  ];   Hx of  Colonoscopy[ n ]; GU: kidney stones [n  ]; hematuria[ n ];   dysuria [  ];  nocturia[  ];  history of     obstruction [  ]; urinary frequency [  ]             Skin: rash, swelling[  ];, hair loss[  ];  peripheral edema[ n ];  or itching[  ]; Musculosketetal: myalgias[  ];  joint swelling[  ];  joint erythema[  ];  joint pain[  ];  back pain[  ];  Heme/Lymph: bruising[ y ];  bleeding[  ];  anemia[  ];  Neuro: TIA[  ];  headaches[  ];  stroke[  ];  vertigo[  ];  seizures[  ];   paresthesias[  ];  difficulty walking[ n ];  Psych:depression[  ]; anxiety[  ];  Endocrine: diabetes[n  ];  thyroid dysfunction[n  ];  Immunizations: Flu up to date Blue.Reese  ]; Pneumococcal up to date [ y ];  Other:  Physical Exam: BP 129/79  Pulse 74  Ht 5\' 8"  (1.727 m)   Wt 148 lb (67.132 kg)  BMI 22.51 kg/m2  SpO2 96%  PHYSICAL EXAMINATION:  General appearance: alert, cooperative, appears stated age and no distress Neurologic: intact Heart: regular rate and rhythm and systolic murmur: holosystolic 4/6, crescendo throughout the precordium Lungs: clear to auscultation bilaterally Abdomen: soft, non-tender; bowel sounds normal; no masses,  no organomegaly Extremities: extremities normal, atraumatic, no cyanosis or edema and Homans sign is negative, no sign of DVT Patient has no cervical or supraclavicular adenopathy   Diagnostic Studies & Laboratory data:     Recent Radiology Findings:    CT THORAX(CHEST) W/IV CONT. . Aug 23, 2013 10:44:49 AM HISTORY: left lobe mass 518.89 OTHER DISEASES OF LUNG, NOT ELSEWHERE CLASSIFIED COMPARISON: None TECHNIQUE: CT of the chest was performed following IV contrast administration (158ml Isovue).  FINDINGS: Mediastinum: 1.5 cm right thyroid nodule with internal calcifications . Ascending aorta is dilated measuring 5.1 cm. Severe aortic valve plane calcifications are present. There is severe descending aortic atherosclerosis with ulcerated plaques image 57. Esophagus appears normal. No mm adenopathy.  Limited abdomen: Simple attenuation left renal cyst. Otherwise negative.  Musculoskeletal: Bones osteopenic. Minimal spinal curvature.  Lungs: Polyps or more likely extensive debris within the trachea. Severe emphysema. Mild bronchial wall thickening. 2.0 cm spiculated nodule in the left upper lobe. There is a small amount of adjacent pneumonitis. This abuts the major fissure and is distant from the carina. No effusion.  Impression: 1. 2.0 cm nodule in the left upper lobe compatible with bronchogenic carcinoma. No adenopathy by size criteria or sign of distant metastasis. 2. Aortic stenosis with aneurysmal dilatation of the ascending aorta. 3. Severe emphysema. 4. Severe atherosclerosis. 1.57 calcified right thyroid  nodule.. 5. Polyps or debris within the trachea. Debris is favored.   NM PET/CT SKULL BASE TO MID THIGH. Aug 28, 2013 08:30:00 PM  Clinical Information: 793.11 SOLITARY PULMONARY NODULE 2CT  Radiopharmaceutical: 12.60 mCi of F-18 Fluorodeoxyglucose, IV.  Technique: Images from skull base to proximal femora were obtained and reconstructed in the sagittal, coronal and axial planes. CT localizer scan was performed with CT fusion images evaluated in the workstation.  The patient's blood Glucose level measures 96 mg/dl. The patient stands 5 ft 8 inches and weighs 145 lbs. Uptake time: 60 min. CTDI: 6.39 mGy.  Findings: There is no prior PET scan for comparison. The 2 cm spiculated nodule in in the left upper lobe shows marked FDG accumulation with a mean uptake of 14.20, maximal 20.95 SUV by bodyweight. No mediastinal or hilar nodal uptake is seen. There is minimal uptake in the oral pharyngeal area which is physiologic. There is also physiologic uptake within the myocardium, the liver, spleen, kidneys with tracer accumulation within the urinary bladder. Activity with the bowel lumen appears to be luminal.  There is an aneurysm of the ascending aorta measuring 5.9 cm x 5.3 cm. A right parapelvic cyst is present. There is a 5 cm low density oval mass in the upper pole of the left kidney with a mean attenuation value of 13 Hounsfield units. Cholecystectomy clips are seen. There is a large infrarenal abdominal aneurysm which does not extend into the bifurcation measuring 5.2 cm x 5.1 cm. Mild dilatation of the right common iliac artery is seen. The prostate gland is enlarged with some coarse calcification within it. Possible hydroceles in the scrotum.  Impression: 1. Marked FDG uptake in the left upper lobe nodule compatible with a malignant neoplastic process. 2. No definite evidence of mediastinal or hilar metastases. No suspicious uptake in the neck, abdomen or pelvis. 3. There is an aneurysm of the  ascending aorta measuring 5.9 cm x 5.3 cm. Large infrarenal abdominal aortic aneurysm measures 5.2 cm x 5.1 cm. 4. Mild dilatation of the proximal right common iliac artery. 5. Enlarged prostate gland. There may be hydrocele in the scrotum.   Cardiac CAth:' Procedural Findings:  Hemodynamics  RA 4  RV 23/2  PA 23/7 mean 11  PCWP not recorded  LV not recorded  AO 107/53  Oxygen saturations:  PA 69  AO 96  Cardiac Output (Fick) 4.5  Cardiac Index (Fick) 2.5  Coronary angiography:  Coronary dominance: left  Left mainstem: The left main is short, heavily calcified, with minor 20% stenosis noted.  Left anterior descending (LAD): The LAD is moderately calcified. There is diffuse 60-70% stenosis throughout the mid LAD. The vessel wraps around the left ventricular apex. The first septal perforator is patent. There is a tiny diagonal branch arising from the LAD.  Left circumflex (LCx): The left circumflex is dominant. The vessel is large in caliber. The proximal vessel has 40% irregular stenosis. There is an intermediate branch without  significant disease. The obtuse marginal branches are patent without significant disease. The left PDA branch is patent without significant disease.  Right coronary artery (RCA): Small, nondominant vessel supplying 2 small RV marginal  Aortic root angiography: The aortic valve has a very severe calcification with restricted motion. There is mild aortic insufficiency. The aortic root and ascending aorta are dilated.  Estimated Blood Loss: Minimal  Final Conclusions:  1. Known severe aortic stenosis with a severely calcified and restricted aortic valve on plain fluoroscopy  2. Mild to moderate CAD with moderate mid LAD stenosis and nonobstructive left circumflex stenosis and a left dominant coronary system  3. Normal right heart pressures  4. Ascending aortic aneurysm      Recent Lab Findings: Lab Results  Component Value Date   WBC 8.4 09/29/2013   HGB 13.6  09/29/2013   HCT 41.2 09/29/2013   PLT 208 09/29/2013   GLUCOSE 89 09/19/2013   NA 138 09/19/2013   K 3.8 09/19/2013   CL 101 09/19/2013   CREATININE 1.10 09/19/2013   BUN 26* 09/19/2013   CO2 26 09/19/2013   INR 1.10 09/29/2013   PFT's FEV1  1.49   59%  DLCO 12.7   42%  Assessment / Plan:   I have further discuss with the patient and his daughter the extensive nature of his current medical problems including ascending aortic aneurysm severe aortic stenosis moderate coronary artery disease underlying COPD at least moderate and slowly enlarging abdominal aortic aneurysm. I've discussed with the patient and his daughter the risks of surgical intervention versus the risk of his numerous problems any of which could daily to.  The patient has decided he would like to proceed with replacement of his aortic valve and ascending aorta and coronary artery bypass grafting, in preparation for abdominal aortic aneurysm repair at a later time. We'll tentatively proceed on October 26. Prior to that he will obtain an updated echocardiogram done at [facility] so we can view the actual images his previous echo was done and only type report is available.   Grace Isaac MD      [address],[place] [address] Office [phone]   Beeper [phone]  11/28/2013 5:32 PM

## 2013-11-28 NOTE — Progress Notes (Signed)
Echocardiogram 2D Echocardiogram has been performed.  Korde Jeppsen 11/28/2013, 5:08 PM

## 2013-11-29 ENCOUNTER — Other Ambulatory Visit: Payer: Self-pay

## 2013-11-29 DIAGNOSIS — I7121 Aneurysm of the ascending aorta, without rupture: Secondary | ICD-10-CM

## 2013-11-29 DIAGNOSIS — I350 Nonrheumatic aortic (valve) stenosis: Secondary | ICD-10-CM

## 2013-11-29 DIAGNOSIS — I25119 Atherosclerotic heart disease of native coronary artery with unspecified angina pectoris: Secondary | ICD-10-CM

## 2013-11-29 DIAGNOSIS — I712 Thoracic aortic aneurysm, without rupture: Secondary | ICD-10-CM

## 2013-11-30 ENCOUNTER — Encounter (HOSPITAL_COMMUNITY): Payer: Self-pay | Admitting: Pharmacy Technician

## 2013-12-01 ENCOUNTER — Encounter (HOSPITAL_COMMUNITY): Payer: Medicare Other

## 2013-12-01 ENCOUNTER — Other Ambulatory Visit (HOSPITAL_COMMUNITY): Payer: Medicare Other

## 2013-12-01 ENCOUNTER — Other Ambulatory Visit: Payer: Medicare Other

## 2013-12-04 ENCOUNTER — Ambulatory Visit: Payer: Medicare Other | Admitting: Surgery

## 2013-12-05 ENCOUNTER — Telehealth: Payer: Self-pay | Admitting: *Deleted

## 2013-12-05 NOTE — Telephone Encounter (Signed)
Called to follow up with patient unable to leave vm message.

## 2013-12-06 ENCOUNTER — Encounter: Payer: Self-pay | Admitting: *Deleted

## 2013-12-06 NOTE — Progress Notes (Signed)
Patient ID: Alfred Hall, male   DOB: 1929-03-31, 80 y.o.   MRN: [id number] Mr. Balderson daughter, Al Corpus, called yesterday, 12/04/13, to request that her father's scheduled cardiothoracic surgery for 12/08/13 be cancelled and rescheduled to the week of 12/25/13. It is her father's request because he wants to see her son who he hasn't seen in two years.  He will be arriving home on 12/17/13.  I discussed this with Dr. Servando Snare.  He said he would abide by Mr. Arya request.  But, due to the extensive problems he has and the potential rupture of his aneurysm, he highly advises him to proceed as planned for 12/08/13.  His daughter discussed this again with her father and he again wishes to wait and surgery has been rescheduled for 12/26/13.

## 2013-12-08 ENCOUNTER — Inpatient Hospital Stay (HOSPITAL_COMMUNITY): Admission: RE | Admit: 2013-12-08 | Payer: Medicare Other | Source: Ambulatory Visit

## 2013-12-08 ENCOUNTER — Ambulatory Visit (HOSPITAL_COMMUNITY): Payer: Medicare Other

## 2013-12-08 ENCOUNTER — Encounter (HOSPITAL_COMMUNITY): Payer: Medicare Other

## 2013-12-11 ENCOUNTER — Telehealth: Payer: Self-pay | Admitting: *Deleted

## 2013-12-11 ENCOUNTER — Encounter (HOSPITAL_COMMUNITY): Admission: RE | Payer: Self-pay | Source: Ambulatory Visit

## 2013-12-11 ENCOUNTER — Inpatient Hospital Stay (HOSPITAL_COMMUNITY): Admission: RE | Admit: 2013-12-11 | Payer: Medicare Other | Source: Ambulatory Visit | Admitting: Cardiothoracic Surgery

## 2013-12-11 SURGERY — AORTIC VALVE REPLACEMENT (AVR)/CORONARY ARTERY BYPASS GRAFTING (CABG)
Anesthesia: General | Site: Chest

## 2013-12-11 NOTE — Telephone Encounter (Signed)
Called and spoke with patient today.  He was supposed to have surgery today but cancelled due to family coming into town.    We spoke about smoking cessation.  He has a quit date of 12/16/13.  He is down to only 4 cigarettes a day.  I praised his accomplishment.  I gave him a few tips and tricks to help him quit completely.  He was thankful for the call.

## 2013-12-20 ENCOUNTER — Telehealth: Payer: Self-pay | Admitting: *Deleted

## 2013-12-20 NOTE — Telephone Encounter (Signed)
Called to check on patient today.  I listened as he explained how he was doing.  I asked about his smoking cessation.  He stated he quit today.  He has not had a cigarette today.  I listened as he talked about his feelings with quitting.  I encouraged his efforts and let him know he could call me for help.

## 2013-12-21 ENCOUNTER — Encounter: Payer: Self-pay | Admitting: *Deleted

## 2013-12-21 NOTE — [facility] Oncology Navigator Note (Unsigned)
Sent patient a certification of quitting smoking in the mail today.

## 2013-12-22 ENCOUNTER — Encounter (HOSPITAL_COMMUNITY): Payer: Self-pay

## 2013-12-22 ENCOUNTER — Ambulatory Visit (HOSPITAL_COMMUNITY)
Admission: RE | Admit: 2013-12-22 | Discharge: 2013-12-22 | Disposition: A | Discharge status: Home / Self Care | Payer: Medicare Other | Source: Ambulatory Visit | Attending: Cardiothoracic Surgery | Admitting: Cardiothoracic Surgery

## 2013-12-22 ENCOUNTER — Encounter (HOSPITAL_COMMUNITY)
Admission: RE | Admit: 2013-12-22 | Discharge: 2013-12-22 | Disposition: A | Discharge status: Home / Self Care | Payer: Medicare Other | Source: Ambulatory Visit | Attending: Cardiothoracic Surgery | Admitting: Cardiothoracic Surgery

## 2013-12-22 VITALS — BP 101/62 | HR 74 | Temp 97.8°F | Resp 20 | Ht 68.5 in | Wt 147.8 lb

## 2013-12-22 DIAGNOSIS — I2510 Atherosclerotic heart disease of native coronary artery without angina pectoris: Secondary | ICD-10-CM | POA: Diagnosis not present

## 2013-12-22 DIAGNOSIS — I25119 Atherosclerotic heart disease of native coronary artery with unspecified angina pectoris: Secondary | ICD-10-CM

## 2013-12-22 DIAGNOSIS — Z87891 Personal history of nicotine dependence: Secondary | ICD-10-CM | POA: Diagnosis not present

## 2013-12-22 DIAGNOSIS — I350 Nonrheumatic aortic (valve) stenosis: Secondary | ICD-10-CM

## 2013-12-22 DIAGNOSIS — Z01812 Encounter for preprocedural laboratory examination: Secondary | ICD-10-CM | POA: Insufficient documentation

## 2013-12-22 DIAGNOSIS — J449 Chronic obstructive pulmonary disease, unspecified: Secondary | ICD-10-CM | POA: Insufficient documentation

## 2013-12-22 DIAGNOSIS — I7121 Aneurysm of the ascending aorta, without rupture: Secondary | ICD-10-CM

## 2013-12-22 DIAGNOSIS — I712 Thoracic aortic aneurysm, without rupture: Secondary | ICD-10-CM

## 2013-12-22 DIAGNOSIS — Z01810 Encounter for preprocedural cardiovascular examination: Secondary | ICD-10-CM | POA: Insufficient documentation

## 2013-12-22 HISTORY — DX: Abdominal aortic aneurysm, without rupture, unspecified: I71.40

## 2013-12-22 HISTORY — DX: Abdominal aortic aneurysm, without rupture: I71.4

## 2013-12-22 HISTORY — DX: Hypoglycemia, unspecified: E16.2

## 2013-12-22 LAB — URINALYSIS, ROUTINE W REFLEX MICROSCOPIC
Bilirubin Urine: NEGATIVE
Glucose, UA: NEGATIVE mg/dL
Ketones, ur: NEGATIVE mg/dL
Leukocytes, UA: NEGATIVE
Nitrite: NEGATIVE
Protein, ur: NEGATIVE mg/dL
Specific Gravity, Urine: 1.022 (ref 1.005–1.030)
Urobilinogen, UA: 0.2 mg/dL (ref 0.0–1.0)
pH: 5.5 (ref 5.0–8.0)

## 2013-12-22 LAB — BLOOD GAS, ARTERIAL
Acid-Base Excess: 2.4 mmol/L — ABNORMAL HIGH (ref 0.0–2.0)
Bicarbonate: 26.2 mEq/L — ABNORMAL HIGH (ref 20.0–24.0)
Drawn by: 42180
FIO2: 0.21 %
O2 Saturation: 89.5 %
Patient temperature: 98.6
TCO2: 27.4 mmol/L (ref 0–100)
pCO2 arterial: 38.6 mmHg (ref 35.0–45.0)
pH, Arterial: 7.446 (ref 7.350–7.450)
pO2, Arterial: 57.2 mmHg — ABNORMAL LOW (ref 80.0–100.0)

## 2013-12-22 LAB — COMPREHENSIVE METABOLIC PANEL
ALT: 10 U/L (ref 0–53)
AST: 18 U/L (ref 0–37)
Albumin: 3.9 g/dL (ref 3.5–5.2)
Alkaline Phosphatase: 53 U/L (ref 39–117)
Anion gap: 14 (ref 5–15)
BUN: 27 mg/dL — ABNORMAL HIGH (ref 6–23)
CO2: 27 mEq/L (ref 19–32)
Calcium: 9.6 mg/dL (ref 8.4–10.5)
Chloride: 98 mEq/L (ref 96–112)
Creatinine, Ser: 1.29 mg/dL (ref 0.50–1.35)
GFR calc Af Amer: 58 mL/min — ABNORMAL LOW (ref >90)
GFR calc non Af Amer: 50 mL/min — ABNORMAL LOW (ref >90)
Glucose, Bld: 87 mg/dL (ref 70–99)
Potassium: 4 mEq/L (ref 3.7–5.3)
Sodium: 139 mEq/L (ref 137–147)
Total Bilirubin: 0.2 mg/dL — ABNORMAL LOW (ref 0.3–1.2)
Total Protein: 7.4 g/dL (ref 6.0–8.3)

## 2013-12-22 LAB — CBC
HCT: 39.3 % (ref 39.0–52.0)
Hemoglobin: 13 g/dL (ref 13.0–17.0)
MCH: 29 pg (ref 26.0–34.0)
MCHC: 33.1 g/dL (ref 30.0–36.0)
MCV: 87.5 fL (ref 78.0–100.0)
Platelets: 200 10*3/uL (ref 150–400)
RBC: 4.49 MIL/uL (ref 4.22–5.81)
RDW: 14.1 % (ref 11.5–15.5)
WBC: 7.3 10*3/uL (ref 4.0–10.5)

## 2013-12-22 LAB — URINE MICROSCOPIC-ADD ON

## 2013-12-22 LAB — PROTIME-INR
INR: 1.07 (ref 0.00–1.49)
Prothrombin Time: 14.1 seconds (ref 11.6–15.2)

## 2013-12-22 LAB — SURGICAL PCR SCREEN
MRSA, PCR: NEGATIVE
Staphylococcus aureus: NEGATIVE

## 2013-12-22 LAB — HEMOGLOBIN A1C
Hgb A1c MFr Bld: 6 % — ABNORMAL HIGH (ref <5.7)
Mean Plasma Glucose: 126 mg/dL — ABNORMAL HIGH (ref <117)

## 2013-12-22 LAB — ABO/RH: ABO/RH(D): A POS

## 2013-12-22 LAB — APTT: aPTT: 35 seconds (ref 24–37)

## 2013-12-22 NOTE — Progress Notes (Signed)
Pre-op Cardiac Surgery  Carotid Findings:  Findings suggest 1-39% internal carotid artery stenosis bilaterally. Vertebral arteries are patent with antegrade flow.   Upper Extremity Right Left  Brachial Pressures 135-Triphasic 133-Triphasic  Radial Waveforms Triphasic Triphasic  Ulnar Waveforms Triphasic Triphasic  Palmar Arch (Allen's Test) Signal decreases 50% with radial compression, greater than 50% with ulnar compression. Signal decreases greater than 50% with radial compression, is unaffected with ulnar compression.   Findings:   Palpable pedal pulses.  12/22/2013 4:39 PM Maudry Mayhew, RVT, RDCS, RDMS

## 2013-12-22 NOTE — Pre-Procedure Instructions (Addendum)
GODFREY TRITSCHLER  12/22/2013   Your procedure is scheduled on: Tuesday, December 26, 2013  Report to [facility] Admitting at 5:30 AM.  Call this number if you have problems the morning of surgery: [phone]   Remember: Bring the Incentive Spirometer ( breathing device) and teaching packet to the  hospital on day of admission.   Do not eat food or drink liquids after midnight Monday, December 25, 2013   Take these medicines the morning of surgery with A SIP OF WATER: None  Stop taking Garlic 809 MG CAPS, Multiple Vitamins-Minerals (CENTRUM SILVER ADULT 50+ PO) and herbal medications. Do not take any NSAIDs ie: Ibuprofen, Advil, Naproxen and etc.   Do not wear jewelry, make-up or nail polish.  Do not wear lotions, powders, or perfumes. You may not wear deodorant.  Do not shave 48 hours prior to surgery. Men may shave face and neck.  Do not bring valuables to the hospital.  [facility] is not responsible for any belongings or valuables.               Contacts, dentures or bridgework may not be worn into surgery.  Leave suitcase in the car. After surgery it may be brought to your room.  For patients admitted to the hospital, discharge time is determined by your treatment team.               Patients discharged the day of surgery will not be allowed to drive home.  Name and phone number of your driver:   Special Instructions:  Special Instructions:Special Instructions: [facility] - Preparing for Surgery  Before surgery, you can play an important role.  Because skin is not sterile, your skin needs to be as free of germs as possible.  You can reduce the number of germs on you skin by washing with CHG (chlorahexidine gluconate) soap before surgery.  CHG is an antiseptic cleaner which kills germs and bonds with the skin to continue killing germs even after washing.  Please DO NOT use if you have an allergy to CHG or antibacterial soaps.  If your skin becomes reddened/irritated stop  using the CHG and inform your nurse when you arrive at Short Stay.  Do not shave (including legs and underarms) for at least 48 hours prior to the first CHG shower.  You may shave your face.  Please follow these instructions carefully:   1.  Shower with CHG Soap the night before surgery and the morning of Surgery.  2.  If you choose to wash your hair, wash your hair first as usual with your normal shampoo.  3.  After you shampoo, rinse your hair and body thoroughly to remove the Shampoo.  4.  Use CHG as you would any other liquid soap.  You can apply chg directly  to the skin and wash gently with scrungie or a clean washcloth.  5.  Apply the CHG Soap to your body ONLY FROM THE NECK DOWN.  Do not use on open wounds or open sores.  Avoid contact with your eyes, ears, mouth and genitals (private parts).  Wash genitals (private parts) with your normal soap.  6.  Wash thoroughly, paying special attention to the area where your surgery will be performed.  7.  Thoroughly rinse your body with warm water from the neck down.  8.  DO NOT shower/wash with your normal soap after using and rinsing off the CHG Soap.  9.  Pat yourself dry with a  clean towel.            10.  Wear clean pajamas.            11.  Place clean sheets on your bed the night of your first shower and do not sleep with pets.  Day of Surgery  Do not apply any lotions/deodorants the morning of surgery.  Please wear clean clothes to the hospital/surgery center.   Please read over the following fact sheets that you were given: Pain Booklet, Coughing and Deep Breathing, Blood Transfusion Information, Open Heart Packet, MRSA Information and Surgical Site Infection Prevention

## 2013-12-22 NOTE — Progress Notes (Signed)
Pt denies SOB and chest pain. Pt stated that he is under the care of Dr. Acie Fredrickson cardiology and denies having a stress test. Pt PCP is Dr. Dillard Cannon. Pt chart forwarded to [place], [place] ( anesthesia) to review cardiac history and abnormal chest x ray

## 2013-12-25 MED ORDER — POTASSIUM CHLORIDE 2 MEQ/ML IV SOLN
80.0000 meq | INTRAVENOUS | Status: DC
  Filled 2013-12-25: qty 40

## 2013-12-25 MED ORDER — SODIUM CHLORIDE 0.9 % IV SOLN
INTRAVENOUS | Status: DC
  Filled 2013-12-25: qty 30

## 2013-12-25 MED ORDER — DEXMEDETOMIDINE HCL IN NACL 400 MCG/100ML IV SOLN
0.1000 ug/kg/h | INTRAVENOUS | Status: AC
  Administered 2013-12-26: 0.2 ug/kg/h via INTRAVENOUS
  Filled 2013-12-25: qty 100

## 2013-12-25 MED ORDER — SODIUM CHLORIDE 0.9 % IV SOLN
INTRAVENOUS | Status: AC
  Administered 2013-12-26: 69.8 mL/h via INTRAVENOUS
  Administered 2013-12-26: 13:00:00 via INTRAVENOUS
  Filled 2013-12-25: qty 40

## 2013-12-25 MED ORDER — DEXTROSE 5 % IV SOLN
30.0000 ug/min | INTRAVENOUS | Status: AC
  Administered 2013-12-26: 25 ug/min via INTRAVENOUS
  Filled 2013-12-25: qty 2

## 2013-12-25 MED ORDER — DEXTROSE 5 % IV SOLN
750.0000 mg | INTRAVENOUS | Status: DC
  Filled 2013-12-25: qty 750

## 2013-12-25 MED ORDER — DEXTROSE 5 % IV SOLN
0.5000 ug/min | INTRAVENOUS | Status: DC
  Filled 2013-12-25: qty 4

## 2013-12-25 MED ORDER — METOPROLOL TARTRATE 12.5 MG HALF TABLET
12.5000 mg | ORAL_TABLET | Freq: Once | ORAL | Status: AC
  Administered 2013-12-26: 12.5 mg via ORAL
  Filled 2013-12-25: qty 1

## 2013-12-25 MED ORDER — DOPAMINE-DEXTROSE 3.2-5 MG/ML-% IV SOLN
0.0000 ug/kg/min | INTRAVENOUS | Status: AC
  Administered 2013-12-26: 3 ug/kg/min via INTRAVENOUS
  Filled 2013-12-25: qty 250

## 2013-12-25 MED ORDER — INSULIN REGULAR HUMAN 100 UNIT/ML IJ SOLN
INTRAMUSCULAR | Status: AC
  Administered 2013-12-26: 1 [IU]/h via INTRAVENOUS
  Filled 2013-12-25: qty 2.5

## 2013-12-25 MED ORDER — VANCOMYCIN HCL 10 G IV SOLR
1250.0000 mg | INTRAVENOUS | Status: AC
  Administered 2013-12-26: 1250 mg via INTRAVENOUS
  Filled 2013-12-25 (×2): qty 1250

## 2013-12-25 MED ORDER — NITROGLYCERIN IN D5W 200-5 MCG/ML-% IV SOLN
2.0000 ug/min | INTRAVENOUS | Status: AC
  Administered 2013-12-26: 5 ug/min via INTRAVENOUS
  Filled 2013-12-25: qty 250

## 2013-12-25 MED ORDER — MAGNESIUM SULFATE 50 % IJ SOLN
40.0000 meq | INTRAMUSCULAR | Status: DC
  Filled 2013-12-25: qty 10

## 2013-12-25 MED ORDER — CEFUROXIME SODIUM 1.5 G IJ SOLR
1.5000 g | INTRAMUSCULAR | Status: AC
  Administered 2013-12-26: .75 g via INTRAVENOUS
  Administered 2013-12-26: 1.5 g via INTRAVENOUS
  Filled 2013-12-25 (×2): qty 1.5

## 2013-12-25 MED ORDER — PLASMA-LYTE 148 IV SOLN
INTRAVENOUS | Status: AC
  Administered 2013-12-26: 200 mL
  Filled 2013-12-25: qty 2.5

## 2013-12-25 NOTE — Progress Notes (Signed)
Anesthesia Chart Review:  Patient is a 80 year old male scheduled for AVR, CABG, replacement of ascending aorta, TEE on 12/26/13 by Dr. Servando Snare.  History includes CAD, severe AS, AAA (was followed at [facility]; measured 5.4 cm 11/20/13 by U/S at [facility] when seen by Dr. Trula Slade), 5.9 cm TAA, PAD s/p left EIA stent '04, recent former smoker (quit date 12/22/13), suspicious LUL lung lesion 09/2013 (fibrosis, no malignancy on biopsy), microhematuria (reported negative work-up), HTN, hypoglycemia, hypercholesterolemia, COPD, nasal surgery. TCTS notes indicate he developed new onset afib following his cardiac cath. PCP is Dr. Belenda Cruise ([place]). Local cardiologist is Dr. Acie Fredrickson.  He has been followed at the [facility] in [place], [place] in the past.   Echo 11/28/13: Global hypokinesis most prominent in the inferior wall; overallEF 45; grade 1 diastolic dysfunction; heavily calcified aortic valve with reduced cusp excursion; severe AS with mean gradient of 56 mmHg; dilated aortic root (44 mm).  Cardiac cath 09/18/13:  Final Conclusions:  1. Known severe aortic stenosis with a severely calcified and restricted aortic valve on plain fluoroscopy  2. Mild to moderate CAD with moderate mid LAD stenosis and nonobstructive left circumflex stenosis and a left dominant coronary system  3. Normal right heart pressures  4. Ascending aortic aneurysm   Carotid duplex 12/22/13: Findings suggest 1-39% internal carotid artery stenosis bilaterally. Vertebral arteries are patent with antegrade flow.  09/06/13 PFT's: FVC 2.70 (75%), FEV11.49 (59%), DLCO 12.74(42%).  Preoperative EKG, CXR, and labs noted.    Patient with significant cardiopulmonary history.  Dr. Servando Snare has recommended the above procedure.  Dr. Trula Slade feels his AS/TAA/CAD should be managed prior to proceeding with AAA repair.  If no acuate changes then I would anticipate that he could proceed as planned.    George Hugh [facility] Short Stay  Center/Anesthesiology Phone [phone] 12/25/2013 9:47 AM

## 2013-12-26 ENCOUNTER — Inpatient Hospital Stay (HOSPITAL_COMMUNITY)
Admission: RE | Admit: 2013-12-26 | Discharge: 2014-01-09 | DRG: 220 | Disposition: A | Discharge status: Skilled Nursing Facility | Payer: Medicare Other | Source: Ambulatory Visit | Attending: Cardiothoracic Surgery | Admitting: Cardiothoracic Surgery

## 2013-12-26 ENCOUNTER — Inpatient Hospital Stay (HOSPITAL_COMMUNITY): Payer: Medicare Other

## 2013-12-26 ENCOUNTER — Encounter (HOSPITAL_COMMUNITY)
Admission: RE | Disposition: A | Discharge status: Skilled Nursing Facility | Payer: Medicare Other | Source: Ambulatory Visit | Attending: Cardiothoracic Surgery

## 2013-12-26 ENCOUNTER — Inpatient Hospital Stay (HOSPITAL_COMMUNITY): Payer: Medicare Other | Admitting: Vascular Surgery

## 2013-12-26 ENCOUNTER — Encounter (HOSPITAL_COMMUNITY): Payer: Self-pay | Admitting: Surgery

## 2013-12-26 ENCOUNTER — Inpatient Hospital Stay (HOSPITAL_COMMUNITY): Payer: Medicare Other | Admitting: Anesthesiology

## 2013-12-26 DIAGNOSIS — D6959 Other secondary thrombocytopenia: Secondary | ICD-10-CM | POA: Diagnosis present

## 2013-12-26 DIAGNOSIS — I700 Atherosclerosis of aorta: Secondary | ICD-10-CM | POA: Diagnosis present

## 2013-12-26 DIAGNOSIS — J449 Chronic obstructive pulmonary disease, unspecified: Secondary | ICD-10-CM | POA: Diagnosis present

## 2013-12-26 DIAGNOSIS — I2510 Atherosclerotic heart disease of native coronary artery without angina pectoris: Secondary | ICD-10-CM | POA: Diagnosis present

## 2013-12-26 DIAGNOSIS — Z9889 Other specified postprocedural states: Secondary | ICD-10-CM

## 2013-12-26 DIAGNOSIS — I7121 Aneurysm of the ascending aorta, without rupture: Secondary | ICD-10-CM

## 2013-12-26 DIAGNOSIS — R339 Retention of urine, unspecified: Secondary | ICD-10-CM | POA: Diagnosis present

## 2013-12-26 DIAGNOSIS — N183 Chronic kidney disease, stage 3 (moderate): Secondary | ICD-10-CM | POA: Diagnosis present

## 2013-12-26 DIAGNOSIS — J9811 Atelectasis: Secondary | ICD-10-CM | POA: Diagnosis not present

## 2013-12-26 DIAGNOSIS — F17210 Nicotine dependence, cigarettes, uncomplicated: Secondary | ICD-10-CM | POA: Diagnosis present

## 2013-12-26 DIAGNOSIS — D62 Acute posthemorrhagic anemia: Secondary | ICD-10-CM | POA: Diagnosis not present

## 2013-12-26 DIAGNOSIS — Z825 Family history of asthma and other chronic lower respiratory diseases: Secondary | ICD-10-CM

## 2013-12-26 DIAGNOSIS — K5900 Constipation, unspecified: Secondary | ICD-10-CM | POA: Diagnosis not present

## 2013-12-26 DIAGNOSIS — I253 Aneurysm of heart: Secondary | ICD-10-CM | POA: Diagnosis present

## 2013-12-26 DIAGNOSIS — N400 Enlarged prostate without lower urinary tract symptoms: Secondary | ICD-10-CM | POA: Diagnosis present

## 2013-12-26 DIAGNOSIS — D689 Coagulation defect, unspecified: Secondary | ICD-10-CM | POA: Diagnosis present

## 2013-12-26 DIAGNOSIS — J189 Pneumonia, unspecified organism: Secondary | ICD-10-CM

## 2013-12-26 DIAGNOSIS — E8770 Fluid overload, unspecified: Secondary | ICD-10-CM | POA: Diagnosis present

## 2013-12-26 DIAGNOSIS — R1310 Dysphagia, unspecified: Secondary | ICD-10-CM | POA: Diagnosis present

## 2013-12-26 DIAGNOSIS — J90 Pleural effusion, not elsewhere classified: Secondary | ICD-10-CM | POA: Diagnosis present

## 2013-12-26 DIAGNOSIS — I350 Nonrheumatic aortic (valve) stenosis: Secondary | ICD-10-CM | POA: Diagnosis present

## 2013-12-26 DIAGNOSIS — E780 Pure hypercholesterolemia: Secondary | ICD-10-CM | POA: Diagnosis present

## 2013-12-26 DIAGNOSIS — I25119 Atherosclerotic heart disease of native coronary artery with unspecified angina pectoris: Secondary | ICD-10-CM

## 2013-12-26 DIAGNOSIS — Z952 Presence of prosthetic heart valve: Secondary | ICD-10-CM

## 2013-12-26 DIAGNOSIS — I714 Abdominal aortic aneurysm, without rupture: Secondary | ICD-10-CM | POA: Diagnosis present

## 2013-12-26 DIAGNOSIS — I480 Paroxysmal atrial fibrillation: Secondary | ICD-10-CM | POA: Diagnosis not present

## 2013-12-26 DIAGNOSIS — Z9849 Cataract extraction status, unspecified eye: Secondary | ICD-10-CM | POA: Diagnosis not present

## 2013-12-26 DIAGNOSIS — I739 Peripheral vascular disease, unspecified: Secondary | ICD-10-CM | POA: Diagnosis present

## 2013-12-26 DIAGNOSIS — I129 Hypertensive chronic kidney disease with stage 1 through stage 4 chronic kidney disease, or unspecified chronic kidney disease: Secondary | ICD-10-CM | POA: Diagnosis present

## 2013-12-26 DIAGNOSIS — Z951 Presence of aortocoronary bypass graft: Secondary | ICD-10-CM

## 2013-12-26 DIAGNOSIS — I712 Thoracic aortic aneurysm, without rupture, unspecified: Secondary | ICD-10-CM | POA: Diagnosis present

## 2013-12-26 DIAGNOSIS — R911 Solitary pulmonary nodule: Secondary | ICD-10-CM | POA: Diagnosis present

## 2013-12-26 HISTORY — PX: ASCENDING AORTIC ROOT REPLACEMENT: SHX5729

## 2013-12-26 HISTORY — PX: AORTIC VALVE REPLACEMENT: SHX41

## 2013-12-26 HISTORY — PX: INTRAOPERATIVE TRANSESOPHAGEAL ECHOCARDIOGRAM: SHX5062

## 2013-12-26 HISTORY — PX: CORONARY ARTERY BYPASS GRAFT: SHX141

## 2013-12-26 HISTORY — PX: LUNG BIOPSY: SHX5088

## 2013-12-26 LAB — POCT I-STAT 3, ART BLOOD GAS (G3+)
Acid-Base Excess: 4 mmol/L — ABNORMAL HIGH (ref 0.0–2.0)
Acid-Base Excess: 3 mmol/L — ABNORMAL HIGH (ref 0.0–2.0)
Acid-base deficit: 1 mmol/L (ref 0.0–2.0)
Acid-base deficit: 2 mmol/L (ref 0.0–2.0)
Acid-base deficit: 4 mmol/L — ABNORMAL HIGH (ref 0.0–2.0)
Acid-base deficit: 4 mmol/L — ABNORMAL HIGH (ref 0.0–2.0)
Bicarbonate: 30.8 mEq/L — ABNORMAL HIGH (ref 20.0–24.0)
Bicarbonate: 28.9 mEq/L — ABNORMAL HIGH (ref 20.0–24.0)
Bicarbonate: 24.8 mEq/L — ABNORMAL HIGH (ref 20.0–24.0)
Bicarbonate: 25 mEq/L — ABNORMAL HIGH (ref 20.0–24.0)
Bicarbonate: 23.1 mEq/L (ref 20.0–24.0)
Bicarbonate: 23.2 mEq/L (ref 20.0–24.0)
O2 Saturation: 100 %
O2 Saturation: 100 %
O2 Saturation: 96 %
O2 Saturation: 97 %
O2 Saturation: 85 %
O2 Saturation: 89 %
Patient temperature: 36.7
Patient temperature: 35.8
TCO2: 33 mmol/L (ref 0–100)
TCO2: 30 mmol/L (ref 0–100)
TCO2: 26 mmol/L (ref 0–100)
TCO2: 27 mmol/L (ref 0–100)
TCO2: 25 mmol/L (ref 0–100)
TCO2: 25 mmol/L (ref 0–100)
pCO2 arterial: 56.6 mmHg — ABNORMAL HIGH (ref 35.0–45.0)
pCO2 arterial: 49 mmHg — ABNORMAL HIGH (ref 35.0–45.0)
pCO2 arterial: 49.2 mmHg — ABNORMAL HIGH (ref 35.0–45.0)
pCO2 arterial: 56 mmHg — ABNORMAL HIGH (ref 35.0–45.0)
pCO2 arterial: 46.3 mmHg — ABNORMAL HIGH (ref 35.0–45.0)
pCO2 arterial: 51.6 mmHg — ABNORMAL HIGH (ref 35.0–45.0)
pH, Arterial: 7.344 — ABNORMAL LOW (ref 7.350–7.450)
pH, Arterial: 7.379 (ref 7.350–7.450)
pH, Arterial: 7.258 — ABNORMAL LOW (ref 7.350–7.450)
pH, Arterial: 7.311 — ABNORMAL LOW (ref 7.350–7.450)
pH, Arterial: 7.26 — ABNORMAL LOW (ref 7.350–7.450)
pH, Arterial: 7.301 — ABNORMAL LOW (ref 7.350–7.450)
pO2, Arterial: 456 mmHg — ABNORMAL HIGH (ref 80.0–100.0)
pO2, Arterial: 401 mmHg — ABNORMAL HIGH (ref 80.0–100.0)
pO2, Arterial: 101 mmHg — ABNORMAL HIGH (ref 80.0–100.0)
pO2, Arterial: 93 mmHg (ref 80.0–100.0)
pO2, Arterial: 52 mmHg — ABNORMAL LOW (ref 80.0–100.0)
pO2, Arterial: 64 mmHg — ABNORMAL LOW (ref 80.0–100.0)

## 2013-12-26 LAB — POCT I-STAT, CHEM 8
BUN: 23 mg/dL (ref 6–23)
BUN: 18 mg/dL (ref 6–23)
BUN: 21 mg/dL (ref 6–23)
BUN: 20 mg/dL (ref 6–23)
BUN: 20 mg/dL (ref 6–23)
BUN: 19 mg/dL (ref 6–23)
BUN: 17 mg/dL (ref 6–23)
BUN: 17 mg/dL (ref 6–23)
BUN: 16 mg/dL (ref 6–23)
Calcium, Ion: 1.27 mmol/L (ref 1.13–1.30)
Calcium, Ion: 0.94 mmol/L — ABNORMAL LOW (ref 1.13–1.30)
Calcium, Ion: 1.28 mmol/L (ref 1.13–1.30)
Calcium, Ion: 0.98 mmol/L — ABNORMAL LOW (ref 1.13–1.30)
Calcium, Ion: 0.97 mmol/L — ABNORMAL LOW (ref 1.13–1.30)
Calcium, Ion: 0.85 mmol/L — ABNORMAL LOW (ref 1.13–1.30)
Calcium, Ion: 0.98 mmol/L — ABNORMAL LOW (ref 1.13–1.30)
Calcium, Ion: 1.01 mmol/L — ABNORMAL LOW (ref 1.13–1.30)
Calcium, Ion: 1.02 mmol/L — ABNORMAL LOW (ref 1.13–1.30)
Chloride: 98 mEq/L (ref 96–112)
Chloride: 100 mEq/L (ref 96–112)
Chloride: 101 mEq/L (ref 96–112)
Chloride: 100 mEq/L (ref 96–112)
Chloride: 99 mEq/L (ref 96–112)
Chloride: 95 mEq/L — ABNORMAL LOW (ref 96–112)
Chloride: 97 mEq/L (ref 96–112)
Chloride: 97 mEq/L (ref 96–112)
Chloride: 100 mEq/L (ref 96–112)
Creatinine, Ser: 1.2 mg/dL (ref 0.50–1.35)
Creatinine, Ser: 0.8 mg/dL (ref 0.50–1.35)
Creatinine, Ser: 1 mg/dL (ref 0.50–1.35)
Creatinine, Ser: 0.9 mg/dL (ref 0.50–1.35)
Creatinine, Ser: 0.9 mg/dL (ref 0.50–1.35)
Creatinine, Ser: 0.9 mg/dL (ref 0.50–1.35)
Creatinine, Ser: 0.9 mg/dL (ref 0.50–1.35)
Creatinine, Ser: 0.9 mg/dL (ref 0.50–1.35)
Creatinine, Ser: 0.9 mg/dL (ref 0.50–1.35)
Glucose, Bld: 132 mg/dL — ABNORMAL HIGH (ref 70–99)
Glucose, Bld: 92 mg/dL (ref 70–99)
Glucose, Bld: 97 mg/dL (ref 70–99)
Glucose, Bld: 146 mg/dL — ABNORMAL HIGH (ref 70–99)
Glucose, Bld: 151 mg/dL — ABNORMAL HIGH (ref 70–99)
Glucose, Bld: 136 mg/dL — ABNORMAL HIGH (ref 70–99)
Glucose, Bld: 123 mg/dL — ABNORMAL HIGH (ref 70–99)
Glucose, Bld: 129 mg/dL — ABNORMAL HIGH (ref 70–99)
Glucose, Bld: 104 mg/dL — ABNORMAL HIGH (ref 70–99)
HCT: 35 % — ABNORMAL LOW (ref 39.0–52.0)
HCT: 26 % — ABNORMAL LOW (ref 39.0–52.0)
HCT: 34 % — ABNORMAL LOW (ref 39.0–52.0)
HCT: 25 % — ABNORMAL LOW (ref 39.0–52.0)
HCT: 24 % — ABNORMAL LOW (ref 39.0–52.0)
HCT: 26 % — ABNORMAL LOW (ref 39.0–52.0)
HCT: 25 % — ABNORMAL LOW (ref 39.0–52.0)
HCT: 26 % — ABNORMAL LOW (ref 39.0–52.0)
HCT: 32 % — ABNORMAL LOW (ref 39.0–52.0)
Hemoglobin: 11.9 g/dL — ABNORMAL LOW (ref 13.0–17.0)
Hemoglobin: 11.6 g/dL — ABNORMAL LOW (ref 13.0–17.0)
Hemoglobin: 8.8 g/dL — ABNORMAL LOW (ref 13.0–17.0)
Hemoglobin: 8.5 g/dL — ABNORMAL LOW (ref 13.0–17.0)
Hemoglobin: 8.2 g/dL — ABNORMAL LOW (ref 13.0–17.0)
Hemoglobin: 8.8 g/dL — ABNORMAL LOW (ref 13.0–17.0)
Hemoglobin: 8.5 g/dL — ABNORMAL LOW (ref 13.0–17.0)
Hemoglobin: 8.8 g/dL — ABNORMAL LOW (ref 13.0–17.0)
Hemoglobin: 10.9 g/dL — ABNORMAL LOW (ref 13.0–17.0)
Potassium: 3.7 mEq/L (ref 3.7–5.3)
Potassium: 3.7 mEq/L (ref 3.7–5.3)
Potassium: 3.8 mEq/L (ref 3.7–5.3)
Potassium: 3.8 mEq/L (ref 3.7–5.3)
Potassium: 4.4 mEq/L (ref 3.7–5.3)
Potassium: 4.4 mEq/L (ref 3.7–5.3)
Potassium: 3.6 mEq/L — ABNORMAL LOW (ref 3.7–5.3)
Potassium: 3.7 mEq/L (ref 3.7–5.3)
Potassium: 3.9 mEq/L (ref 3.7–5.3)
Sodium: 137 mEq/L (ref 137–147)
Sodium: 137 mEq/L (ref 137–147)
Sodium: 139 mEq/L (ref 137–147)
Sodium: 135 mEq/L — ABNORMAL LOW (ref 137–147)
Sodium: 135 mEq/L — ABNORMAL LOW (ref 137–147)
Sodium: 131 mEq/L — ABNORMAL LOW (ref 137–147)
Sodium: 136 mEq/L — ABNORMAL LOW (ref 137–147)
Sodium: 136 mEq/L — ABNORMAL LOW (ref 137–147)
Sodium: 137 mEq/L (ref 137–147)
TCO2: 28 mmol/L (ref 0–100)
TCO2: 27 mmol/L (ref 0–100)
TCO2: 29 mmol/L (ref 0–100)
TCO2: 24 mmol/L (ref 0–100)
TCO2: 24 mmol/L (ref 0–100)
TCO2: 23 mmol/L (ref 0–100)
TCO2: 22 mmol/L (ref 0–100)
TCO2: 23 mmol/L (ref 0–100)
TCO2: 21 mmol/L (ref 0–100)

## 2013-12-26 LAB — APTT
aPTT: 64 seconds — ABNORMAL HIGH (ref 24–37)
aPTT: 58 seconds — ABNORMAL HIGH (ref 24–37)
aPTT: 42 seconds — ABNORMAL HIGH (ref 24–37)

## 2013-12-26 LAB — CBC
HCT: 18.9 % — ABNORMAL LOW (ref 39.0–52.0)
HCT: 22.7 % — ABNORMAL LOW (ref 39.0–52.0)
HEMATOCRIT: 31.2 % — AB (ref 39.0–52.0)
Hemoglobin: 10.5 g/dL — ABNORMAL LOW (ref 13.0–17.0)
Hemoglobin: 6.5 g/dL — CL (ref 13.0–17.0)
Hemoglobin: 8 g/dL — ABNORMAL LOW (ref 13.0–17.0)
MCH: 29.4 pg (ref 26.0–34.0)
MCH: 28.9 pg (ref 26.0–34.0)
MCH: 29.4 pg (ref 26.0–34.0)
MCHC: 33.7 g/dL (ref 30.0–36.0)
MCHC: 34.4 g/dL (ref 30.0–36.0)
MCHC: 35.2 g/dL (ref 30.0–36.0)
MCV: 87.4 fL (ref 78.0–100.0)
MCV: 84 fL (ref 78.0–100.0)
MCV: 83.5 fL (ref 78.0–100.0)
Platelets: 72 10*3/uL — ABNORMAL LOW (ref 150–400)
Platelets: 121 10*3/uL — ABNORMAL LOW (ref 150–400)
Platelets: 89 10*3/uL — ABNORMAL LOW (ref 150–400)
RBC: 3.57 MIL/uL — ABNORMAL LOW (ref 4.22–5.81)
RBC: 2.25 MIL/uL — ABNORMAL LOW (ref 4.22–5.81)
RBC: 2.72 MIL/uL — ABNORMAL LOW (ref 4.22–5.81)
RDW: 14.1 % (ref 11.5–15.5)
RDW: 13.8 % (ref 11.5–15.5)
RDW: 14 % (ref 11.5–15.5)
WBC: 20.3 10*3/uL — ABNORMAL HIGH (ref 4.0–10.5)
WBC: 13.3 10*3/uL — ABNORMAL HIGH (ref 4.0–10.5)
WBC: 8.6 10*3/uL (ref 4.0–10.5)

## 2013-12-26 LAB — FIBRINOGEN: Fibrinogen: 118 mg/dL — ABNORMAL LOW (ref 204–475)

## 2013-12-26 LAB — PROTIME-INR
INR: 2.6 — ABNORMAL HIGH (ref 0.00–1.49)
INR: 1.98 — ABNORMAL HIGH (ref 0.00–1.49)
INR: 1.55 — ABNORMAL HIGH (ref 0.00–1.49)
Prothrombin Time: 28.1 seconds — ABNORMAL HIGH (ref 11.6–15.2)
Prothrombin Time: 22.7 seconds — ABNORMAL HIGH (ref 11.6–15.2)
Prothrombin Time: 18.7 seconds — ABNORMAL HIGH (ref 11.6–15.2)

## 2013-12-26 LAB — HEMOGLOBIN AND HEMATOCRIT, BLOOD
HCT: 25.2 % — ABNORMAL LOW (ref 39.0–52.0)
Hemoglobin: 8.4 g/dL — ABNORMAL LOW (ref 13.0–17.0)

## 2013-12-26 LAB — PREPARE RBC (CROSSMATCH)

## 2013-12-26 LAB — POCT I-STAT 4, (NA,K, GLUC, HGB,HCT)
Glucose, Bld: 115 mg/dL — ABNORMAL HIGH (ref 70–99)
HCT: 30 % — ABNORMAL LOW (ref 39.0–52.0)
Hemoglobin: 10.2 g/dL — ABNORMAL LOW (ref 13.0–17.0)
Potassium: 3.7 mEq/L (ref 3.7–5.3)
Sodium: 138 mEq/L (ref 137–147)

## 2013-12-26 LAB — CREATININE, SERUM
Creatinine, Ser: 0.99 mg/dL (ref 0.50–1.35)
GFR calc Af Amer: 86 mL/min — ABNORMAL LOW (ref >90)
GFR calc non Af Amer: 74 mL/min — ABNORMAL LOW (ref >90)

## 2013-12-26 LAB — PLATELET COUNT: Platelets: 128 10*3/uL — ABNORMAL LOW (ref 150–400)

## 2013-12-26 LAB — MAGNESIUM: Magnesium: 2.8 mg/dL — ABNORMAL HIGH (ref 1.5–2.5)

## 2013-12-26 SURGERY — ASCENDING AORTIC ROOT REPLACEMENT
Anesthesia: General | Site: Chest

## 2013-12-26 MED ORDER — DOCUSATE SODIUM 100 MG PO CAPS
200.0000 mg | ORAL_CAPSULE | Freq: Every day | ORAL | Status: DC
  Administered 2013-12-29 – 2014-01-01 (×4): 200 mg via ORAL
  Filled 2013-12-26 (×4): qty 2

## 2013-12-26 MED ORDER — HEPARIN SODIUM (PORCINE) 1000 UNIT/ML IJ SOLN
INTRAMUSCULAR | Status: AC
  Filled 2013-12-26: qty 1

## 2013-12-26 MED ORDER — EPINEPHRINE HCL 0.1 MG/ML IJ SOSY
PREFILLED_SYRINGE | INTRAMUSCULAR | Status: DC | PRN
  Administered 2013-12-26: .05 mg via INTRAVENOUS
  Administered 2013-12-26: 0.2 mg via INTRAVENOUS

## 2013-12-26 MED ORDER — MORPHINE SULFATE 2 MG/ML IJ SOLN
1.0000 mg | INTRAMUSCULAR | Status: AC | PRN
  Administered 2013-12-27: 4 mg via INTRAVENOUS
  Filled 2013-12-26: qty 2

## 2013-12-26 MED ORDER — MIDAZOLAM HCL 10 MG/2ML IJ SOLN
INTRAMUSCULAR | Status: AC
  Filled 2013-12-26: qty 2

## 2013-12-26 MED ORDER — ROCURONIUM BROMIDE 50 MG/5ML IV SOLN
INTRAVENOUS | Status: AC
  Filled 2013-12-26: qty 1

## 2013-12-26 MED ORDER — CHLORHEXIDINE GLUCONATE 0.12 % MT SOLN
15.0000 mL | Freq: Two times a day (BID) | OROMUCOSAL | Status: DC
  Administered 2013-12-26 – 2013-12-28 (×5): 15 mL via OROMUCOSAL
  Filled 2013-12-26 (×7): qty 15

## 2013-12-26 MED ORDER — STERILE WATER FOR INJECTION IJ SOLN
INTRAMUSCULAR | Status: AC
  Filled 2013-12-26: qty 10

## 2013-12-26 MED ORDER — CETYLPYRIDINIUM CHLORIDE 0.05 % MT LIQD
7.0000 mL | Freq: Four times a day (QID) | OROMUCOSAL | Status: DC
  Administered 2013-12-27 – 2013-12-29 (×10): 7 mL via OROMUCOSAL

## 2013-12-26 MED ORDER — FENTANYL CITRATE 0.05 MG/ML IJ SOLN
INTRAMUSCULAR | Status: AC
  Filled 2013-12-26: qty 5

## 2013-12-26 MED ORDER — CHLORHEXIDINE GLUCONATE 4 % EX LIQD: 30.0000 mL | CUTANEOUS | Status: DC

## 2013-12-26 MED ORDER — ALBUMIN HUMAN 5 % IV SOLN
INTRAVENOUS | Status: DC | PRN
Start: 2013-12-26 — End: 2013-12-26
  Administered 2013-12-26 (×4): via INTRAVENOUS

## 2013-12-26 MED ORDER — DEXMEDETOMIDINE HCL IN NACL 400 MCG/100ML IV SOLN
0.0000 ug/kg/h | INTRAVENOUS | Status: DC
  Administered 2013-12-26 – 2013-12-28 (×8): 0.7 ug/kg/h via INTRAVENOUS
  Filled 2013-12-26 (×3): qty 50
  Filled 2013-12-26: qty 100
  Filled 2013-12-26 (×5): qty 50

## 2013-12-26 MED ORDER — DEXTROSE 5 % IV SOLN
0.0000 ug/min | INTRAVENOUS | Status: DC
  Administered 2013-12-26: 25 ug/min via INTRAVENOUS
  Administered 2013-12-26: 130 ug/min via INTRAVENOUS
  Administered 2013-12-26: 20 ug/min via INTRAVENOUS
  Filled 2013-12-26 (×3): qty 2

## 2013-12-26 MED ORDER — NOREPINEPHRINE BITARTRATE 1 MG/ML IV SOLN
0.0000 ug/min | INTRAVENOUS | Status: DC
  Administered 2013-12-26: 10 ug/min via INTRAVENOUS
  Administered 2013-12-27: 4 ug/min via INTRAVENOUS
  Filled 2013-12-26 (×3): qty 4

## 2013-12-26 MED ORDER — SODIUM CHLORIDE 0.9 % IV SOLN
INTRAVENOUS | Status: DC | PRN
  Administered 2013-12-26: 14:00:00 via INTRAVENOUS

## 2013-12-26 MED ORDER — ROCURONIUM BROMIDE 100 MG/10ML IV SOLN
INTRAVENOUS | Status: DC | PRN
  Administered 2013-12-26: 50 mg via INTRAVENOUS

## 2013-12-26 MED ORDER — SODIUM CHLORIDE 0.9 % IV SOLN: Freq: Once | INTRAVENOUS | Status: AC

## 2013-12-26 MED ORDER — ARTIFICIAL TEARS OP OINT
TOPICAL_OINTMENT | OPHTHALMIC | Status: DC | PRN
  Administered 2013-12-26: 1 via OPHTHALMIC

## 2013-12-26 MED ORDER — SODIUM CHLORIDE 0.9 % IV SOLN
5.0000 g/h | Freq: Once | INTRAVENOUS | Status: DC
  Filled 2013-12-26: qty 20

## 2013-12-26 MED ORDER — PHENYLEPHRINE HCL 10 MG/ML IJ SOLN
0.0000 ug/min | INTRAVENOUS | Status: DC
  Administered 2013-12-26: 130 ug/min via INTRAVENOUS
  Administered 2013-12-27 (×2): 90 ug/min via INTRAVENOUS
  Administered 2013-12-28: 60 ug/min via INTRAVENOUS
  Administered 2013-12-28: 35 ug/min via INTRAVENOUS
  Filled 2013-12-26 (×5): qty 4

## 2013-12-26 MED ORDER — PHENYLEPHRINE HCL 10 MG/ML IJ SOLN
10.0000 mg | INTRAVENOUS | Status: DC | PRN
  Administered 2013-12-26: 50 ug/min via INTRAVENOUS

## 2013-12-26 MED ORDER — ACETAMINOPHEN 500 MG PO TABS
1000.0000 mg | ORAL_TABLET | Freq: Four times a day (QID) | ORAL | Status: DC
  Administered 2013-12-27 – 2013-12-30 (×6): 1000 mg via ORAL
  Filled 2013-12-26 (×19): qty 2

## 2013-12-26 MED ORDER — SODIUM CHLORIDE 0.9 % IJ SOLN
3.0000 mL | Freq: Two times a day (BID) | INTRAMUSCULAR | Status: DC
  Administered 2013-12-27 – 2013-12-31 (×8): 3 mL via INTRAVENOUS
  Administered 2014-01-01: 10:00:00 via INTRAVENOUS

## 2013-12-26 MED ORDER — MIDAZOLAM HCL 2 MG/2ML IJ SOLN
INTRAMUSCULAR | Status: AC
  Filled 2013-12-26: qty 2

## 2013-12-26 MED ORDER — LIDOCAINE HCL (CARDIAC) 20 MG/ML IV SOLN
INTRAVENOUS | Status: DC | PRN
  Administered 2013-12-26: 100 mg via INTRAVENOUS

## 2013-12-26 MED ORDER — LACTATED RINGERS IV SOLN
INTRAVENOUS | Status: DC
  Administered 2013-12-26: 20 mL/h via INTRAVENOUS
  Administered 2013-12-28: 05:00:00 via INTRAVENOUS

## 2013-12-26 MED ORDER — MIDAZOLAM HCL 5 MG/5ML IJ SOLN
INTRAMUSCULAR | Status: DC | PRN
  Administered 2013-12-26 (×3): 1 mg via INTRAVENOUS
  Administered 2013-12-26: 2 mg via INTRAVENOUS
  Administered 2013-12-26: 3 mg via INTRAVENOUS
  Administered 2013-12-26: 1 mg via INTRAVENOUS
  Administered 2013-12-26: 2 mg via INTRAVENOUS
  Administered 2013-12-26: 1 mg via INTRAVENOUS

## 2013-12-26 MED ORDER — ASPIRIN 81 MG PO CHEW
324.0000 mg | CHEWABLE_TABLET | Freq: Every day | ORAL | Status: DC
  Filled 2013-12-26: qty 4

## 2013-12-26 MED ORDER — PROPOFOL 10 MG/ML IV BOLUS
INTRAVENOUS | Status: DC | PRN
  Administered 2013-12-26: 60 mg via INTRAVENOUS

## 2013-12-26 MED ORDER — PHENYLEPHRINE HCL 10 MG/ML IJ SOLN
INTRAMUSCULAR | Status: AC
  Filled 2013-12-26: qty 1

## 2013-12-26 MED ORDER — 0.9 % SODIUM CHLORIDE (POUR BTL) OPTIME
TOPICAL | Status: DC | PRN
  Administered 2013-12-26: 1000 mL

## 2013-12-26 MED ORDER — ONDANSETRON HCL 4 MG/2ML IJ SOLN: 4.0000 mg | Freq: Four times a day (QID) | INTRAMUSCULAR | Status: DC | PRN

## 2013-12-26 MED ORDER — VECURONIUM BROMIDE 10 MG IV SOLR
INTRAVENOUS | Status: AC
  Filled 2013-12-26: qty 10

## 2013-12-26 MED ORDER — EPHEDRINE SULFATE 50 MG/ML IJ SOLN
INTRAMUSCULAR | Status: AC
  Filled 2013-12-26: qty 1

## 2013-12-26 MED ORDER — EPHEDRINE SULFATE 50 MG/ML IJ SOLN
INTRAMUSCULAR | Status: DC | PRN
  Administered 2013-12-26: 2.5 mg via INTRAVENOUS
  Administered 2013-12-26: 5 mg via INTRAVENOUS

## 2013-12-26 MED ORDER — METOPROLOL TARTRATE 1 MG/ML IV SOLN
2.5000 mg | INTRAVENOUS | Status: DC | PRN
  Administered 2014-01-01: 2.5 mg via INTRAVENOUS
  Administered 2014-01-01: 1 mg via INTRAVENOUS
  Filled 2013-12-26 (×4): qty 5

## 2013-12-26 MED ORDER — SODIUM CHLORIDE 0.9 % IV SOLN
Freq: Once | INTRAVENOUS | Status: AC
  Administered 2013-12-26: 20:00:00 via INTRAVENOUS

## 2013-12-26 MED ORDER — BISACODYL 10 MG RE SUPP: 10.0000 mg | Freq: Every day | RECTAL | Status: DC

## 2013-12-26 MED ORDER — MORPHINE SULFATE 2 MG/ML IJ SOLN
2.0000 mg | INTRAMUSCULAR | Status: DC | PRN
  Administered 2013-12-27 (×4): 4 mg via INTRAVENOUS
  Administered 2013-12-28: 2 mg via INTRAVENOUS
  Administered 2013-12-28 – 2013-12-29 (×7): 4 mg via INTRAVENOUS
  Filled 2013-12-26 (×12): qty 2
  Filled 2013-12-26: qty 1

## 2013-12-26 MED ORDER — ASPIRIN EC 325 MG PO TBEC
325.0000 mg | DELAYED_RELEASE_TABLET | Freq: Every day | ORAL | Status: DC
  Filled 2013-12-26 (×3): qty 1

## 2013-12-26 MED ORDER — ALBUTEROL SULFATE HFA 108 (90 BASE) MCG/ACT IN AERS
INHALATION_SPRAY | RESPIRATORY_TRACT | Status: DC | PRN
  Administered 2013-12-26 (×4): 2 via RESPIRATORY_TRACT

## 2013-12-26 MED ORDER — DOPAMINE-DEXTROSE 3.2-5 MG/ML-% IV SOLN
0.0000 ug/kg/min | INTRAVENOUS | Status: DC
  Administered 2013-12-26: 3 ug/kg/min via INTRAVENOUS

## 2013-12-26 MED ORDER — DEXTROSE 5 % IV SOLN
1.5000 g | Freq: Two times a day (BID) | INTRAVENOUS | Status: AC
  Administered 2013-12-26 – 2013-12-28 (×4): 1.5 g via INTRAVENOUS
  Filled 2013-12-26 (×4): qty 1.5

## 2013-12-26 MED ORDER — PROTAMINE SULFATE 10 MG/ML IV SOLN
INTRAVENOUS | Status: AC
Start: 2013-12-26 — End: 2013-12-26
  Filled 2013-12-26: qty 25

## 2013-12-26 MED ORDER — FAMOTIDINE IN NACL 20-0.9 MG/50ML-% IV SOLN
20.0000 mg | Freq: Two times a day (BID) | INTRAVENOUS | Status: AC
  Administered 2013-12-26: 20 mg via INTRAVENOUS

## 2013-12-26 MED ORDER — INSULIN REGULAR BOLUS VIA INFUSION
0.0000 [IU] | Freq: Three times a day (TID) | INTRAVENOUS | Status: DC
  Filled 2013-12-26: qty 10

## 2013-12-26 MED ORDER — LACTATED RINGERS IV SOLN
INTRAVENOUS | Status: DC | PRN
Start: 2013-12-26 — End: 2013-12-26
  Administered 2013-12-26 (×2): via INTRAVENOUS

## 2013-12-26 MED ORDER — ACETAMINOPHEN 650 MG RE SUPP
650.0000 mg | Freq: Once | RECTAL | Status: AC
  Administered 2013-12-26: 650 mg via RECTAL

## 2013-12-26 MED ORDER — HEMOSTATIC AGENTS (NO CHARGE) OPTIME
TOPICAL | Status: DC | PRN
  Administered 2013-12-26: 2 via TOPICAL

## 2013-12-26 MED ORDER — TRAMADOL HCL 50 MG PO TABS: 50.0000 mg | ORAL_TABLET | ORAL | Status: DC | PRN

## 2013-12-26 MED ORDER — SODIUM CHLORIDE 0.9 % IJ SOLN: 3.0000 mL | INTRAMUSCULAR | Status: DC | PRN

## 2013-12-26 MED ORDER — PANTOPRAZOLE SODIUM 40 MG PO TBEC
40.0000 mg | DELAYED_RELEASE_TABLET | Freq: Every day | ORAL | Status: DC
  Administered 2013-12-29 – 2014-01-01 (×4): 40 mg via ORAL
  Filled 2013-12-26 (×4): qty 1

## 2013-12-26 MED ORDER — ACETAMINOPHEN 160 MG/5ML PO SOLN
1000.0000 mg | Freq: Four times a day (QID) | ORAL | Status: DC
  Administered 2013-12-27 – 2013-12-29 (×9): 1000 mg
  Filled 2013-12-26 (×9): qty 40.6

## 2013-12-26 MED ORDER — PROPOFOL 10 MG/ML IV BOLUS
INTRAVENOUS | Status: AC
  Filled 2013-12-26: qty 20

## 2013-12-26 MED ORDER — DOPAMINE-DEXTROSE 3.2-5 MG/ML-% IV SOLN: 0.0000 ug/kg/min | INTRAVENOUS | Status: DC

## 2013-12-26 MED ORDER — FENTANYL CITRATE 0.05 MG/ML IJ SOLN
INTRAMUSCULAR | Status: DC | PRN
  Administered 2013-12-26: 50 ug via INTRAVENOUS
  Administered 2013-12-26: 100 ug via INTRAVENOUS
  Administered 2013-12-26: 250 ug via INTRAVENOUS
  Administered 2013-12-26 (×2): 100 ug via INTRAVENOUS
  Administered 2013-12-26: 250 ug via INTRAVENOUS
  Administered 2013-12-26: 50 ug via INTRAVENOUS
  Administered 2013-12-26: 100 ug via INTRAVENOUS
  Administered 2013-12-26: 250 ug via INTRAVENOUS

## 2013-12-26 MED ORDER — SODIUM CHLORIDE 0.9 % IV SOLN: 250.0000 mL | INTRAVENOUS | Status: DC

## 2013-12-26 MED ORDER — SUCCINYLCHOLINE CHLORIDE 20 MG/ML IJ SOLN
INTRAMUSCULAR | Status: AC
  Filled 2013-12-26: qty 1

## 2013-12-26 MED ORDER — LIDOCAINE HCL (CARDIAC) 20 MG/ML IV SOLN
INTRAVENOUS | Status: AC
  Filled 2013-12-26: qty 5

## 2013-12-26 MED ORDER — POTASSIUM CHLORIDE 10 MEQ/50ML IV SOLN
10.0000 meq | INTRAVENOUS | Status: AC
  Administered 2013-12-26 (×3): 10 meq via INTRAVENOUS

## 2013-12-26 MED ORDER — CENTRUM SILVER ADULT 50+ PO TABS: 1.0000 | ORAL_TABLET | Freq: Every day | ORAL | Status: DC

## 2013-12-26 MED ORDER — HEPARIN SODIUM (PORCINE) 1000 UNIT/ML IJ SOLN
INTRAMUSCULAR | Status: DC | PRN
  Administered 2013-12-26: 22000 [IU] via INTRAVENOUS

## 2013-12-26 MED ORDER — PROTAMINE SULFATE 10 MG/ML IV SOLN
INTRAVENOUS | Status: DC | PRN
  Administered 2013-12-26: 40 mg via INTRAVENOUS
  Administered 2013-12-26: 20 mg via INTRAVENOUS
  Administered 2013-12-26: 30 mg via INTRAVENOUS
  Administered 2013-12-26 (×2): 50 mg via INTRAVENOUS
  Administered 2013-12-26: 30 mg via INTRAVENOUS

## 2013-12-26 MED ORDER — LACTATED RINGERS IV SOLN
INTRAVENOUS | Status: DC | PRN
  Administered 2013-12-26: 07:00:00 via INTRAVENOUS

## 2013-12-26 MED ORDER — HEMOSTATIC AGENTS (NO CHARGE) OPTIME
TOPICAL | Status: DC | PRN
  Administered 2013-12-26: 1 via TOPICAL

## 2013-12-26 MED ORDER — ACETAMINOPHEN 160 MG/5ML PO SOLN: 650.0000 mg | Freq: Once | ORAL | Status: AC

## 2013-12-26 MED ORDER — METOPROLOL TARTRATE 25 MG/10 ML ORAL SUSPENSION
12.5000 mg | Freq: Two times a day (BID) | ORAL | Status: DC
  Administered 2013-12-29 – 2013-12-30 (×2): 12.5 mg
  Filled 2013-12-26 (×9): qty 5

## 2013-12-26 MED ORDER — SODIUM CHLORIDE 0.9 % IV SOLN
Freq: Once | INTRAVENOUS | Status: AC
  Administered 2013-12-26: via INTRAVENOUS

## 2013-12-26 MED ORDER — LACTATED RINGERS IV SOLN
INTRAVENOUS | Status: DC | PRN
  Administered 2013-12-26 (×2): via INTRAVENOUS

## 2013-12-26 MED ORDER — MIDAZOLAM HCL 2 MG/2ML IJ SOLN
2.0000 mg | INTRAMUSCULAR | Status: DC | PRN
  Administered 2013-12-26 – 2013-12-28 (×11): 2 mg via INTRAVENOUS
  Filled 2013-12-26 (×11): qty 2

## 2013-12-26 MED ORDER — CALCIUM CHLORIDE 10 % IV SOLN
INTRAVENOUS | Status: DC | PRN
  Administered 2013-12-26 (×4): 100 mg via INTRAVENOUS

## 2013-12-26 MED ORDER — SODIUM CHLORIDE 0.45 % IV SOLN
INTRAVENOUS | Status: DC
  Administered 2013-12-26: 20 mL/h via INTRAVENOUS
  Administered 2013-12-27 – 2013-12-28 (×2): via INTRAVENOUS

## 2013-12-26 MED ORDER — ALBUMIN HUMAN 5 % IV SOLN
250.0000 mL | INTRAVENOUS | Status: AC | PRN
  Administered 2013-12-26 (×4): 250 mL via INTRAVENOUS
  Filled 2013-12-26 (×2): qty 250

## 2013-12-26 MED ORDER — GELATIN ABSORBABLE MT POWD
OROMUCOSAL | Status: DC | PRN
  Administered 2013-12-26: 4 mL via TOPICAL

## 2013-12-26 MED ORDER — SODIUM CHLORIDE 0.9 % IV SOLN
INTRAVENOUS | Status: DC
  Administered 2013-12-26: 2.8 [IU]/h via INTRAVENOUS
  Filled 2013-12-26: qty 2.5

## 2013-12-26 MED ORDER — NOREPINEPHRINE BITARTRATE 1 MG/ML IV SOLN
0.0000 ug/min | INTRAVENOUS | Status: DC
  Administered 2013-12-26: 4 ug/min via INTRAVENOUS
  Filled 2013-12-26: qty 4

## 2013-12-26 MED ORDER — METOPROLOL TARTRATE 12.5 MG HALF TABLET
12.5000 mg | ORAL_TABLET | Freq: Two times a day (BID) | ORAL | Status: DC
  Administered 2013-12-30 – 2014-01-01 (×4): 12.5 mg via ORAL
  Filled 2013-12-26 (×13): qty 1

## 2013-12-26 MED ORDER — NITROGLYCERIN IN D5W 200-5 MCG/ML-% IV SOLN: 0.0000 ug/min | INTRAVENOUS | Status: DC

## 2013-12-26 MED ORDER — VANCOMYCIN HCL IN DEXTROSE 1-5 GM/200ML-% IV SOLN
1000.0000 mg | Freq: Once | INTRAVENOUS | Status: AC
  Administered 2013-12-26: 1000 mg via INTRAVENOUS
  Filled 2013-12-26: qty 200

## 2013-12-26 MED ORDER — ADULT MULTIVITAMIN W/MINERALS CH
1.0000 | ORAL_TABLET | Freq: Every day | ORAL | Status: DC
  Administered 2013-12-27 – 2014-01-01 (×5): 1 via ORAL
  Filled 2013-12-26 (×6): qty 1

## 2013-12-26 MED ORDER — GLYCOPYRROLATE 0.2 MG/ML IJ SOLN
INTRAMUSCULAR | Status: DC | PRN
  Administered 2013-12-26 (×2): 0.1 mg via INTRAVENOUS

## 2013-12-26 MED ORDER — BISACODYL 5 MG PO TBEC
10.0000 mg | DELAYED_RELEASE_TABLET | Freq: Every day | ORAL | Status: DC
  Administered 2013-12-27 – 2014-01-01 (×5): 10 mg via ORAL
  Filled 2013-12-26 (×6): qty 2

## 2013-12-26 MED ORDER — PHENYLEPHRINE HCL 10 MG/ML IJ SOLN
INTRAMUSCULAR | Status: DC | PRN
  Administered 2013-12-26: 120 ug via INTRAVENOUS
  Administered 2013-12-26: 80 ug via INTRAVENOUS
  Administered 2013-12-26: 40 ug via INTRAVENOUS

## 2013-12-26 MED ORDER — ATORVASTATIN CALCIUM 20 MG PO TABS
20.0000 mg | ORAL_TABLET | Freq: Every day | ORAL | Status: DC
  Administered 2013-12-27 – 2014-01-08 (×13): 20 mg via ORAL
  Filled 2013-12-26 (×15): qty 1

## 2013-12-26 MED ORDER — VECURONIUM BROMIDE 10 MG IV SOLR
INTRAVENOUS | Status: DC | PRN
  Administered 2013-12-26 (×4): 5 mg via INTRAVENOUS

## 2013-12-26 MED ORDER — MAGNESIUM SULFATE 4 GM/100ML IV SOLN
4.0000 g | Freq: Once | INTRAVENOUS | Status: AC
  Administered 2013-12-26: 4 g via INTRAVENOUS
  Filled 2013-12-26: qty 100

## 2013-12-26 MED ORDER — OXYCODONE HCL 5 MG PO TABS: 5.0000 mg | ORAL_TABLET | ORAL | Status: DC | PRN

## 2013-12-26 MED ORDER — SODIUM CHLORIDE 0.9 % IV SOLN
INTRAVENOUS | Status: DC
  Administered 2013-12-26: 20 mL/h via INTRAVENOUS

## 2013-12-26 MED ORDER — LACTATED RINGERS IV SOLN: 500.0000 mL | Freq: Once | INTRAVENOUS | Status: AC | PRN

## 2013-12-26 MED FILL — Sodium Chloride IV Soln 0.9%: INTRAVENOUS | Qty: 3000 | Status: AC

## 2013-12-26 MED FILL — Heparin Sodium (Porcine) Inj 1000 Unit/ML: INTRAMUSCULAR | Qty: 30 | Status: AC

## 2013-12-26 MED FILL — Sodium Bicarbonate IV Soln 8.4%: INTRAVENOUS | Qty: 50 | Status: AC

## 2013-12-26 MED FILL — Electrolyte-R (PH 7.4) Solution: INTRAVENOUS | Qty: 8000 | Status: AC

## 2013-12-26 MED FILL — Heparin Sodium (Porcine) Inj 1000 Unit/ML: INTRAMUSCULAR | Qty: 10 | Status: AC

## 2013-12-26 MED FILL — Albumin, Human Inj 5%: INTRAVENOUS | Qty: 250 | Status: AC

## 2013-12-26 MED FILL — Lidocaine HCl IV Inj 20 MG/ML: INTRAVENOUS | Qty: 5 | Status: AC

## 2013-12-26 MED FILL — Mannitol IV Soln 20%: INTRAVENOUS | Qty: 500 | Status: AC

## 2013-12-26 SURGICAL SUPPLY — 76 items
ADAPTER CARDIO PERF ANTE/RETRO (ADAPTER) ×4 IMPLANT
APPLICATOR COTTON TIP 6IN STRL (MISCELLANEOUS) IMPLANT
ATTRACTOMAT 16X20 MAGNETIC DRP (DRAPES) IMPLANT
BAG DECANTER FOR FLEXI CONT (MISCELLANEOUS) ×4 IMPLANT
BLADE STERNUM SYSTEM 6 (BLADE) ×4 IMPLANT
BLADE SURG 15 STRL LF DISP TIS (BLADE) ×3 IMPLANT
BLADE SURG 15 STRL SS (BLADE) ×1
BOOT SUTURE AID YELLOW STND (SUTURE) IMPLANT
CANISTER SUCTION 2500CC (MISCELLANEOUS) ×4 IMPLANT
CANN PRFSN .5XCNCT 15X34-48 (MISCELLANEOUS) ×3
CANNULA GUNDRY RCSP 15FR (MISCELLANEOUS) ×4 IMPLANT
CANNULA PRFSN .5XCNCT 15X34-48 (MISCELLANEOUS) ×3 IMPLANT
CANNULA VEN 2 STAGE (MISCELLANEOUS) ×1
CATH HEART VENT LEFT (CATHETERS) ×3 IMPLANT
CATH RETROPLEGIA CORONARY 14FR (CATHETERS) IMPLANT
CATH/SQUID NICHOLS JEHLE COR (CATHETERS) IMPLANT
CAUTERY SURG HI TEMP FINE TIP (MISCELLANEOUS) ×4 IMPLANT
COVER SURGICAL LIGHT HANDLE (MISCELLANEOUS) ×4 IMPLANT
CRADLE DONUT ADULT HEAD (MISCELLANEOUS) ×4 IMPLANT
DRAIN CHANNEL 32F RND 10.7 FF (WOUND CARE) ×4 IMPLANT
DRAPE CARDIOVASCULAR INCISE (DRAPES) ×1
DRAPE SLUSH/WARMER DISC (DRAPES) ×4 IMPLANT
DRAPE SRG 135X102X78XABS (DRAPES) ×3 IMPLANT
DRSG AQUACEL AG ADV 3.5X10 (GAUZE/BANDAGES/DRESSINGS) ×4 IMPLANT
DRSG AQUACEL AG ADV 3.5X14 (GAUZE/BANDAGES/DRESSINGS) ×4 IMPLANT
ELECT BLADE 4.0 EZ CLEAN MEGAD (MISCELLANEOUS) ×4
ELECT CAUTERY BLADE 6.4 (BLADE) ×4 IMPLANT
ELECT REM PT RETURN 9FT ADLT (ELECTROSURGICAL) ×8
ELECTRODE BLDE 4.0 EZ CLN MEGD (MISCELLANEOUS) ×3 IMPLANT
ELECTRODE REM PT RTRN 9FT ADLT (ELECTROSURGICAL) ×6 IMPLANT
GAUZE SPONGE 4X4 12PLY STRL (GAUZE/BANDAGES/DRESSINGS) ×8 IMPLANT
GLOVE BIO SURGEON STRL SZ 6.5 (GLOVE) ×24 IMPLANT
GOWN STRL REUS W/ TWL LRG LVL3 (GOWN DISPOSABLE) ×12 IMPLANT
GOWN STRL REUS W/TWL LRG LVL3 (GOWN DISPOSABLE) ×4
GRAFT WOVEN D/V 32DX30L (Vascular Products) ×4 IMPLANT
HEMOSTAT POWDER SURGIFOAM 1G (HEMOSTASIS) ×12 IMPLANT
HEMOSTAT SURGICEL 2X14 (HEMOSTASIS) ×4 IMPLANT
INSERT FOGARTY XLG (MISCELLANEOUS) ×4 IMPLANT
KIT BASIN OR (CUSTOM PROCEDURE TRAY) ×4 IMPLANT
KIT ROOM TURNOVER OR (KITS) ×4 IMPLANT
KIT SUCTION CATH 14FR (SUCTIONS) ×8 IMPLANT
LEAD PACING MYOCARDI (MISCELLANEOUS) ×4 IMPLANT
LOOP VESSEL SUPERMAXI WHITE (MISCELLANEOUS) IMPLANT
NS IRRIG 1000ML POUR BTL (IV SOLUTION) ×20 IMPLANT
PACK OPEN HEART (CUSTOM PROCEDURE TRAY) ×4 IMPLANT
PAD ARMBOARD 7.5X6 YLW CONV (MISCELLANEOUS) ×8 IMPLANT
SEALANT PROGEL (MISCELLANEOUS) ×4 IMPLANT
SEALANT SURG COSEAL 8ML (VASCULAR PRODUCTS) ×8 IMPLANT
SPONGE GAUZE 4X4 12PLY STER LF (GAUZE/BANDAGES/DRESSINGS) ×4 IMPLANT
SUT ETHIBON 2 0 V 52N 30 (SUTURE) IMPLANT
SUT ETHIBOND 2 0 SH (SUTURE) ×1
SUT ETHIBOND 2 0 SH 36X2 (SUTURE) ×3 IMPLANT
SUT ETHIBOND NAB MH 2-0 36IN (SUTURE) ×4 IMPLANT
SUT PROLENE 3 0 RB 1 (SUTURE) ×4 IMPLANT
SUT PROLENE 3 0 SH 1 (SUTURE) ×4 IMPLANT
SUT PROLENE 3 0 SH1 36 (SUTURE) ×4 IMPLANT
SUT PROLENE 4 0 RB 1 (SUTURE) ×5
SUT PROLENE 4-0 RB1 .5 CRCL 36 (SUTURE) ×15 IMPLANT
SUT STEEL 6MS V (SUTURE) ×4 IMPLANT
SUT STEEL SZ 6 DBL 3X14 BALL (SUTURE) ×4 IMPLANT
SUT VIC AB 1 CTX 18 (SUTURE) ×8 IMPLANT
SUT VIC AB 2-0 CTX 27 (SUTURE) ×4 IMPLANT
SUT VIC AB 2-0 CTX 36 (SUTURE) ×8 IMPLANT
SUT VIC AB 2-0 SH 27 (SUTURE) ×2
SUT VIC AB 2-0 SH 27XBRD (SUTURE) ×6 IMPLANT
SUTURE E-PAK OPEN HEART (SUTURE) ×4 IMPLANT
SYSTEM SAHARA CHEST DRAIN ATS (WOUND CARE) ×4 IMPLANT
TAPE CLOTH SURG 4X10 WHT LF (GAUZE/BANDAGES/DRESSINGS) ×4 IMPLANT
TOWEL OR 17X24 6PK STRL BLUE (TOWEL DISPOSABLE) ×8 IMPLANT
TOWEL OR 17X26 10 PK STRL BLUE (TOWEL DISPOSABLE) ×8 IMPLANT
TRAY FOLEY IC TEMP SENS 14FR (CATHETERS) ×4 IMPLANT
TUBE FEEDING 8FR 16IN STR KANG (MISCELLANEOUS) ×4 IMPLANT
UNDERPAD 30X30 INCONTINENT (UNDERPADS AND DIAPERS) ×4 IMPLANT
VALVE MAGNA EASE AORTIC 25MM (Prosthesis & Implant Heart) ×4 IMPLANT
VENT LEFT HEART 12002 (CATHETERS) ×4
WATER STERILE IRR 1000ML POUR (IV SOLUTION) ×8 IMPLANT

## 2013-12-26 NOTE — Anesthesia Preprocedure Evaluation (Addendum)
Anesthesia Evaluation  Patient identified by MRN, date of birth, ID band Patient awake    Reviewed: Allergy & Precautions, H&P , NPO status , Patient's Chart, lab work & pertinent test results  Airway Mallampati: II  TM Distance: >3 FB Neck ROM: Full    Dental  (+) Dental Advisory Given, Teeth Intact   Pulmonary COPDformer smoker,  breath sounds clear to auscultation        Cardiovascular hypertension, Pt. on medications + CAD and + Peripheral Vascular Disease + Valvular Problems/Murmurs AS Rhythm:Regular Rate:Normal  Echo 11/28/13 - Left ventricle: The cavity size was normal. Wall thickness was increased in a pattern of mild LVH. Systolic function was mildlyreduced. The estimated ejection fraction was in the range of 45% to 50%. Diffuse hypokinesis. There is severe hypokinesis of the inferior myocardium. Doppler parameters are consistent with abnormal left ventricular relaxation (grade 1 diastolic dysfunction). - Aortic valve: Valve mobility was restricted. There was severe stenosis. Valve area (Vmax): 0.25 cm^2. - Aorta: Aortic root dimension: 44 mm (ED). - Aortic root: The aortic root was mildly dilated. - Atrial septum: There was an atrial septal aneurysm.     Neuro/Psych    GI/Hepatic   Endo/Other    Renal/GU      Musculoskeletal   Abdominal   Peds  Hematology   Anesthesia Other Findings   Reproductive/Obstetrics                            Anesthesia Physical Anesthesia Plan  ASA: IV  Anesthesia Plan: General   Post-op Pain Management:    Induction: Intravenous  Airway Management Planned: Oral ETT  Additional Equipment: Arterial line, CVP, 3D TEE and PA Cath  Intra-op Plan:   Post-operative Plan: Post-operative intubation/ventilation  Informed Consent: I have reviewed the patients History and Physical, chart, labs and discussed the procedure including the risks,  benefits and alternatives for the proposed anesthesia with the patient or authorized representative who has indicated his/her understanding and acceptance.   Dental advisory given  Plan Discussed with: Anesthesiologist, Surgeon and CRNA  Anesthesia Plan Comments:        Anesthesia Quick Evaluation

## 2013-12-26 NOTE — Transfer of Care (Signed)
Immediate Anesthesia Transfer of Care Note  Patient: Alfred Hall  Procedure(s) Performed: Procedure(s): ASCENDING AORTIC  REPLACEMENT (N/A) INTRAOPERATIVE TRANSESOPHAGEAL ECHOCARDIOGRAM (N/A) LUNG BIOPSY (Left) CORONARY ARTERY BYPASS GRAFTING (CABG) times one using the left internal mammary artery. (N/A) AORTIC VALVE REPLACEMENT (AVR)  Patient Location: SICU  Anesthesia Type:General  Level of Consciousness: Patient remains intubated per anesthesia plan  Airway & Oxygen Therapy: Patient remains intubated per anesthesia plan and Patient placed on Ventilator (see vital sign flow sheet for setting)  Post-op Assessment: Post -op Vital signs reviewed and stable  Post vital signs: Reviewed and stable  Complications: No apparent anesthesia complications

## 2013-12-26 NOTE — Brief Op Note (Addendum)
      [address]       [place] [address]             [phone]     12/26/2013  1:19 PM  PATIENT:  Alfred Hall  80 y.o. male  PRE-OPERATIVE DIAGNOSIS:  1. Severe aortic stenosis 2. Ascending aortic aneurysm 3.Single vessel coronary artery disease  POST-OPERATIVE DIAGNOSIS:   1. Severe aortic stenosis 2. Ascending aortic aneurysm 3.Single vessel coronary artery disease  PROCEDURE:  INTRAOPERATIVE TRANSESOPHAGEAL ECHOCARDIOGRAM,  MEDIAN STERNOTOMY for CORONARY ARTERY BYPASS GRAFTING (CABG) x 1 (LIMA to LAD), REPLACEMENT of   ASCENDING THORACIC AORTA (using a Hema shield straight graft, size 48mm diameter), AORTIC VALVE REPLACEMENT (AVR) (using a Pericardial Tissue Valve, size 25 mm), and LUL lung biopsy  SURGEON:  Surgeon(s) and Role:    * Grace Isaac, MD - Primary  PHYSICIAN ASSISTANT: Lars Pinks PA-C  ANESTHESIA:   general  EBL:  Total I/O In: -  Out: 1600 [Urine:1600]  BLOOD ADMINISTERED:Three CC PRBC  DRAINS: Chest tubes in the mediastinal and pleural spaces   SPECIMEN:  Source of Specimen:  LUL lung biopsy  DISPOSITION OF SPECIMEN:  PATHOLOGY. Frozen consistent with inflammation and fibrosis. Cultures also sent  COUNTS CORRECT:  YES  DICTATION: .Dragon Dictation  PLAN OF CARE: Admit to inpatient   PATIENT DISPOSITION:  ICU - intubated and hemodynamically stable.   Delay start of Pharmacological VTE agent (>24hrs) due to surgical blood loss or risk of bleeding: yes  BASELINE WEIGHT: 64 kg  Aortic Valve Etiology   Aortic Insufficiency:  Trivial/Trace  Aortic Valve Disease:  Yes.  Aortic Stenosis:  Yes. Smallest Aortic Valve Area: 0.25 cm2; Highest Mean Gradient: 44mmHg.  Etiology (Choose at least one and up to  5 etiologies):  Degenerative - Pure annular dilation without leaflet prolapse  Aortic Valve  Procedure Performed:  Replacement: Yes.  Bioprosthetic Valve. Implant Model Number:[id number], Size:25 mm, Unique  Device Identifier:[id number]  Repair/Reconstruction: Yes.  Replacement AV & insertion aortic non-valved conduit Bioprosthetic Valve. Implant Model Number:[id number], Size:25 mm, Unique Device Identifier:[id number]  Aortic Annular Enlargement: Yes.

## 2013-12-26 NOTE — H&P (Signed)
[address]       [place] [address]             [phone]                    Artyom P Hsia [facility] Medical Record #[id number] Date of Birth: [birth year]  Referring: Dr Burt Knack Primary Care: Augustin Coupe, MD  Chief Complaint:   Aortic stenosis, coronary artery disease, dilated ascending aorta, abdominal aortic aneurysm, left lung mass-granulomatous disease by biopsy    History of Present Illness:    Alfred Hall 80 y.o. male  Was  seen in the office for newly diagnosed Lung mass/aortic stenosis/ascending aortic aneurysm/abdominal aortic aneurysm. The patient has been a long-term smoker x60 years with underlying COPD, limited in his pulmonary reserve but not on oxygen at home. He had a known abdominal aortic aneurysms followed at [facility] but not seen since 2013. He had a recent "checkup" at the [facility] in [facility], a chest x-ray suggested a little left upper lobe lung mass. His local provider in [place] arrange CT scan of the chest , PET scan, and echocardiogram. He is referred for further evaluation and deciding on a treatment plan.  In addition to the suspicious left upper lobe lung mass, he's recently been found to have a 5.5 cm ascending aortic aneurysm, critical aortic stenosis by echocardiogram ejection fraction 40-45% peak gradient 77 mean gradient 53, and aortic valve velocity 4.4 m/s, and moderate mitral stenosis. Abdominal aortic aneurysm has increased in size from 4.6 cm in 2013 to 5.2 now.   Surprisingly the patient has few symptoms but is fairly sedentary in his lifestyle. He does care for his ill wife, does light housework grocery shopping, but does not do any yard work around his house because of shortness of breath with exertion. He denies any syncope, denies angina, denies pedal edema. His major limiting symptom is shortness of breath with exertion.  Since last seen the patient has seen cardiology and had cardiac cath  and seen vascular  surgery concerning his abdominal aortic aneurysm. At time of cath he had onset of Afib and stayed overnight.  Needle bx of lung mass done:  Needle bx of lung lesion done: Lung, needle/core biopsy(ies), left upper lobe - FIBROSIS WITH PATCHY LYMPHOID AGGREGATES. - SEE MICROSCOPIC DESCRIPTION. Microscopic Comment The airspaces are mostly replaced by variably dense fibrosis with mild associated inflammation. There are patchy lymphoplasmacytic aggregates predominately around terminal airspaces. Immunohistochemistry shows a predominance of CD20 and CD79a positive B cells with a mixture of peripheral CD3 and CD5 positive T cells. CD10, CD30 and cyclin D1 are negative. There are also a few vessels with peripheral fibrosis and inflammatory cells within the wall of the vessel. The features favor an inflammatory fibrosing process and clinical correlation is essential. Recurrent carcinoma is not identified in these core biopsies. Dr. Gari Crown has reviewed this case and agrees. (JDP:gt, 10/03/13) [name] MD Pathologist, Electronic Signature (Case signed 10/03/2013)  The patient has decided to proceed with replacement of aortic valve and ascending aorta and coronary artery bypass grafting in preparation for repair of abdominal aortic aneurysm. On his last visit reviewed various options with him and gave him material about aortic valve replacement and coronary artery bypass grafting.   Current Activity/ Functional Status:  Patient is independent with mobility/ambulation, transfers, ADL's, IADL's.   Zubrod Score: At the time of surgery this patient's most appropriate activity status/level should be  described as: []     0    Normal activity, no symptoms [x]     1    Restricted in physical strenuous activity but ambulatory, able to do out light work []     2    Ambulatory and capable of self care, unable to do work activities, up and about               >50 % of waking hours                               []     3    Only limited self care, in bed greater than 50% of waking hours []     4    Completely disabled, no self care, confined to bed or chair []     5    Moribund   Past Medical History  Diagnosis Date  . Lung nodule   . PVD (peripheral vascular disease)   . Microhematuria     workup negative  . Aortic aneurysm, abdominal     followed at [facility]  . Hypercholesteremia   . Hypertension   . Valvular heart disease  recent echocardiogram noted above   . Thoracic aortic aneurysm without rupture  recently noted on CT of the chest  by my measurement 5.2 x 5.4     Past Surgical History  Procedure Laterality Date  . Cataract extraction    . Eye surgery      CATARACT  . Throat nodule excision    . Nose surgery    . Cardiac catheterization    . Tonsillectomy    . Appendectomy      Family History  Problem Relation Age of Onset  . Renal Disease Father   . Congestive Heart Failure Mother    patient's mother died of COPD at age 77, father died at age 82 with diabetes, one sister is alive with COPD  History   Social History  . Marital Status: Married    Spouse Name: N/A    Number of Children: 3  . Years of Education: N/A   Occupational History  . retired from [employer]  . Smoking status: Current Every Day Smoker -- 0.50 packs/day for 60 years    Types: Cigarettes  . Smokeless tobacco: Never Used  . Alcohol Use: No  . Drug Use: No  . Sexual Activity: Not on file      History  Smoking status  . Former Smoker -- 0.50 packs/day for 60 years  . Types: Cigarettes  . Quit date: 12/22/2013  Smokeless tobacco  . Never Used    History  Alcohol Use  . Yes    Comment: " occasional beer"     No Known Allergies  Current Facility-Administered Medications  Medication Dose Route Frequency Provider Last Rate Last Dose  . aminocaproic acid (AMICAR) 10 g in sodium chloride 0.9 % 100 mL infusion   Intravenous To OR Grace Isaac, MD      . cefUROXime (ZINACEF) 1.5 g in dextrose 5 % 50 mL IVPB  1.5 g Intravenous To OR Grace Isaac, MD      . cefUROXime (ZINACEF) 750 mg in dextrose 5 % 50 mL IVPB  750 mg Intravenous To OR Grace Isaac, MD      . chlorhexidine (HIBICLENS) 4 % liquid 2 application  30 mL Topical UD Grace Isaac,  MD      . dexmedetomidine (PRECEDEX) 400 MCG/100ML (4 mcg/mL) infusion  0.1-0.7 mcg/kg/hr Intravenous To OR Grace Isaac, MD      . DOPamine (INTROPIN) 800 mg in dextrose 5 % 250 mL (3.2 mg/mL) infusion  0-10 mcg/kg/min Intravenous To OR Grace Isaac, MD      . EPINEPHrine (ADRENALIN) 4 mg in dextrose 5 % 250 mL (0.016 mg/mL) infusion  0.5-20 mcg/min Intravenous To OR Grace Isaac, MD      . heparin 2,500 Units, papaverine 30 mg in electrolyte-148 (PLASMALYTE-148) 500 mL irrigation   Irrigation To OR Grace Isaac, MD      . heparin 30,000 units/NS 1000 mL solution for CELLSAVER   Other To OR Grace Isaac, MD      . insulin regular (NOVOLIN R,HUMULIN R) 250 Units in sodium chloride 0.9 % 250 mL (1 Units/mL) infusion   Intravenous To OR Grace Isaac, MD      . magnesium sulfate (IV Push/IM) injection 40 mEq  40 mEq Other To OR Grace Isaac, MD      . nitroGLYCERIN 50 mg in dextrose 5 % 250 mL (0.2 mg/mL) infusion  2-200 mcg/min Intravenous To OR Grace Isaac, MD      . phenylephrine (NEO-SYNEPHRINE) 20 mg in dextrose 5 % 250 mL (0.08 mg/mL) infusion  30-200 mcg/min Intravenous To OR Grace Isaac, MD      . potassium chloride injection 80 mEq  80 mEq Other To OR Grace Isaac, MD      . vancomycin (VANCOCIN) 1,250 mg in sodium chloride 0.9 % 250 mL IVPB  1,250 mg Intravenous To OR Grace Isaac, MD       Facility-Administered Medications Ordered in Other Encounters  Medication Dose Route Frequency Provider Last Rate Last Dose  . fentaNYL (SUBLIMAZE) injection    Anesthesia Intra-op Carola Frost, CRNA   50 mcg at 12/26/13  [phone]  . lactated ringers infusion    Continuous PRN Carola Frost, CRNA      . lactated ringers infusion    Continuous PRN Carola Frost, CRNA      . lactated ringers infusion    Continuous PRN Carola Frost, CRNA      . midazolam (VERSED) 5 MG/5ML injection    Anesthesia Intra-op Carola Frost, CRNA   1 mg at 12/26/13 [id number]     Review of Systems:     Cardiac Review of Systems: Y or N  Chest Pain [  n  ]  Resting SOB [n   ] Exertional SOB  Blue.Reese  ]  Vertell Limber Florencio.Farrier  ]   Pedal Edema [ n  ]    Palpitations [ n ] Syncope  [n  ]   Presyncope [n   ]  General Review of Systems: [Y] = yes [  ]=no Constitional: recent weight change [n  ];  Wt loss over the last 3 months [   ] anorexia [  ]; fatigue Blue.Reese  ]; nausea [n  ]; night sweats [ n ]; fever [n  ]; or chills [n  ];          Dental: poor dentition[ dentures ]; Last Dentist visit:   Eye : blurred vision [  ]; diplopia [   ]; vision changes [  ];  Amaurosis fugax[  ]; Resp: cough [  ];  wheezing[  ];  hemoptysis[  ]; shortness of breath[ y ];  paroxysmal nocturnal dyspnea[ n ]; dyspnea on exertion[ y ]; or orthopnea[ n ];  GI:  gallstones[  ], vomiting[n  ];  dysphagia[  ]; melena[  ];  hematochezia [  ]; heartburn[  ];   Hx of  Colonoscopy[ n ]; GU: kidney stones [n  ]; hematuria[ n ];   dysuria [  ];  nocturia[  ];  history of     obstruction [  ]; urinary frequency [  ]             Skin: rash, swelling[  ];, hair loss[  ];  peripheral edema[ n ];  or itching[  ]; Musculosketetal: myalgias[  ];  joint swelling[  ];  joint erythema[  ];  joint pain[  ];  back pain[  ];  Heme/Lymph: bruising[ y ];  bleeding[  ];  anemia[  ];  Neuro: TIA[  ];  headaches[  ];  stroke[  ];  vertigo[  ];  seizures[  ];   paresthesias[  ];  difficulty walking[ n ];  Psych:depression[  ]; anxiety[  ];  Endocrine: diabetes[n  ];  thyroid dysfunction[n  ];  Immunizations: Flu up to date Blue.Reese  ]; Pneumococcal up to date [ y ];  Other:  Physical Exam: BP 149/90  mmHg  Pulse 79  Temp(Src) 98 F (36.7 C) (Oral)  Resp 20  Wt 147 lb 12.8 oz (67.042 kg)  SpO2 100%  PHYSICAL EXAMINATION:  General appearance: alert, cooperative, appears stated age and no distress Neurologic: intact Heart: regular rate and rhythm and systolic murmur: holosystolic 4/6, crescendo throughout the precordium Lungs: clear to auscultation bilaterally Abdomen: soft, non-tender; bowel sounds normal; no masses,  no organomegaly Extremities: extremities normal, atraumatic, no cyanosis or edema and Homans sign is negative, no sign of DVT Patient has no cervical or supraclavicular adenopathy   Diagnostic Studies & Laboratory data:     Recent Radiology Findings:    CT THORAX(CHEST) W/IV CONT. . Aug 23, 2013 10:44:49 AM HISTORY: left lobe mass 518.89 OTHER DISEASES OF LUNG, NOT ELSEWHERE CLASSIFIED COMPARISON: None TECHNIQUE: CT of the chest was performed following IV contrast administration (168ml Isovue).  FINDINGS: Mediastinum: 1.5 cm right thyroid nodule with internal calcifications . Ascending aorta is dilated measuring 5.1 cm. Severe aortic valve plane calcifications are present. There is severe descending aortic atherosclerosis with ulcerated plaques image 57. Esophagus appears normal. No mm adenopathy.  Limited abdomen: Simple attenuation left renal cyst. Otherwise negative.  Musculoskeletal: Bones osteopenic. Minimal spinal curvature.  Lungs: Polyps or more likely extensive debris within the trachea. Severe emphysema. Mild bronchial wall thickening. 2.0 cm spiculated nodule in the left upper lobe. There is a small amount of adjacent pneumonitis. This abuts the major fissure and is distant from the carina. No effusion.  Impression: 1. 2.0 cm nodule in the left upper lobe compatible with bronchogenic carcinoma. No adenopathy by size criteria or sign of distant metastasis. 2. Aortic stenosis with aneurysmal dilatation of the ascending aorta. 3. Severe emphysema. 4.  Severe atherosclerosis. 1.57 calcified right thyroid nodule.. 5. Polyps or debris within the trachea. Debris is favored.   NM PET/CT SKULL BASE TO MID THIGH. Aug 28, 2013 08:30:00 PM  Clinical Information: 793.11 SOLITARY PULMONARY NODULE 2CT  Radiopharmaceutical: 12.60 mCi of F-18 Fluorodeoxyglucose, IV.  Technique: Images from skull base to proximal femora were obtained and reconstructed in the sagittal, coronal and axial planes. CT localizer scan was performed with CT fusion images evaluated in the workstation.  The patient's  blood Glucose level measures 96 mg/dl. The patient stands 5 ft 8 inches and weighs 145 lbs. Uptake time: 60 min. CTDI: 6.39 mGy.  Findings: There is no prior PET scan for comparison. The 2 cm spiculated nodule in in the left upper lobe shows marked FDG accumulation with a mean uptake of 14.20, maximal 20.95 SUV by bodyweight. No mediastinal or hilar nodal uptake is seen. There is minimal uptake in the oral pharyngeal area which is physiologic. There is also physiologic uptake within the myocardium, the liver, spleen, kidneys with tracer accumulation within the urinary bladder. Activity with the bowel lumen appears to be luminal.  There is an aneurysm of the ascending aorta measuring 5.9 cm x 5.3 cm. A right parapelvic cyst is present. There is a 5 cm low density oval mass in the upper pole of the left kidney with a mean attenuation value of 13 Hounsfield units. Cholecystectomy clips are seen. There is a large infrarenal abdominal aneurysm which does not extend into the bifurcation measuring 5.2 cm x 5.1 cm. Mild dilatation of the right common iliac artery is seen. The prostate gland is enlarged with some coarse calcification within it. Possible hydroceles in the scrotum.  Impression: 1. Marked FDG uptake in the left upper lobe nodule compatible with a malignant neoplastic process. 2. No definite evidence of mediastinal or hilar metastases. No suspicious uptake in the  neck, abdomen or pelvis. 3. There is an aneurysm of the ascending aorta measuring 5.9 cm x 5.3 cm. Large infrarenal abdominal aortic aneurysm measures 5.2 cm x 5.1 cm. 4. Mild dilatation of the proximal right common iliac artery. 5. Enlarged prostate gland. There may be hydrocele in the scrotum.   Cardiac CAth:' Procedural Findings:  Hemodynamics  RA 4  RV 23/2  PA 23/7 mean 11  PCWP not recorded  LV not recorded  AO 107/53  Oxygen saturations:  PA 69  AO 96  Cardiac Output (Fick) 4.5  Cardiac Index (Fick) 2.5  Coronary angiography:  Coronary dominance: left  Left mainstem: The left main is short, heavily calcified, with minor 20% stenosis noted.  Left anterior descending (LAD): The LAD is moderately calcified. There is diffuse 60-70% stenosis throughout the mid LAD. The vessel wraps around the left ventricular apex. The first septal perforator is patent. There is a tiny diagonal branch arising from the LAD.  Left circumflex (LCx): The left circumflex is dominant. The vessel is large in caliber. The proximal vessel has 40% irregular stenosis. There is an intermediate branch without significant disease. The obtuse marginal branches are patent without significant disease. The left PDA branch is patent without significant disease.  Right coronary artery (RCA): Small, nondominant vessel supplying 2 small RV marginal  Aortic root angiography: The aortic valve has a very severe calcification with restricted motion. There is mild aortic insufficiency. The aortic root and ascending aorta are dilated.  Estimated Blood Loss: Minimal  Final Conclusions:  1. Known severe aortic stenosis with a severely calcified and restricted aortic valve on plain fluoroscopy  2. Mild to moderate CAD with moderate mid LAD stenosis and nonobstructive left circumflex stenosis and a left dominant coronary system  3. Normal right heart pressures  4. Ascending aortic aneurysm      Recent Lab Findings: Lab  Results  Component Value Date   WBC 7.3 12/22/2013   HGB 13.0 12/22/2013   HCT 39.3 12/22/2013   PLT 200 12/22/2013   GLUCOSE 87 12/22/2013   ALT 10 12/22/2013   AST 18 12/22/2013  NA 139 12/22/2013   K 4.0 12/22/2013   CL 98 12/22/2013   CREATININE 1.29 12/22/2013   BUN 27* 12/22/2013   CO2 27 12/22/2013   INR 1.07 12/22/2013   HGBA1C 6.0* 12/22/2013   PFT's FEV1  1.49   59%  DLCO 12.7   42%  ECHO: LV EF: 45% -  50%  ------------------------------------------------------------------- Indications:   Aortic stenosis 424.1.  ------------------------------------------------------------------- History:  PMH:  Coronary artery disease. Aortic valve disease. Risk factors: Current tobacco use. Hypertension. Hypercholesterolemia.  ------------------------------------------------------------------- Study Conclusions  - Left ventricle: The cavity size was normal. Wall thickness was increased in a pattern of mild LVH. Systolic function was mildly reduced. The estimated ejection fraction was in the range of 45% to 50%. Diffuse hypokinesis. There is severe hypokinesis of the inferior myocardium. Doppler parameters are consistent with abnormal left ventricular relaxation (grade 1 diastolic dysfunction). - Aortic valve: Valve mobility was restricted. There was severe stenosis. Valve area (Vmax): 0.25 cm^2. - Aorta: Aortic root dimension: 44 mm (ED). - Aortic root: The aortic root was mildly dilated. - Atrial septum: There was an atrial septal aneurysm.  Impressions:  - Global hypokinesis most prominent in the inferior wall; overall EF 45; grade 1 diastolic dysfunction; heavily calcified aortic valve with reduced cusp excursion; severe AS with mean gradient of 56 mmHg; dilated aortic root (44 mm).  Transthoracic echocardiography. M-mode, complete 2D, spectral Doppler, and color Doppler. Birthdate: Patient birthdate: Feb 21, 1929. Age: Patient is  80 yr old. Sex: Gender: male. BMI: 21.3 kg/m^2. Blood pressure:   129/79 Patient status: Outpatient. Study date: Study date: 11/28/2013. Study time: 04:19 PM. Location: Echo laboratory.  -------------------------------------------------------------------  ------------------------------------------------------------------- Left ventricle: The cavity size was normal. Wall thickness was increased in a pattern of mild LVH. Systolic function was mildly reduced. The estimated ejection fraction was in the range of 45% to 50%. Diffuse hypokinesis. Regional wall motion abnormalities: There is severe hypokinesis of the inferior myocardium. Doppler parameters are consistent with abnormal left ventricular relaxation (grade 1 diastolic dysfunction).  ------------------------------------------------------------------- Aortic valve:  Severely calcified leaflets. Valve mobility was restricted. Doppler:  There was severe stenosis.  There was no regurgitation.  VTI ratio of LVOT to aortic valve: 0.16. Indexed valve area (VTI): 0.18 cm^2/m^2. Peak velocity ratio of LVOT to aortic valve: 0.13. Valve area (Vmax): 0.25 cm^2. Indexed valve area (Vmax): 0.14 cm^2/m^2. Mean velocity ratio of LVOT to aortic valve: 0.14. Indexed valve area (Vmean): 0.16 cm^2/m^2.  Mean gradient (S): 56 mm Hg. Peak gradient (S): 88 mm Hg.  ------------------------------------------------------------------- Aorta: Aortic root: The aortic root was mildly dilated.  ------------------------------------------------------------------- Mitral valve:  Structurally normal valve.  Mobility was not restricted. Doppler: Transvalvular velocity was within the normal range. There was no evidence for stenosis. There was no regurgitation.  ------------------------------------------------------------------- Left atrium: The atrium was normal in  size.  ------------------------------------------------------------------- Atrial septum: There was an atrial septal aneurysm.  ------------------------------------------------------------------- Right ventricle: The cavity size was normal. Systolic function was normal.  ------------------------------------------------------------------- Pulmonic valve:  Doppler: Transvalvular velocity was within the normal range. There was no evidence for stenosis. There was trivial regurgitation.  ------------------------------------------------------------------- Tricuspid valve:  Structurally normal valve.  Doppler: Transvalvular velocity was within the normal range. There was trivial regurgitation.  ------------------------------------------------------------------- Right atrium: The atrium was normal in size.  ------------------------------------------------------------------- Pericardium: There was no pericardial effusion.  ------------------------------------------------------------------- Systemic veins: Inferior vena cava: The vessel was normal in size.  ------------------------------------------------------------------- Measurements  Left ventricle  Value     Reference LV ID, ED, PLAX chordal          46  mm    43 - 52 LV ID, ES, PLAX chordal          38  mm    23 - 38 LV fx shortening, PLAX chordal  (L)   17  %    >=29 LV PW thickness, ED            11  mm    --------- IVS/LV PW ratio, ED            1.09      <=1.3 Stroke volume, 2D             35  ml    --------- Stroke volume/bsa, 2D           20  ml/m^2  --------- LV e&', lateral              6.96 cm/s   --------- LV E/e&', lateral             7.87      --------- LV e&', medial               5.11 cm/s   --------- LV E/e&',  medial              10.72     --------- LV e&', average              6.04 cm/s   --------- LV E/e&', average             9.08      ---------  Ventricular septum            Value     Reference IVS thickness, ED             12  mm    ---------  LVOT                   Value     Reference LVOT ID, S                16  mm    --------- LVOT area                 2.01 cm^2   --------- LVOT peak velocity, S           58.7 cm/s   --------- LVOT mean velocity, S           48.1 cm/s   --------- LVOT VTI, S                17.5 cm    --------- LVOT peak gradient, S           1   mm Hg  ---------  Aortic valve               Value     Reference Aortic valve peak velocity, S       469  cm/s   --------- Aortic valve mean velocity, S       343  cm/s   --------- Aortic valve VTI, S            111  cm    --------- Aortic mean gradient, S          56  mm Hg  --------- Aortic peak gradient, S          88  mm Hg  --------- VTI  ratio, LVOT/AV            0.16      --------- Aortic valve area/bsa, VTI        0.18 cm^2/m^2 --------- Velocity ratio, peak, LVOT/AV       0.13      --------- Aortic valve area, peak velocity     0.25 cm^2   --------- Aortic valve area/bsa, peak        0.14 cm^2/m^2 --------- velocity Velocity ratio, mean, LVOT/AV       0.14      --------- Aortic valve area/bsa, mean        0.16 cm^2/m^2 --------- velocity  Aorta                   Value     Reference Aortic root ID, ED            44  mm    ---------  Left atrium                Value      Reference LA ID, A-P, ES              25  mm    --------- LA ID/bsa, A-P              1.43 cm/m^2  <=2.2 LA volume, ES, 1-p A4C          19.5 ml    --------- LA volume/bsa, ES, 1-p A4C        11.2 ml/m^2  --------- LA volume, ES, 1-p A2C          34.8 ml    --------- LA volume/bsa, ES, 1-p A2C        20  ml/m^2  ---------  Mitral valve               Value     Reference Mitral E-wave peak velocity        54.8 cm/s   --------- Mitral A-wave peak velocity        107  cm/s   --------- Mitral deceleration time     (H)   356  ms    150 - 230 Mitral E/A ratio, peak          0.5      ---------  Legend: (L) and (H) mark values outside specified reference range.  ------------------------------------------------------------------- Prepared and Electronically Authenticated by  Kirk Ruths 2015-10-13T17:33:43  Chronic Kidney Disease     Stage IIIB GFR 30-44    Lab Results  Component Value Date   CREATININE 1.29 12/22/2013   Estimated Creatinine Clearance: 41.8 mL/min (by C-G formula based on Cr of 1.29).  Assessment / Plan:   I have further discuss with the patient and his daughter the extensive nature of his current medical problems including ascending aortic aneurysm severe aortic stenosis moderate coronary artery disease underlying COPD at least moderate and slowly enlarging abdominal aortic aneurysm. I've discussed with the patient and his daughter the risks of surgical intervention versus the risk of his numerous problems any of which could be  Life limiting  The patient has decided he would like to proceed with replacement of his aortic valve and ascending aorta and coronary artery bypass grafting, in preparation for abdominal aortic aneurysm repair at a later time.  The goals risks and alternatives of the planned  surgical procedure AVR, CABG,replacement of ascending aorta  have been discussed with the patient in detail. The risks of the procedure  including death, heart block and need for pacemaker,  infection, stroke, myocardial infarction, bleeding, blood transfusion have all been discussed specifically.  I have quoted Alfred Hall a 8 % of perioperative mortality and a complication rate as high as 40 %. The patient's questions have been answered.Alfred Hall is willing  to proceed with the planned procedure. Grace Isaac MD      [address],[place] [address] Office [phone]   Beeper [phone]  12/26/2013 6:57 AM

## 2013-12-26 NOTE — Progress Notes (Signed)
CRITICAL VALUE ALERT  Critical value received:  hgb 6.5  Date of notification:  12/26/2013   Time of notification:  1918  Critical value read back:Yes.    Nurse who received alert:  Sandre Kitty   MD notified (1st page):  Roxan Hockey  Time of first page: 1920   MD notified (2nd page):  Time of second page:  Responding MD:  [email or web address]  Time MD responded:  [phone]

## 2013-12-26 NOTE — Progress Notes (Signed)
CTSP for large amount of drainage from chest tubes  Mr. Weitzel had a severe coughing spell a few minutes ago. When he coughed his BP went down into the 70s and he had ~ 300 ml of dark blood drain from the mediastinal chest tubes. He did cough up a large clot from the ETT.  His BP rapidly returned to baseline.  There is no ongoing drainage from the mediastinal CT- I suspect when he coughed he forced out old blood in the mediastinum.  Will observe closely. Recheck CBC and coags

## 2013-12-26 NOTE — Progress Notes (Signed)
Pt awoke and coughed.  A large clot was noted to be coughed up from his ETT as well as increased drainage from hi MT/PT tubes.  Approximately 450cc of dark red clotty blood noted.  Dr. Roxan Hockey paged.

## 2013-12-26 NOTE — Progress Notes (Signed)
S/p AVR, repair ascending aneurysm, lung biopsy  BP 91/47 mmHg  Pulse 92  Temp(Src) 98.2 F (36.8 C) (Core (Comment))  Resp 16  Wt 147 lb 12.8 oz (67.042 kg)  SpO2 100%  CI = 1.8 on dopamine at 3, levophed at 10 and neo at 100   Intake/Output Summary (Last 24 hours) at 12/26/13 1936 Last data filed at 12/26/13 1900  Gross per 24 hour  Intake 6918.8 ml  Output   6217 ml  Net  701.8 ml    High CT output initially  CBC    Component Value Date/Time   WBC 13.3* 12/26/2013 1830   WBC 7.7 09/12/2013 1648   RBC 2.25* 12/26/2013 1830   RBC 4.86 09/12/2013 1648   HGB 6.5* 12/26/2013 1830   HCT 18.9* 12/26/2013 1830   PLT 121* 12/26/2013 1830   MCV 84.0 12/26/2013 1830   MCH 28.9 12/26/2013 1830   MCH 29.4 09/12/2013 1648   MCHC 34.4 12/26/2013 1830   MCHC 34.2 09/12/2013 1648   RDW 13.8 12/26/2013 1830   RDW 14.8 09/12/2013 1648   LYMPHSABS 1.6 09/12/2013 1648   EOSABS 0.2 09/12/2013 1648   BASOSABS 0.0 09/12/2013 1648   INR = 1.98 PTT= 58 Hct= 19 PLT= 121  Left CT placed for white out left lung. No significant change in CXR despite good position of tube and 500 ml out after placement  On 100% and 8 of PEEP  Multiple issues at present  Anemia secondary to ABL- transfuse 2 units Thrombocytopenia- improved Coagulopathy better but PT and PTT still elevated- give 2 additional units of FFP Postoperative bleeding- responding to treatment White out- appears to be parenchymal rather than a hemothorax. If a hemothorax is likely clotted as there are 2 tubes in good position with minimal drainage at present Oxygenation limited by left lung issue

## 2013-12-26 NOTE — Progress Notes (Signed)
Dr. Servando Snare and Dr. Kalman Shan from anesthesia at bedside and attempted to increase PEEP to +10 due to Spo2 89-90%. After increasing peep, pt desat to 85%, RT then returned peep to +5 of peep and pt returned to 90-91%. Pt remains on 100% RT will continue to monitor.

## 2013-12-26 NOTE — Anesthesia Procedure Notes (Signed)
Procedures   Time is incorrect on machine.  Should read 0640. RIJ vein image with needle.

## 2013-12-26 NOTE — Progress Notes (Signed)
Pt was noted to cough again with chest tube output of 160cc.  Output was dark red and clotty.  Will continue to monitor.

## 2013-12-26 NOTE — Progress Notes (Signed)
Dr. Roxan Hockey to bedside.

## 2013-12-26 NOTE — Progress Notes (Signed)
Utilization Review Completed.Donne Anon T11/11/2013

## 2013-12-27 ENCOUNTER — Inpatient Hospital Stay (HOSPITAL_COMMUNITY): Payer: Medicare Other

## 2013-12-27 ENCOUNTER — Encounter (HOSPITAL_COMMUNITY): Payer: Self-pay | Admitting: Cardiothoracic Surgery

## 2013-12-27 LAB — GLUCOSE, CAPILLARY
Glucose-Capillary: 101 mg/dL — ABNORMAL HIGH (ref 70–99)
Glucose-Capillary: 104 mg/dL — ABNORMAL HIGH (ref 70–99)
Glucose-Capillary: 108 mg/dL — ABNORMAL HIGH (ref 70–99)
Glucose-Capillary: 109 mg/dL — ABNORMAL HIGH (ref 70–99)
Glucose-Capillary: 115 mg/dL — ABNORMAL HIGH (ref 70–99)
Glucose-Capillary: 116 mg/dL — ABNORMAL HIGH (ref 70–99)
Glucose-Capillary: 119 mg/dL — ABNORMAL HIGH (ref 70–99)
Glucose-Capillary: 122 mg/dL — ABNORMAL HIGH (ref 70–99)
Glucose-Capillary: 133 mg/dL — ABNORMAL HIGH (ref 70–99)
Glucose-Capillary: 135 mg/dL — ABNORMAL HIGH (ref 70–99)
Glucose-Capillary: 137 mg/dL — ABNORMAL HIGH (ref 70–99)
Glucose-Capillary: 152 mg/dL — ABNORMAL HIGH (ref 70–99)
Glucose-Capillary: 81 mg/dL (ref 70–99)
Glucose-Capillary: 82 mg/dL (ref 70–99)
Glucose-Capillary: 89 mg/dL (ref 70–99)
Glucose-Capillary: 91 mg/dL (ref 70–99)
Glucose-Capillary: 98 mg/dL (ref 70–99)

## 2013-12-27 LAB — PREPARE PLATELET PHERESIS: Unit division: 0

## 2013-12-27 LAB — POCT I-STAT 3, ART BLOOD GAS (G3+)
Acid-base deficit: 2 mmol/L (ref 0.0–2.0)
Bicarbonate: 23.1 mEq/L (ref 20.0–24.0)
O2 Saturation: 93 %
Patient temperature: 38
TCO2: 24 mmol/L (ref 0–100)
pCO2 arterial: 40.5 mmHg (ref 35.0–45.0)
pH, Arterial: 7.369 (ref 7.350–7.450)
pO2, Arterial: 74 mmHg — ABNORMAL LOW (ref 80.0–100.0)

## 2013-12-27 LAB — BASIC METABOLIC PANEL
ANION GAP: 11 (ref 5–15)
BUN: 18 mg/dL (ref 6–23)
CALCIUM: 7.2 mg/dL — AB (ref 8.4–10.5)
CO2: 23 mEq/L (ref 19–32)
Chloride: 104 mEq/L (ref 96–112)
Creatinine, Ser: 1.06 mg/dL (ref 0.50–1.35)
GFR calc non Af Amer: 63 mL/min — ABNORMAL LOW (ref >90)
GFR, EST AFRICAN AMERICAN: 73 mL/min — AB (ref >90)
Glucose, Bld: 83 mg/dL (ref 70–99)
Potassium: 4.7 mEq/L (ref 3.7–5.3)
Sodium: 138 mEq/L (ref 137–147)

## 2013-12-27 LAB — CBC
HCT: 27.6 % — ABNORMAL LOW (ref 39.0–52.0)
HCT: 30.2 % — ABNORMAL LOW (ref 39.0–52.0)
HEMOGLOBIN: 10.6 g/dL — AB (ref 13.0–17.0)
Hemoglobin: 9.7 g/dL — ABNORMAL LOW (ref 13.0–17.0)
MCH: 28.9 pg (ref 26.0–34.0)
MCH: 29.7 pg (ref 26.0–34.0)
MCHC: 35.1 g/dL (ref 30.0–36.0)
MCHC: 35.1 g/dL (ref 30.0–36.0)
MCV: 82.1 fL (ref 78.0–100.0)
MCV: 84.6 fL (ref 78.0–100.0)
PLATELETS: 80 10*3/uL — AB (ref 150–400)
Platelets: 72 10*3/uL — ABNORMAL LOW (ref 150–400)
RBC: 3.36 MIL/uL — ABNORMAL LOW (ref 4.22–5.81)
RBC: 3.57 MIL/uL — ABNORMAL LOW (ref 4.22–5.81)
RDW: 14 % (ref 11.5–15.5)
RDW: 14.3 % (ref 11.5–15.5)
WBC: 11.1 10*3/uL — ABNORMAL HIGH (ref 4.0–10.5)
WBC: 13.4 10*3/uL — ABNORMAL HIGH (ref 4.0–10.5)

## 2013-12-27 LAB — PREPARE FRESH FROZEN PLASMA
UNIT DIVISION: 0
Unit division: 0
Unit division: 0

## 2013-12-27 LAB — PROTIME-INR
INR: 1.47 (ref 0.00–1.49)
Prothrombin Time: 18 seconds — ABNORMAL HIGH (ref 11.6–15.2)

## 2013-12-27 LAB — POCT I-STAT, CHEM 8
BUN: 15 mg/dL (ref 6–23)
BUN: 18 mg/dL (ref 6–23)
Calcium, Ion: 1.04 mmol/L — ABNORMAL LOW (ref 1.13–1.30)
Calcium, Ion: 1.21 mmol/L (ref 1.13–1.30)
Chloride: 106 mEq/L (ref 96–112)
Chloride: 103 mEq/L (ref 96–112)
Creatinine, Ser: 1.1 mg/dL (ref 0.50–1.35)
Creatinine, Ser: 1.2 mg/dL (ref 0.50–1.35)
Glucose, Bld: 132 mg/dL — ABNORMAL HIGH (ref 70–99)
Glucose, Bld: 141 mg/dL — ABNORMAL HIGH (ref 70–99)
HCT: 20 % — ABNORMAL LOW (ref 39.0–52.0)
HCT: 28 % — ABNORMAL LOW (ref 39.0–52.0)
Hemoglobin: 6.8 g/dL — CL (ref 13.0–17.0)
Hemoglobin: 9.5 g/dL — ABNORMAL LOW (ref 13.0–17.0)
Potassium: 4.6 mEq/L (ref 3.7–5.3)
Potassium: 4.6 mEq/L (ref 3.7–5.3)
Sodium: 134 mEq/L — ABNORMAL LOW (ref 137–147)
Sodium: 131 mEq/L — ABNORMAL LOW (ref 137–147)
TCO2: 22 mmol/L (ref 0–100)
TCO2: 23 mmol/L (ref 0–100)

## 2013-12-27 LAB — APTT: aPTT: 40 seconds — ABNORMAL HIGH (ref 24–37)

## 2013-12-27 LAB — MAGNESIUM
MAGNESIUM: 2.4 mg/dL (ref 1.5–2.5)
Magnesium: 2.7 mg/dL — ABNORMAL HIGH (ref 1.5–2.5)

## 2013-12-27 LAB — CREATININE, SERUM
Creatinine, Ser: 1.04 mg/dL (ref 0.50–1.35)
GFR calc Af Amer: 75 mL/min — ABNORMAL LOW (ref >90)
GFR calc non Af Amer: 65 mL/min — ABNORMAL LOW (ref >90)

## 2013-12-27 LAB — PREPARE RBC (CROSSMATCH)

## 2013-12-27 MED ORDER — CALCIUM CHLORIDE 10 % IV SOLN
1.0000 g | Freq: Once | INTRAVENOUS | Status: AC
  Administered 2013-12-27: 1 g via INTRAVENOUS
  Filled 2013-12-27: qty 10

## 2013-12-27 MED ORDER — DOPAMINE-DEXTROSE 3.2-5 MG/ML-% IV SOLN
0.0000 ug/kg/min | INTRAVENOUS | Status: DC
  Administered 2013-12-27: 8 ug/kg/min via INTRAVENOUS
  Filled 2013-12-27: qty 250

## 2013-12-27 MED ORDER — ALBUMIN HUMAN 5 % IV SOLN
12.5000 g | Freq: Once | INTRAVENOUS | Status: AC
  Administered 2013-12-27: 12.5 g via INTRAVENOUS
  Filled 2013-12-27: qty 250

## 2013-12-27 MED ORDER — INSULIN ASPART 100 UNIT/ML ~~LOC~~ SOLN
0.0000 [IU] | SUBCUTANEOUS | Status: DC
  Administered 2013-12-27 – 2013-12-29 (×5): 2 [IU] via SUBCUTANEOUS

## 2013-12-27 MED ORDER — SODIUM CHLORIDE 0.9 % IV SOLN
Freq: Once | INTRAVENOUS | Status: AC
  Administered 2013-12-27: 12:00:00 via INTRAVENOUS

## 2013-12-27 MED ORDER — SODIUM CHLORIDE 0.9 % IV SOLN: INTRAVENOUS | Status: DC | PRN

## 2013-12-27 MED FILL — Potassium Chloride Inj 2 mEq/ML: INTRAVENOUS | Qty: 40 | Status: AC

## 2013-12-27 MED FILL — Heparin Sodium (Porcine) Inj 1000 Unit/ML: INTRAMUSCULAR | Qty: 30 | Status: AC

## 2013-12-27 MED FILL — Magnesium Sulfate Inj 50%: INTRAMUSCULAR | Qty: 10 | Status: AC

## 2013-12-27 NOTE — Progress Notes (Signed)
Patient ID: Alfred Hall, male   DOB: 1929-09-20, 80 y.o.   MRN: [id number] TCTS DAILY ICU PROGRESS NOTE                   [address]            [place] [address]          [phone]   1 Day Post-Op Procedure(s) (LRB): ASCENDING AORTIC  REPLACEMENT (N/A) INTRAOPERATIVE TRANSESOPHAGEAL ECHOCARDIOGRAM (N/A) LUNG BIOPSY (Left) CORONARY ARTERY BYPASS GRAFTING (CABG) times one using the left internal mammary artery. (N/A) AORTIC VALVE REPLACEMENT (AVR)  Total Length of Stay:  LOS: 1 day   Subjective: Remains sedated on vent until chest xray clears  Objective: Vital signs in last 24 hours: Temp:  [94.3 F (34.6 C)-100.6 F (38.1 C)] 100 F (37.8 C) (11/11 0715) Pulse Rate:  [79-106] 102 (11/11 0738) Cardiac Rhythm:  [-] Normal sinus rhythm (11/11 0800) Resp:  [14-30] 23 (11/11 0738) BP: (78-144)/(29-66) 90/45 mmHg (11/11 0738) SpO2:  [86 %-100 %] 100 % (11/11 0738) FiO2 (%):  [60 %-100 %] 60 % (11/11 0800) Weight:  [171 lb 11.8 oz (77.9 kg)] 171 lb 11.8 oz (77.9 kg) (11/11 0500)  Filed Weights   12/25/13 1239 12/27/13 0500  Weight: 147 lb 12.8 oz (67.042 kg) 171 lb 11.8 oz (77.9 kg)    Weight change: 23 lb 15 oz (10.858 kg)   Hemodynamic parameters for last 24 hours: PAP: (20-37)/(9-22) 21/15 mmHg CO:  [2.6 L/min-4.1 L/min] 2.9 L/min CI:  [1.4 L/min/m2-2.3 L/min/m2] 1.6 L/min/m2  Intake/Output from previous day: 11/10 0701 - 11/11 0700 In: 10341.6 [I.V.:4558.1; Blood:2953.5; NG/GT:30; IV Piggyback:2800] Out: 9032 [Urine:4630; Blood:1000; Chest Tube:3402]  Intake/Output this shift: Total I/O In: 135.6 [I.V.:105.6; NG/GT:30] Out: 270 [Urine:150; Chest Tube:120]  Current Meds: Scheduled Meds: . acetaminophen  1,000 mg Oral 4 times per day   Or  . acetaminophen (TYLENOL) oral liquid 160 mg/5 mL  1,000 mg Per Tube 4 times per day  . antiseptic oral rinse  7 mL Mouth Rinse QID  . aspirin EC  325 mg Oral Daily   Or  . aspirin  324 mg Per Tube  Daily  . atorvastatin  20 mg Oral q1800  . bisacodyl  10 mg Oral Daily   Or  . bisacodyl  10 mg Rectal Daily  . cefUROXime (ZINACEF)  IV  1.5 g Intravenous Q12H  . chlorhexidine  15 mL Mouth Rinse BID  . docusate sodium  200 mg Oral Daily  . famotidine (PEPCID) IV  20 mg Intravenous Q12H  . insulin aspart  0-24 Units Subcutaneous 6 times per day  . insulin regular  0-10 Units Intravenous TID WC  . metoprolol tartrate  12.5 mg Oral BID   Or  . metoprolol tartrate  12.5 mg Per Tube BID  . multivitamin with minerals  1 tablet Oral Daily  . [START ON 12/28/2013] pantoprazole  40 mg Oral Daily  . sodium chloride  3 mL Intravenous Q12H   Continuous Infusions: . sodium chloride 20 mL/hr (12/26/13 1515)  . sodium chloride 20 mL/hr (12/26/13 1515)  . sodium chloride    . dexmedetomidine 0.7 mcg/kg/hr (12/27/13 0700)  . DOPamine 8 mcg/kg/min (12/27/13 0700)  . insulin (NOVOLIN-R) infusion Stopped (12/27/13 0400)  . lactated ringers 20 mL/hr (12/26/13 1515)  . nitroGLYCERIN Stopped (12/26/13 1600)  . norepinephrine (LEVOPHED) Adult infusion 5 mcg/min (12/27/13 0825)  . phenylephrine (NEO-SYNEPHRINE) Adult infusion 90 mcg/min (12/27/13 0700)  PRN Meds:.metoprolol, midazolam, morphine injection, ondansetron (ZOFRAN) IV, oxyCODONE, sodium chloride, traMADol  General appearance: sedated Neurologic: sedated Heart: regular rate and rhythm, S1, S2 normal, no murmur, click, rub or gallop Lungs: diminished breath sounds LLL and LUL Abdomen: soft, non-tender; bowel sounds normal; no masses,  no organomegaly Extremities: extremities normal, atraumatic, no cyanosis or edema and Homans sign is negative, no sign of DVT Wound: sternum dressing intact, sternum stable No air leak from chest tubes  Lab Results: CBC: Recent Labs  12/26/13 2230 12/26/13 2233 12/27/13 0400  WBC 8.6  --  11.1*  HGB 8.0* 6.8* 9.7*  HCT 22.7* 20.0* 27.6*  PLT 89*  --  80*   BMET:  Recent Labs  12/26/13 2233  12/27/13 0400  NA 134* 138  K 4.6 4.7  CL 106 104  CO2  --  23  GLUCOSE 132* 83  BUN 15 18  CREATININE 1.10 1.06  CALCIUM  --  7.2*    PT/INR:  Recent Labs  12/27/13 0400  LABPROT 18.0*  INR 1.47   Radiology: Dg Chest Port 1 View  12/27/2013   CLINICAL DATA:  Hypoxia  EXAM: PORTABLE CHEST - 1 VIEW  COMPARISON:  December 26, 2013  FINDINGS: Endotracheal tube tip is 5.1 cm above the carina. Swan-Ganz catheter tip is in the main pulmonary outflow tract. There are 2 chest tubes on the left. Nasogastric tube tip is in the stomach with the side port at the gastroesophageal junction. No pneumothorax. There is significantly less effusion on the left compared to 1 day prior. There is extensive asymmetric edema throughout the left lung with interstitial and alveolar edema on the left. There is consolidation in the left base. Right lung is hyperexpanded but grossly clear. There appears to be some underlying emphysematous change. The heart size and pulmonary vascularity are normal. Patient is status post aortic valve replacement.  IMPRESSION: Tube and catheter positions as described without pneumothorax. Note that the nasogastric tube side port is at the gastroesophageal junction. It may be reasonable to consider advancing the nasogastric tube 5-6 cm to confirm that the side port of the nasogastric tube is well within the stomach.  Extensive asymmetric edema on the left with left base consolidation. Much less effusion is seen on the left compared to 1 day prior. Right lung mildly hyperexpanded but clear. Underlying emphysema.   Electronically Signed   By: Lowella Grip M.D.   On: 12/27/2013 08:01   Dg Chest Port 1 View  12/26/2013   CLINICAL DATA:  Status post CABG x2  EXAM: PORTABLE CHEST - 1 VIEW  COMPARISON:  12/26/2013  FINDINGS: Endotracheal tube terminates 5 cm above the carina.  Complete opacification left hemithorax. Two left apical chest tubes. Mediastinal drain.  Left IJ Swan-Ganz catheter  terminates in the main pulmonary artery.  Prosthetic aortic valve. Postsurgical changes related to prior CABG. Leftward cardiomediastinal shaft.  Right lung is clear.  No right pneumothorax.  IMPRESSION: Endotracheal tube terminates 5 cm above the carina.  Additional support apparatus as above.  Complete opacification of the left hemithorax, correlate for hemothorax.   Electronically Signed   By: Julian Hy M.D.   On: 12/26/2013 17:35   Dg Chest Port 1 View  12/26/2013   CLINICAL DATA:  Aortic valve replacement  EXAM: PORTABLE CHEST - 1 VIEW  COMPARISON:  12/22/2013  FINDINGS: There is an endotracheal tube with the tip 4.9 cm above the carina. There is a nasogastric tube coursing below the diaphragm. There is  a mediastinal drain present. There is a Swan-Ganz catheter with the tip projecting over the right ventricle.  The right lung is clear.  There is near complete opacification of the left lung. There is a left-sided chest tube present directed towards the apex.  Stable cardiomediastinal silhouette. Interval CABG, aortic valve replacement and left upper lung mass biopsy.  IMPRESSION: 1. Support lines and tubing as detailed above. 2. Near complete opacification of the left lung with a left-sided chest tube in satisfactory position. This likely represents a combination of pleural fluid and atelectatic lung.   Electronically Signed   By: Kathreen Devoid   On: 12/26/2013 15:55     Assessment/Plan: S/P Procedure(s) (LRB): ASCENDING AORTIC  REPLACEMENT (N/A) INTRAOPERATIVE TRANSESOPHAGEAL ECHOCARDIOGRAM (N/A) LUNG BIOPSY (Left) CORONARY ARTERY BYPASS GRAFTING (CABG) times one using the left internal mammary artery. (N/A) AORTIC VALVE REPLACEMENT (AVR) Continue foley due to acute urinary retention, strict I&O, patient critically ill, patient in ICU and urinary output monitoring will not wean vent until chest xray clears more Expected Acute  Blood - loss Anemia, with parenchymal   Hemophage left     Renal function stable from preop Thrombocytopaenia- avoid heparin currently    Jarman Litton B 12/27/2013 8:48 AM

## 2013-12-27 NOTE — Progress Notes (Signed)
CT surgery p.m. Rounds  Patient resting comfortably, mechanically ventilated with good saturation Chest tube output has been minimal, will reduce PEEP to 5 cm H2O Urine output adequate Cardiac index 1.8 on low-dose inotropes Plan on weaning sedation and ventilator in a.m.

## 2013-12-27 NOTE — Progress Notes (Signed)
MD notified that patient's CO/CI remain @ 2.9/1.6 respectively for the past few hours as well as a gradual dip in UO. Currently on 90 Neo, 5 Levo, and 8 Dopa. New orders to transfuse 1 unit PRBC's. Will continue to monitor. Richardean Sale, RN

## 2013-12-27 NOTE — Progress Notes (Signed)
Pt 0400 CI 1.41.  Called Dr. Roxan Hockey to advise.  Received orders for Albumin x1 with amp of Ca Chloride and order to titrate Dopamine to 8/mcg/kg/min to achieve CI .1.8.  Will initiate orders and continue to monitor.

## 2013-12-27 NOTE — Anesthesia Postprocedure Evaluation (Signed)
  Anesthesia Post-op Note  Patient: Alfred Hall  Procedure(s) Performed: Procedure(s) (LRB): ASCENDING AORTIC  REPLACEMENT (N/A) INTRAOPERATIVE TRANSESOPHAGEAL ECHOCARDIOGRAM (N/A) LUNG BIOPSY (Left) CORONARY ARTERY BYPASS GRAFTING (CABG) times one using the left internal mammary artery. (N/A) AORTIC VALVE REPLACEMENT (AVR)  Patient Location: ccu  Anesthesia Type: General  Level of Consciousness: sedated  Airway and Oxygen Therapy: Patient intubated  Post-op Pain: mild  Post-op Assessment: Post-op Vital signs reviewed, remains on pressors, BP quite labile during transport, oxygen saturation remaining 90-94% on 100% Fio2,  CXR ordered  Last Vitals:  Filed Vitals:   12/27/13 0900  BP:   Pulse:   Temp:   Resp: 19    Post-op Vital Signs: stable   Complications: No apparent anesthesia complications

## 2013-12-27 NOTE — Progress Notes (Signed)
Pt was noted to cough again with chest tube output of 250cc. Output was dark red and clotty. Will continue to monitor.

## 2013-12-27 NOTE — Progress Notes (Signed)
Patient CO/CI remain low following blood administration. Currently on 90 Neo, 8 Dopa, and 2 Levo. Patient has been 86-87 SR with PAC's. Will attempt to pace AAI @ 92 for increase in index. Will continue to monitor. Richardean Sale, RN

## 2013-12-27 NOTE — Progress Notes (Signed)
INITIAL NUTRITION ASSESSMENT  DOCUMENTATION CODES Per approved criteria  -Not Applicable   INTERVENTION:  If TF started, recommend Vital AF 1.2 formula -- initiate at 20 ml/hr and increase by 10 ml every 4 hours to goal rate of 70 ml/hr to provide 2016 kcals, 126 gm protein, 1362 ml of free water RD to follow for nutrition care plan  NUTRITION DIAGNOSIS: Inadequate oral intake related to inability to eat as evidenced by NPO status  Goal: Initiation of nutrition support in next 24-48 hours if prolonged intubation expected  Monitor:  TF initiation & tolerance, respiratory status, weight, labs, I/O's  Reason for Assessment: VDRF  80 y.o. male  Admitting Dx: severe aortic stenosis  ASSESSMENT: 80 y.o. Male with newly diagnosed Lung mass/aortic stenosis/ascending aortic aneurysm/abdominal aortic aneurysm; presented for surgical intervention.  Patient s/p procedures 11/10: ASCENDING AORTIC REPLACEMENT  LUNG BIOPSY (Left) CORONARY ARTERY BYPASS GRAFTING  AORTIC VALVE REPLACEMENT   Patient is currently intubated on ventilator support -- OGT in place MV: 13.4 L/min Temp (24hrs), Avg:98.7 F (37.1 C), Min:94.3 F (34.6 C), Max:100.6 F (38.1 C)   No muscle or subcutaneous fat depletion noticed.  Height: Ht Readings from Last 1 Encounters:  12/22/13 5' 8.5" (1.74 m)    Weight: Wt Readings from Last 1 Encounters:  12/27/13 171 lb 11.8 oz (77.9 kg)    Ideal Body Weight: 154 lb  % Ideal Body Weight: 111%  Wt Readings from Last 10 Encounters:  12/27/13 171 lb 11.8 oz (77.9 kg)  12/22/13 147 lb 12.8 oz (67.042 kg)  11/28/13 148 lb (67.132 kg)  11/23/13 148 lb (67.132 kg)  11/20/13 148 lb (67.132 kg)  11/14/13 144 lb 9.6 oz (65.59 kg)  10/11/13 142 lb (64.411 kg)  09/29/13 142 lb (64.411 kg)  09/21/13 142 lb 11.2 oz (64.728 kg)  09/21/13 142 lb 11.2 oz (64.728 kg)    Usual Body Weight: 147 lb  % Usual Body Weight: 116%  BMI:  Body mass index is 25.73  kg/(m^2).  Estimated Nutritional Needs: Kcal: 2000-2150 Protein: 120-130 gm Fluid: per MD  Skin: chest surgical incision   Diet Order: NPO  EDUCATION NEEDS: -No education needs identified at this time   Intake/Output Summary (Last 24 hours) at 12/27/13 1523 Last data filed at 12/27/13 1415  Gross per 24 hour  Intake 6931.72 ml  Output   6442 ml  Net 489.72 ml    Labs:   Recent Labs Lab 12/22/13 1420  12/26/13 1441 12/26/13 1528 12/26/13 2230 12/26/13 2233 12/27/13 0400  NA 139  < > 137 138  --  134* 138  K 4.0  < > 3.9 3.7  --  4.6 4.7  CL 98  < > 100  --   --  106 104  CO2 27  --   --   --   --   --  23  BUN 27*  < > 16  --   --  15 18  CREATININE 1.29  < > 0.90  --  0.99 1.10 1.06  CALCIUM 9.6  --   --   --   --   --  7.2*  MG  --   --   --   --  2.8*  --  2.7*  GLUCOSE 87  < > 104* 115*  --  132* 83  < > = values in this interval not displayed.  CBG (last 3)   Recent Labs  12/27/13 0341 12/27/13 0801 12/27/13 1150  GLUCAP 89  91 104*    Scheduled Meds: . acetaminophen  1,000 mg Oral 4 times per day   Or  . acetaminophen (TYLENOL) oral liquid 160 mg/5 mL  1,000 mg Per Tube 4 times per day  . antiseptic oral rinse  7 mL Mouth Rinse QID  . aspirin EC  325 mg Oral Daily   Or  . aspirin  324 mg Per Tube Daily  . atorvastatin  20 mg Oral q1800  . bisacodyl  10 mg Oral Daily   Or  . bisacodyl  10 mg Rectal Daily  . cefUROXime (ZINACEF)  IV  1.5 g Intravenous Q12H  . chlorhexidine  15 mL Mouth Rinse BID  . docusate sodium  200 mg Oral Daily  . insulin aspart  0-24 Units Subcutaneous 6 times per day  . insulin regular  0-10 Units Intravenous TID WC  . metoprolol tartrate  12.5 mg Oral BID   Or  . metoprolol tartrate  12.5 mg Per Tube BID  . multivitamin with minerals  1 tablet Oral Daily  . [START ON 12/28/2013] pantoprazole  40 mg Oral Daily  . sodium chloride  3 mL Intravenous Q12H    Continuous Infusions: . sodium chloride 20 mL/hr  (12/26/13 1515)  . sodium chloride 20 mL/hr (12/26/13 1515)  . sodium chloride    . dexmedetomidine 0.7 mcg/kg/hr (12/27/13 1012)  . DOPamine 8 mcg/kg/min (12/27/13 0700)  . insulin (NOVOLIN-R) infusion Stopped (12/27/13 0400)  . lactated ringers 20 mL/hr (12/26/13 1515)  . nitroGLYCERIN Stopped (12/26/13 1600)  . norepinephrine (LEVOPHED) Adult infusion Stopped (12/27/13 1400)  . phenylephrine (NEO-SYNEPHRINE) Adult infusion 95 mcg/min (12/27/13 1420)    Past Medical History  Diagnosis Date  . Bronchogenic cancer of left lung     a. followed by multidisciplinary thoracic oncology clinic  . PVD (peripheral vascular disease)   . Microhematuria     a. workup negative  . Aortic aneurysm, abdominal   . Hypercholesteremia   . Hypertension   . Thoracic aortic aneurysm without rupture   . Aortic stenosis     a. ECHO at [place]- pk AV grad 77 with a mean rate of 53   . Tobacco abuse   . CAD (coronary artery disease)     a. s/p LHC on 09/18/13 with non-obs disease and severe AS  . COPD (chronic obstructive pulmonary disease)   . Hypoglycemia   . Abdominal aortic aneurysm     Past Surgical History  Procedure Laterality Date  . Cataract extraction    . Eye surgery      CATARACT  . Throat nodule excision    . Nose surgery    . Cardiac catheterization    . Tonsillectomy    . Appendectomy    . Ascending aortic root replacement N/A 12/26/2013    Procedure: ASCENDING AORTIC  REPLACEMENT;  Surgeon: Grace Isaac, MD;  Location: [facility];  Service: Open Heart Surgery;  Laterality: N/A;  . Intraoperative transesophageal echocardiogram N/A 12/26/2013    Procedure: INTRAOPERATIVE TRANSESOPHAGEAL ECHOCARDIOGRAM;  Surgeon: Grace Isaac, MD;  Location: [facility];  Service: Open Heart Surgery;  Laterality: N/A;  . Lung biopsy Left 12/26/2013    Procedure: LUNG BIOPSY;  Surgeon: Grace Isaac, MD;  Location: [facility];  Service: Open Heart Surgery;  Laterality: Left;  . Coronary artery bypass graft  N/A 12/26/2013    Procedure: CORONARY ARTERY BYPASS GRAFTING (CABG) times one using the left internal mammary artery.;  Surgeon: Grace Isaac, MD;  Location:  [facility];  Service: Open Heart Surgery;  Laterality: N/A;  . Aortic valve replacement  12/26/2013    Procedure: AORTIC VALVE REPLACEMENT (AVR);  Surgeon: Grace Isaac, MD;  Location: [facility];  Service: Open Heart Surgery;;    Arthur Holms, RD, LDN Pager #: [phone] After-Hours Pager #: [phone]

## 2013-12-28 ENCOUNTER — Inpatient Hospital Stay (HOSPITAL_COMMUNITY): Payer: Medicare Other

## 2013-12-28 LAB — COMPREHENSIVE METABOLIC PANEL
ALT: 11 U/L (ref 0–53)
AST: 35 U/L (ref 0–37)
Albumin: 2.8 g/dL — ABNORMAL LOW (ref 3.5–5.2)
Alkaline Phosphatase: 31 U/L — ABNORMAL LOW (ref 39–117)
Anion gap: 12 (ref 5–15)
BUN: 19 mg/dL (ref 6–23)
CO2: 23 mEq/L (ref 19–32)
Calcium: 8.2 mg/dL — ABNORMAL LOW (ref 8.4–10.5)
Chloride: 100 mEq/L (ref 96–112)
Creatinine, Ser: 0.99 mg/dL (ref 0.50–1.35)
GFR calc Af Amer: 86 mL/min — ABNORMAL LOW (ref >90)
GFR calc non Af Amer: 74 mL/min — ABNORMAL LOW (ref >90)
Glucose, Bld: 132 mg/dL — ABNORMAL HIGH (ref 70–99)
Potassium: 4.4 mEq/L (ref 3.7–5.3)
Sodium: 135 mEq/L — ABNORMAL LOW (ref 137–147)
Total Bilirubin: 0.8 mg/dL (ref 0.3–1.2)
Total Protein: 4.9 g/dL — ABNORMAL LOW (ref 6.0–8.3)

## 2013-12-28 LAB — POCT I-STAT 3, ART BLOOD GAS (G3+)
Acid-Base Excess: 3 mmol/L — ABNORMAL HIGH (ref 0.0–2.0)
Acid-Base Excess: 5 mmol/L — ABNORMAL HIGH (ref 0.0–2.0)
Acid-base deficit: 1 mmol/L (ref 0.0–2.0)
Bicarbonate: 23.7 mEq/L (ref 20.0–24.0)
Bicarbonate: 26.5 mEq/L — ABNORMAL HIGH (ref 20.0–24.0)
Bicarbonate: 27.9 mEq/L — ABNORMAL HIGH (ref 20.0–24.0)
O2 Saturation: 93 %
O2 Saturation: 92 %
O2 Saturation: 94 %
Patient temperature: 37.3
Patient temperature: 37.2
Patient temperature: 37.3
TCO2: 25 mmol/L (ref 0–100)
TCO2: 28 mmol/L (ref 0–100)
TCO2: 29 mmol/L (ref 0–100)
pCO2 arterial: 37.5 mmHg (ref 35.0–45.0)
pCO2 arterial: 34 mmHg — ABNORMAL LOW (ref 35.0–45.0)
pCO2 arterial: 35.4 mmHg (ref 35.0–45.0)
pH, Arterial: 7.41 (ref 7.350–7.450)
pH, Arterial: 7.5 — ABNORMAL HIGH (ref 7.350–7.450)
pH, Arterial: 7.505 — ABNORMAL HIGH (ref 7.350–7.450)
pO2, Arterial: 67 mmHg — ABNORMAL LOW (ref 80.0–100.0)
pO2, Arterial: 57 mmHg — ABNORMAL LOW (ref 80.0–100.0)
pO2, Arterial: 65 mmHg — ABNORMAL LOW (ref 80.0–100.0)

## 2013-12-28 LAB — CBC
HCT: 26.2 % — ABNORMAL LOW (ref 39.0–52.0)
Hemoglobin: 9.4 g/dL — ABNORMAL LOW (ref 13.0–17.0)
MCH: 29.9 pg (ref 26.0–34.0)
MCHC: 35.9 g/dL (ref 30.0–36.0)
MCV: 83.4 fL (ref 78.0–100.0)
Platelets: 56 10*3/uL — ABNORMAL LOW (ref 150–400)
RBC: 3.14 MIL/uL — ABNORMAL LOW (ref 4.22–5.81)
RDW: 14.5 % (ref 11.5–15.5)
WBC: 12 10*3/uL — ABNORMAL HIGH (ref 4.0–10.5)

## 2013-12-28 LAB — GLUCOSE, CAPILLARY
Glucose-Capillary: 133 mg/dL — ABNORMAL HIGH (ref 70–99)
Glucose-Capillary: 126 mg/dL — ABNORMAL HIGH (ref 70–99)
Glucose-Capillary: 121 mg/dL — ABNORMAL HIGH (ref 70–99)
Glucose-Capillary: 106 mg/dL — ABNORMAL HIGH (ref 70–99)
Glucose-Capillary: 103 mg/dL — ABNORMAL HIGH (ref 70–99)

## 2013-12-28 LAB — PROTIME-INR
INR: 1.57 — ABNORMAL HIGH (ref 0.00–1.49)
Prothrombin Time: 19 seconds — ABNORMAL HIGH (ref 11.6–15.2)

## 2013-12-28 MED ORDER — SODIUM CHLORIDE 0.9 % IV SOLN: INTRAVENOUS | Status: DC | PRN

## 2013-12-28 MED ORDER — ALBUMIN HUMAN 5 % IV SOLN
12.5000 g | Freq: Once | INTRAVENOUS | Status: AC | PRN
  Administered 2013-12-28: 12.5 g via INTRAVENOUS
  Filled 2013-12-28: qty 250

## 2013-12-28 MED ORDER — FUROSEMIDE 10 MG/ML IJ SOLN
40.0000 mg | Freq: Once | INTRAMUSCULAR | Status: AC
  Administered 2013-12-28: 40 mg via INTRAVENOUS
  Filled 2013-12-28: qty 4

## 2013-12-28 NOTE — Op Note (Signed)
NAME:  PHILEMON, RIEDESEL NO.:  [id number]  MEDICAL RECORD NO.:  [id number]  LOCATION:  2S01C                        FACILITY:  [facility]  PHYSICIAN:  Lanelle Bal, MD    DATE OF BIRTH:  May 19, 1929  DATE OF PROCEDURE:  12/26/2013 DATE OF DISCHARGE:                              OPERATIVE REPORT   PROCEDURE PERFORMED:  Postop placement of left chest tube secondary to opacity left chest.  SURGEON:  Lanelle Bal, MD  BRIEF HISTORY:  The patient is an 80 year old male who  before placement of chest tube had undergone extensive cardiac surgery including replacement of ascending aorta, aortic valve, coronary artery bypass grafting, and biopsy of left lung mass.  On his initial postoperative chest x-ray, the left chest was opacified.  It was unclear if this was from retained clot in the chest versus parenchymal opacity. Because in the early postop period, the concern for bleeding.  A left chest was prepped with Hibiclens, infiltrated with 1% lidocaine and a second 28 chest tube was placed more posteriorly.  Approximately 100 mL of old dark blood was returned.  This suggested that the left chest was not full of blood, but that the opacity was secondary to a parenchymal hemorrhage.  The patient tolerated the procedure without complications.     Lanelle Bal, MD     EG/MEDQ  D:  12/27/2013  T:  12/27/2013  Job:  [id number]

## 2013-12-28 NOTE — Addendum Note (Signed)
Addendum  created 12/28/13 1112 by Myrtie Soman, MD   Modules edited: Anesthesia Attestations, Anesthesia Events, Narrator   Narrator:  Narrator: Event Log Edited

## 2013-12-28 NOTE — Progress Notes (Signed)
Patient ID: Alfred Hall, male   DOB: 1929/10/30, 80 y.o.   MRN: [id number] TCTS DAILY ICU PROGRESS NOTE                   [address]            [place] [address]          [phone]   2 Days Post-Op Procedure(s) (LRB): ASCENDING AORTIC  REPLACEMENT (N/A) INTRAOPERATIVE TRANSESOPHAGEAL ECHOCARDIOGRAM (N/A) LUNG BIOPSY (Left) CORONARY ARTERY BYPASS GRAFTING (CABG) times one using the left internal mammary artery. (N/A) AORTIC VALVE REPLACEMENT (AVR)  Total Length of Stay:  LOS: 2 days   Subjective: Remains on vent sedated but opens eyes and follows simple commands  Objective: Vital signs in last 24 hours: Temp:  [99 F (37.2 C)-100.8 F (38.2 C)] 99 F (37.2 C) (11/12 0745) Pulse Rate:  [82-102] 92 (11/12 0745) Cardiac Rhythm:  [-] Atrial paced (11/12 0745) Resp:  [0-25] 16 (11/12 0745) BP: (96-139)/(42-70) 107/53 mmHg (11/12 0730) SpO2:  [96 %-100 %] 100 % (11/12 0745) FiO2 (%):  [40 %-60 %] 40 % (11/12 0745) Weight:  [173 lb 1 oz (78.5 kg)] 173 lb 1 oz (78.5 kg) (11/12 0422)  Filed Weights   12/25/13 1239 12/27/13 0500 12/28/13 0422  Weight: 147 lb 12.8 oz (67.042 kg) 171 lb 11.8 oz (77.9 kg) 173 lb 1 oz (78.5 kg)    Weight change: 1 lb 5.2 oz (0.6 kg)   Hemodynamic parameters for last 24 hours: PAP: (21-32)/(10-21) 25/13 mmHg CO:  [2.7 L/min-3.6 L/min] 3.3 L/min CI:  [1.5 L/min/m2-2 L/min/m2] 1.8 L/min/m2  Intake/Output from previous day: 11/11 0701 - 11/12 0700 In: 3480.3 [I.V.:2465.3; Blood:335; NG/GT:330; IV Piggyback:350] Out: 2970 [Urine:1930; Emesis/NG output:250; Chest Tube:790]  Intake/Output this shift:    Current Meds: Scheduled Meds: . acetaminophen  1,000 mg Oral 4 times per day   Or  . acetaminophen (TYLENOL) oral liquid 160 mg/5 mL  1,000 mg Per Tube 4 times per day  . antiseptic oral rinse  7 mL Mouth Rinse QID  . aspirin EC  325 mg Oral Daily   Or  . aspirin  324 mg Per Tube Daily  . atorvastatin  20 mg Oral q1800   . bisacodyl  10 mg Oral Daily   Or  . bisacodyl  10 mg Rectal Daily  . cefUROXime (ZINACEF)  IV  1.5 g Intravenous Q12H  . chlorhexidine  15 mL Mouth Rinse BID  . docusate sodium  200 mg Oral Daily  . furosemide  40 mg Intravenous Once  . insulin aspart  0-24 Units Subcutaneous 6 times per day  . metoprolol tartrate  12.5 mg Oral BID   Or  . metoprolol tartrate  12.5 mg Per Tube BID  . multivitamin with minerals  1 tablet Oral Daily  . pantoprazole  40 mg Oral Daily  . sodium chloride  3 mL Intravenous Q12H   Continuous Infusions: . sodium chloride 20 mL/hr at 12/28/13 0700  . sodium chloride 20 mL/hr (12/26/13 1515)  . sodium chloride    . dexmedetomidine 0.4 mcg/kg/hr (12/28/13 0700)  . DOPamine 8 mcg/kg/min (12/28/13 0700)  . lactated ringers 20 mL/hr at 12/28/13 0700  . nitroGLYCERIN Stopped (12/26/13 1600)  . norepinephrine (LEVOPHED) Adult infusion Stopped (12/27/13 2145)  . phenylephrine (NEO-SYNEPHRINE) Adult infusion 35 mcg/min (12/28/13 0700)   PRN Meds:.Place/Maintain arterial line **AND** sodium chloride, metoprolol, midazolam, morphine injection, ondansetron (ZOFRAN) IV, oxyCODONE, sodium chloride, traMADol  General  appearance: cooperative and sedated Neurologic: intact Heart: regular rate and rhythm, S1, S2 normal, no murmur, click, rub or gallop Lungs: diminished breath sounds bilaterally Abdomen: soft, non-tender; bowel sounds normal; no masses,  no organomegaly Extremities: extremities normal, atraumatic, no cyanosis or edema and Homans sign is negative, no sign of DVT Wound: sternum intact  Lab Results: CBC: Recent Labs  12/27/13 1635 12/27/13 1637 12/28/13 0400  WBC 13.4*  --  12.0*  HGB 10.6* 9.5* 9.4*  HCT 30.2* 28.0* 26.2*  PLT 72*  --  56*   BMET:  Recent Labs  12/27/13 0400  12/27/13 1637 12/28/13 0400  NA 138  --  131* 135*  K 4.7  --  4.6 4.4  CL 104  --  103 100  CO2 23  --   --  23  GLUCOSE 83  --  141* 132*  BUN 18  --  18 19    CREATININE 1.06  < > 1.20 0.99  CALCIUM 7.2*  --   --  8.2*  < > = values in this interval not displayed.  PT/INR:  Recent Labs  12/28/13 0400  LABPROT 19.0*  INR 1.57*   Radiology: Dg Chest Port 1 View  12/28/2013   CLINICAL DATA:  Hypoxia  EXAM: PORTABLE CHEST - 1 VIEW  COMPARISON:  December 27, 2013  FINDINGS: Endotracheal tube tip is 5.1 cm above the carina. Swan-Ganz catheter tip is in the main pulmonary outflow tract directed slightly toward the right. There are 2 chest tubes on the left. Nasogastric tube tip and side port are below the diaphragm. No pneumothorax. There is persistent interstitial edema throughout the left lung with left effusion and left base consolidation. There is a smaller right effusion with new mild edema on the right. Heart is upper normal in size. There is probable underlying emphysematous change. Pulmonary vascularity is within normal limits.  IMPRESSION: There is new mild edema on the right with small right effusion. Persistent asymmetric edema on the left with left base consolidation and small left effusion. Tube and catheter positions are as described without apparent pneumothorax.   Electronically Signed   By: Lowella Grip M.D.   On: 12/28/2013 07:46   Dg Chest Port 1 View  12/27/2013   CLINICAL DATA:  Hypoxia  EXAM: PORTABLE CHEST - 1 VIEW  COMPARISON:  December 26, 2013  FINDINGS: Endotracheal tube tip is 5.1 cm above the carina. Swan-Ganz catheter tip is in the main pulmonary outflow tract. There are 2 chest tubes on the left. Nasogastric tube tip is in the stomach with the side port at the gastroesophageal junction. No pneumothorax. There is significantly less effusion on the left compared to 1 day prior. There is extensive asymmetric edema throughout the left lung with interstitial and alveolar edema on the left. There is consolidation in the left base. Right lung is hyperexpanded but grossly clear. There appears to be some underlying emphysematous  change. The heart size and pulmonary vascularity are normal. Patient is status post aortic valve replacement.  IMPRESSION: Tube and catheter positions as described without pneumothorax. Note that the nasogastric tube side port is at the gastroesophageal junction. It may be reasonable to consider advancing the nasogastric tube 5-6 cm to confirm that the side port of the nasogastric tube is well within the stomach.  Extensive asymmetric edema on the left with left base consolidation. Much less effusion is seen on the left compared to 1 day prior. Right lung mildly hyperexpanded but clear. Underlying emphysema.  Electronically Signed   By: Lowella Grip M.D.   On: 12/27/2013 08:01   Dg Chest Port 1 View  12/26/2013   CLINICAL DATA:  Status post CABG x2  EXAM: PORTABLE CHEST - 1 VIEW  COMPARISON:  12/26/2013  FINDINGS: Endotracheal tube terminates 5 cm above the carina.  Complete opacification left hemithorax. Two left apical chest tubes. Mediastinal drain.  Left IJ Swan-Ganz catheter terminates in the main pulmonary artery.  Prosthetic aortic valve. Postsurgical changes related to prior CABG. Leftward cardiomediastinal shaft.  Right lung is clear.  No right pneumothorax.  IMPRESSION: Endotracheal tube terminates 5 cm above the carina.  Additional support apparatus as above.  Complete opacification of the left hemithorax, correlate for hemothorax.   Electronically Signed   By: Julian Hy M.D.   On: 12/26/2013 17:35   Dg Chest Port 1 View  12/26/2013   CLINICAL DATA:  Aortic valve replacement  EXAM: PORTABLE CHEST - 1 VIEW  COMPARISON:  12/22/2013  FINDINGS: There is an endotracheal tube with the tip 4.9 cm above the carina. There is a nasogastric tube coursing below the diaphragm. There is a mediastinal drain present. There is a Swan-Ganz catheter with the tip projecting over the right ventricle.  The right lung is clear.  There is near complete opacification of the left lung. There is a left-sided  chest tube present directed towards the apex.  Stable cardiomediastinal silhouette. Interval CABG, aortic valve replacement and left upper lung mass biopsy.  IMPRESSION: 1. Support lines and tubing as detailed above. 2. Near complete opacification of the left lung with a left-sided chest tube in satisfactory position. This likely represents a combination of pleural fluid and atelectatic lung.   Electronically Signed   By: Kathreen Devoid   On: 12/26/2013 15:55     Assessment/Plan: S/P Procedure(s) (LRB): ASCENDING AORTIC  REPLACEMENT (N/A) INTRAOPERATIVE TRANSESOPHAGEAL ECHOCARDIOGRAM (N/A) LUNG BIOPSY (Left) CORONARY ARTERY BYPASS GRAFTING (CABG) times one using the left internal mammary artery. (N/A) AORTIC VALVE REPLACEMENT (AVR) Continued thrombocytopenia- avoid heparin Chest xray improved, fio2 demands decreased, start weaning vent Renal function remains stable Now off levaphed      Vince Ainsley B 12/28/2013 7:55 AM

## 2013-12-28 NOTE — Progress Notes (Signed)
POD # 2  No complaints at present  Having CT removed from left chest  BP 110/51 mmHg  Pulse 86  Temp(Src) 99 F (37.2 C) (Core (Comment))  Resp 19  Ht 5' 8.5" (1.74 m)  Wt 173 lb 1 oz (78.5 kg)  BMI 25.93 kg/m2  SpO2 99%   Intake/Output Summary (Last 24 hours) at 12/28/13 1745 Last data filed at 12/28/13 1700  Gross per 24 hour  Intake 2531.15 ml  Output   4270 ml  Net -1738.85 ml    Continue current care

## 2013-12-28 NOTE — Progress Notes (Signed)
Nif 20, VC 1.5L

## 2013-12-28 NOTE — Op Note (Signed)
NAMEMarland Kitchen  JODEY, BURBANO NO.:  [id number]  MEDICAL RECORD NO.:  [id number]  LOCATION:  2S01C                        FACILITY:  [facility]  PHYSICIAN:  Lanelle Bal, MD    DATE OF BIRTH:  September 17, 1929  DATE OF PROCEDURE:  12/26/2013 DATE OF DISCHARGE:                              OPERATIVE REPORT   PREOPERATIVE DIAGNOSES:  Ascending aortic aneurysm, severe critical aortic stenosis, coronary artery disease, left lung mass.  POSTOPERATIVE DIAGNOSES:  Ascending aortic aneurysm, severe critical aortic stenosis, coronary artery disease, left lung mass.  SURGICAL PROCEDURES:  Supracoronary replacement of ascending aorta with Hemashield graft 32 mm, replacement of aortic valve with pericardial tissue valve 25 mm, [employer], model 3300TFX, serial [id number]; coronary artery bypass grafting with the left internal mammary to the left anterior descending coronary artery, biopsy of left lung mass.  SURGEON:  Lanelle Bal, MD  FIRST ASSISTANT:  Lars Pinks, PA  BRIEF HISTORY:  The patient is an 80 year old male who presented to the office with the diagnosis of newly discovered left lung mass in August. Evaluation during this workup included a positive PET scan of the mass in the left upper lung.  The CT scan of the chest revealed a dilated ascending aorta of 5.3 cm.  In addition, the patient was found to have critical aortic stenosis with a velocity of greater than 5 cm/second and evidence of left ventricular hypertrophy.  He also had known abdominal aortic aneurysm and had been followed at [facility] and has now seen Dr. Franki Monte.  Pulmonary function studies were performed.  Because of the radiographic evidence and the patient's long history of smoking that he was at high risk for lung cancer, we proceeded with needle biopsy of the left lung mass.  This revealed fibrotic tissue.  After extensive discussion with the patient and his family, we decided to proceed  with replacement of his ascending aorta and relief of his critical aortic stenosis with a planned later to address his abdominal aneurysm.  Risks and options were discussed with the patient in detail especially with his known limited lung function and age.  The patient agreed and signed informed consent.  DESCRIPTION OF PROCEDURE:  With Swan-Ganz and arterial line monitors in place, the patient underwent general endotracheal anesthesia without incidents.  Skin of the chest and legs were prepped with Betadine and draped in usual sterile manner.  Appropriate time-out was performed. Median sternotomy was performed.  The left internal mammary artery was dissected down as pedicle graft.  The distal artery was divided, had good free flow.  The vessel was hydrostatically dilated with heparinized saline.  With the Rultract still in place on the left, we did palpate the left lung.  The mass in the left chest still remained highly suspicious of lung cancer.  Some minor adhesions were taken down.  The mass itself was too large to remove without a lobectomy.  We then opened the pericardium and as noted as this preop CT indicated, the patient had a dilated ascending aorta.  His aorta was elongated as we dissected superiorly.  There was enough room to place a cross clamp just proximal to the takeoff of the innominate artery and  cannulate in the arch.  The patient was systemically heparinized.  The ascending aorta just takeoff of the innominate was cannulated.  A retrograde cardioplegia catheter was placed.  A dual-venous right atrial catheter was placed.  The patient was then placed on cardiopulmonary bypass at 2.4 L/min/m2.  Right superior pulmonary vein vent was placed.  With the patient on bypass and the lung was deflated, the left lung was then elevated and we could feel the mass as noted.  At this point, because of the concern that this still could be lung cancer after palpating at the time of  surgery, we decided to re-biopsy the area as it was very closed to the surface of the lung.  A small portion of this mass was biopsied. The portion sent for culture and AFB smear and part for frozen section. The frozen section returned back similar to the previous biopsy of fibrotic inflammatory tissue with no evidence of malignancy.  We then proceeded with the aortic valve and ascending aorta.  The patient's body temperature was cooled to 30 degrees.  An aortic cross-clamp was placed just at the proximal to the takeoff of the innominate artery.  Cold blood cardioplegia was administered through a cannula in the ascending aorta an 800 mL.  Additional retrograde cardioplegia was administered intermittently during the procedure.  A topical cold was used. Myocardial septal temperature was monitored.  We then opened the aorta and divided at the sino-tubular ridge.  This gave good exposure of a very highly calcified previously bicuspid valve.  We then excised the valve leaflets and removed all calcific debris.  The aortic root was slightly enlarged, but considering the patient's age of 10 years, we decided to place the grafts supracoronary with a 33-mm graft.  The aortic to sino-tubular ridge was approximately 3 cm.  With the valve excised, the anulus sized for a 25 mm pericardial tissue valve, [employer], model 3300TFX, serial [id number].  #2 Ti-Cron pledgeted sutures were placed circumferentially around the annulus and the aortic valve was secured in place.  The valve seated well.  We then sewed the proximal anastomosis of the graft with a running 4-0 Prolene suture, however felt strip.  CoSeal was placed on the suture and felt strip. The aortic root was then pressurized with cold blood cardioplegia, both to administer cardioplegia and tested for leaks.  We then proceeded with the proximal anastomosis in a similar fashion, the graft was trimmed.  A 32-mm graft approximated the size of  the distal aorta appropriately.  A running 4-0 Prolene with felt strip was used for the distal anastomosis. An aortic root vent cardioplegia needle was introduced into the graft to help the air.  We then proceeded with coronary artery bypass taking using the previously harvested pedicle graft and left internal mammary anastomosed to the midportion of the left anterior descending coronary artery with a running 8-0 Prolene.  With release of the bulldog on the mammary artery was rise in myocardial septal temperature.  The heart was allowed to passively fill and deaired.  The distal aortic anastomosis was then completed and further de-airing through the aortic root vent. The aortic cross-clamp was removed with a total cross-clamp time of 138 minutes.  As the patient rewarmed, it converted spontaneously to a sinus rhythm.  TEE showed good LV function and intact aortic valve prosthesis. The right superior pulmonary vein vent was removed.  The retrograde cardioplegia catheter was removed.  In the left chest, there was bleeding from the biopsy site.  A pledgetted suture was placed in the lung around the area that was biopsied.  The patient was then ventilated and weaned from cardiopulmonary bypass with a total pump time of 198 minutes.  Initially, his blood pressure was somewhat labile, requiring increased volume protamine.  The patient also dilated significantly with protamine administration.  He was decannulated in usual fashion and protamine sulfate was administered along with volume because of low hematocrit while on bypass and low volume.  After bypass, he did require a blood transfusion.  Atrial and ventricular pacing wires were applied. A left chest tube and a Blake mediastinal drain were left in place.  The pericardium was loosely reapproximated.  The sternum was then closed with #6 stainless steel wire, fascia was closed with interrupted 0 Vicryl, running 0 Vicryl, subcutaneous tissue  with 4-0, subcuticular stitch in skin edges.  Dry dressings were applied. Sponge and needle count was reported as correct at the completion of procedure.  The patient tolerated procedure and was transferred to the Surgical Intensive Care Unit for further postoperative care.     Lanelle Bal, MD     EG/MEDQ  D:  12/27/2013  T:  12/28/2013  Job:  [id number]

## 2013-12-28 NOTE — Procedures (Signed)
Extubation Procedure Note  Patient Details:   Name: Alfred Hall DOB: 01/15/1930 MRN: [id number]   Airway Documentation:  Airway 8 mm (Active)  Secured at (cm) 23 cm 12/28/2013  7:32 AM  Measured From Lips 12/28/2013  7:32 AM  Secured Location Right 12/28/2013  7:32 AM  Secured By [employer] 12/28/2013  7:32 AM  Tube Holder Repositioned Yes 12/28/2013  7:32 AM  Cuff Pressure (cm H2O) 30 cm H2O 12/27/2013  8:16 PM  Site Condition Dry 12/28/2013  7:32 AM   Incentive spirometer x10 breaths achieved 750cc's followed by strong congested nonproductive cough, placed on 4lpm [place]. Evaluation  O2 sats: stable throughout Complications: No apparent complications Patient did tolerate procedure well. Bilateral Breath Sounds: Clear, Diminished Suctioning: Airway Yes  [name] 12/28/2013, 10:12 AM

## 2013-12-29 ENCOUNTER — Inpatient Hospital Stay (HOSPITAL_COMMUNITY): Payer: Medicare Other

## 2013-12-29 LAB — GLUCOSE, CAPILLARY
Glucose-Capillary: 108 mg/dL — ABNORMAL HIGH (ref 70–99)
Glucose-Capillary: 108 mg/dL — ABNORMAL HIGH (ref 70–99)
Glucose-Capillary: 129 mg/dL — ABNORMAL HIGH (ref 70–99)
Glucose-Capillary: 81 mg/dL (ref 70–99)
Glucose-Capillary: 87 mg/dL (ref 70–99)
Glucose-Capillary: 97 mg/dL (ref 70–99)

## 2013-12-29 LAB — CBC
HCT: 24.1 % — ABNORMAL LOW (ref 39.0–52.0)
Hemoglobin: 8.5 g/dL — ABNORMAL LOW (ref 13.0–17.0)
MCH: 30.1 pg (ref 26.0–34.0)
MCHC: 35.3 g/dL (ref 30.0–36.0)
MCV: 85.5 fL (ref 78.0–100.0)
Platelets: 50 10*3/uL — ABNORMAL LOW (ref 150–400)
RBC: 2.82 MIL/uL — ABNORMAL LOW (ref 4.22–5.81)
RDW: 14.5 % (ref 11.5–15.5)
WBC: 13.2 10*3/uL — ABNORMAL HIGH (ref 4.0–10.5)

## 2013-12-29 LAB — BASIC METABOLIC PANEL
Anion gap: 11 (ref 5–15)
BUN: 24 mg/dL — ABNORMAL HIGH (ref 6–23)
CO2: 26 mEq/L (ref 19–32)
Calcium: 8 mg/dL — ABNORMAL LOW (ref 8.4–10.5)
Chloride: 98 mEq/L (ref 96–112)
Creatinine, Ser: 0.47 mg/dL — ABNORMAL LOW (ref 0.50–1.35)
GFR calc Af Amer: >90 mL/min (ref >90)
GFR calc non Af Amer: >90 mL/min (ref >90)
Glucose, Bld: 155 mg/dL — ABNORMAL HIGH (ref 70–99)
Potassium: 3.8 mEq/L (ref 3.7–5.3)
Sodium: 135 mEq/L — ABNORMAL LOW (ref 137–147)

## 2013-12-29 LAB — PROTIME-INR
INR: 1.36 (ref 0.00–1.49)
Prothrombin Time: 16.9 seconds — ABNORMAL HIGH (ref 11.6–15.2)

## 2013-12-29 MED ORDER — SODIUM CHLORIDE 0.9 % IV SOLN: INTRAVENOUS | Status: DC | PRN

## 2013-12-29 MED ORDER — AMIODARONE HCL 200 MG PO TABS
200.0000 mg | ORAL_TABLET | Freq: Two times a day (BID) | ORAL | Status: AC
  Administered 2013-12-29 – 2014-01-05 (×14): 200 mg via ORAL
  Filled 2013-12-29 (×14): qty 1

## 2013-12-29 MED ORDER — CETYLPYRIDINIUM CHLORIDE 0.05 % MT LIQD
7.0000 mL | Freq: Two times a day (BID) | OROMUCOSAL | Status: DC
  Administered 2013-12-29 – 2014-01-09 (×18): 7 mL via OROMUCOSAL

## 2013-12-29 MED ORDER — FUROSEMIDE 10 MG/ML IJ SOLN
40.0000 mg | Freq: Once | INTRAMUSCULAR | Status: AC
  Administered 2013-12-29: 40 mg via INTRAVENOUS
  Filled 2013-12-29: qty 4

## 2013-12-29 MED ORDER — AMIODARONE HCL 200 MG PO TABS
200.0000 mg | ORAL_TABLET | Freq: Once | ORAL | Status: AC
  Administered 2013-12-29: 200 mg via ORAL
  Filled 2013-12-29: qty 1

## 2013-12-29 MED ORDER — ASPIRIN 81 MG PO CHEW
81.0000 mg | CHEWABLE_TABLET | Freq: Every day | ORAL | Status: DC
  Administered 2013-12-30: 81 mg
  Filled 2013-12-29: qty 1

## 2013-12-29 MED ORDER — ASPIRIN EC 81 MG PO TBEC
81.0000 mg | DELAYED_RELEASE_TABLET | Freq: Every day | ORAL | Status: DC
  Administered 2013-12-29 – 2014-01-01 (×3): 81 mg via ORAL
  Filled 2013-12-29 (×4): qty 1

## 2013-12-29 MED ORDER — TRAMADOL HCL 50 MG PO TABS: 50.0000 mg | ORAL_TABLET | ORAL | Status: DC | PRN

## 2013-12-29 MED ORDER — OXYCODONE HCL 5 MG PO TABS
5.0000 mg | ORAL_TABLET | ORAL | Status: DC | PRN
  Administered 2013-12-29 – 2014-01-01 (×14): 5 mg via ORAL
  Filled 2013-12-29 (×15): qty 1

## 2013-12-29 MED ORDER — AMIODARONE HCL 200 MG PO TABS
200.0000 mg | ORAL_TABLET | Freq: Every day | ORAL | Status: DC
  Administered 2014-01-06 – 2014-01-09 (×4): 200 mg via ORAL
  Filled 2013-12-29 (×4): qty 1

## 2013-12-29 NOTE — Progress Notes (Signed)
TCTS BRIEF SICU PROGRESS NOTE  3 Days Post-Op  S/P Procedure(s) (LRB): ASCENDING AORTIC  REPLACEMENT (N/A) INTRAOPERATIVE TRANSESOPHAGEAL ECHOCARDIOGRAM (N/A) LUNG BIOPSY (Left) CORONARY ARTERY BYPASS GRAFTING (CABG) times one using the left internal mammary artery. (N/A) AORTIC VALVE REPLACEMENT (AVR)   Stable day NSR w/ frequent PAC's BP stable UOP adequate  Plan: Continue current plan  Nishka Heide H 12/29/2013 8:15 PM

## 2013-12-29 NOTE — Plan of Care (Signed)
Problem: Phase II - Intermediate Post-Op Goal: Wean to Extubate Outcome: Completed/Met Date Met:  12/29/13 Goal: Maintain Hemodynamic Stability Outcome: Progressing Goal: CBGs/Blood Glucose per SCIP Criteria Outcome: Completed/Met Date Met:  12/29/13 Goal: Pain controlled with appropriate interventions Outcome: Progressing Goal: Activity Progressed Outcome: Progressing

## 2013-12-29 NOTE — Progress Notes (Signed)
   Amiodarone Drug - Drug Interaction Consult Note  Amiodarone is metabolized by the cytochrome P450 system and therefore has the potential to cause many drug interactions. Amiodarone has an average plasma half-life of 50 days (range 20 to 100 days).   There is potential for drug interactions to occur several weeks or months after stopping treatment and the onset of drug interactions may be slow after initiating amiodarone.   [x]  Statins: Increased risk of myopathy. Atorvastatin - monitor for muscle pain or weakness  []  Anticoagulants: Amiodarone can increase anticoagulant effect. Consider warfarin dose reduction. Patients should be monitored closely and the dose of anticoagulant altered accordingly, remembering that amiodarone levels take several weeks to stabilize.  []  Antiepileptics: Amiodarone can increase plasma concentration of phenytoin, the dose should be reduced. Note that small changes in phenytoin dose can result in large changes in levels. Monitor patient and counsel on signs of toxicity.  [x]  Beta blockers: increased risk of bradycardia, AV block and myocardial depression. Lopressor  []   Calcium channel blockers (diltiazem and verapamil): increased risk of bradycardia, AV block and myocardial depression.  []   Cyclosporine: Amiodarone increases levels of cyclosporine. Reduced dose of cyclosporine is recommended.  []  Digoxin dose should be halved when amiodarone is started.  [x]  Diuretics: increased risk of cardiotoxicity if hypokalemia occurs. Lasix  []  Oral hypoglycemic agents (glyburide, glipizide, glimepiride): increased risk of hypoglycemia. Patient's glucose levels should be monitored closely when initiating amiodarone therapy.   []  Drugs that prolong the QT interval:  Torsades de pointes risk may be increased with concurrent use - avoid if possible.  Monitor QTc, also keep magnesium/potassium WNL if concurrent therapy can't be avoided. Marland Kitchen Antibiotics: e.g. fluoroquinolones,  erythromycin. . Antiarrhythmics: e.g. quinidine, procainamide, disopyramide, sotalol. . Antipsychotics: e.g. phenothiazines, haloperidol.  . Lithium, tricyclic antidepressants, and methadone.  Thank You,  Earleen Newport  12/29/2013 8:05 AM

## 2013-12-29 NOTE — Progress Notes (Signed)
Patient ID: Alfred Hall, male   DOB: 28-Feb-1929, 80 y.o.   MRN: [id number] TCTS DAILY ICU PROGRESS NOTE                   [address]            [place] [address]          [phone]   3 Days Post-Op Procedure(s) (LRB): ASCENDING AORTIC  REPLACEMENT (N/A) INTRAOPERATIVE TRANSESOPHAGEAL ECHOCARDIOGRAM (N/A) LUNG BIOPSY (Left) CORONARY ARTERY BYPASS GRAFTING (CABG) times one using the left internal mammary artery. (N/A) AORTIC VALVE REPLACEMENT (AVR)  Total Length of Stay:  LOS: 3 days   Subjective: Extubated for 24 hours, neuro intact and cooperative   Objective: Vital signs in last 24 hours: Temp:  [97.7 F (36.5 C)-99.1 F (37.3 C)] 97.7 F (36.5 C) (11/13 0700) Pulse Rate:  [27-92] 37 (11/13 0700) Cardiac Rhythm:  [-] Normal sinus rhythm (11/13 0700) Resp:  [13-29] 21 (11/13 0700) BP: (85-139)/(31-84) 113/40 mmHg (11/13 0700) SpO2:  [91 %-100 %] 94 % (11/13 0700) FiO2 (%):  [40 %] 40 % (11/12 1000) Weight:  [166 lb 7.2 oz (75.5 kg)] 166 lb 7.2 oz (75.5 kg) (11/13 0500)  Filed Weights   12/27/13 0500 12/28/13 0422 12/29/13 0500  Weight: 171 lb 11.8 oz (77.9 kg) 173 lb 1 oz (78.5 kg) 166 lb 7.2 oz (75.5 kg)    Weight change: -6 lb 9.8 oz (-3 kg)   Hemodynamic parameters for last 24 hours: PAP: (21-42)/(4-19) 29/14 mmHg CO:  [3 L/min-4.6 L/min] 3.1 L/min CI:  [1.7 L/min/m2-2.5 L/min/m2] 1.7 L/min/m2  Intake/Output from previous day: 11/12 0701 - 11/13 0700 In: 1574.8 [I.V.:1494.8; NG/GT:30; IV Piggyback:50] Out: 2130 [Urine:2845; Chest Tube:430]  Intake/Output this shift:    Current Meds: Scheduled Meds: . acetaminophen  1,000 mg Oral 4 times per day   Or  . acetaminophen (TYLENOL) oral liquid 160 mg/5 mL  1,000 mg Per Tube 4 times per day  . antiseptic oral rinse  7 mL Mouth Rinse BID  . aspirin EC  325 mg Oral Daily   Or  . aspirin  324 mg Per Tube Daily  . atorvastatin  20 mg Oral q1800  . bisacodyl  10 mg Oral Daily   Or  .  bisacodyl  10 mg Rectal Daily  . docusate sodium  200 mg Oral Daily  . insulin aspart  0-24 Units Subcutaneous 6 times per day  . metoprolol tartrate  12.5 mg Oral BID   Or  . metoprolol tartrate  12.5 mg Per Tube BID  . multivitamin with minerals  1 tablet Oral Daily  . pantoprazole  40 mg Oral Daily  . sodium chloride  3 mL Intravenous Q12H   Continuous Infusions: . sodium chloride 20 mL/hr at 12/29/13 0700  . sodium chloride 20 mL/hr (12/26/13 1515)  . sodium chloride    . dexmedetomidine Stopped (12/28/13 0800)  . DOPamine 3 mcg/kg/min (12/29/13 0700)  . lactated ringers 20 mL/hr at 12/29/13 0700  . nitroGLYCERIN Stopped (12/26/13 1600)  . norepinephrine (LEVOPHED) Adult infusion Stopped (12/27/13 2145)  . phenylephrine (NEO-SYNEPHRINE) Adult infusion Stopped (12/29/13 0550)   PRN Meds:.Place/Maintain arterial line **AND** sodium chloride, Place/Maintain arterial line **AND** sodium chloride, metoprolol, midazolam, morphine injection, ondansetron (ZOFRAN) IV, oxyCODONE, sodium chloride, traMADol  General appearance: alert, cooperative, appears stated age and no distress Neurologic: intact Heart: no murmur, frequent pacs Lungs: diminished breath sounds bilaterally Abdomen: soft, non-tender; bowel sounds normal; no masses,  no organomegaly Extremities: extremities normal, atraumatic, no cyanosis or edema and Homans sign is negative, no sign of DVT Wound: sternum stable dressing intact  Lab Results: CBC: Recent Labs  12/28/13 0400 12/29/13 0400  WBC 12.0* 13.2*  HGB 9.4* 8.5*  HCT 26.2* 24.1*  PLT 56* 50*   BMET:  Recent Labs  12/28/13 0400 12/29/13 0400  NA 135* 135*  K 4.4 3.8  CL 100 98  CO2 23 26  GLUCOSE 132* 155*  BUN 19 24*  CREATININE 0.99 0.47*  CALCIUM 8.2* 8.0*    PT/INR:  Recent Labs  12/29/13 0400  LABPROT 16.9*  INR 1.36   Radiology:  Dg Chest Port 1 View  12/28/2013   CLINICAL DATA:  Hypoxia  EXAM: PORTABLE CHEST - 1 VIEW  COMPARISON:   December 27, 2013  FINDINGS: Endotracheal tube tip is 5.1 cm above the carina. Swan-Ganz catheter tip is in the main pulmonary outflow tract directed slightly toward the right. There are 2 chest tubes on the left. Nasogastric tube tip and side port are below the diaphragm. No pneumothorax. There is persistent interstitial edema throughout the left lung with left effusion and left base consolidation. There is a smaller right effusion with new mild edema on the right. Heart is upper normal in size. There is probable underlying emphysematous change. Pulmonary vascularity is within normal limits.  IMPRESSION: There is new mild edema on the right with small right effusion. Persistent asymmetric edema on the left with left base consolidation and small left effusion. Tube and catheter positions are as described without apparent pneumothorax.   Electronically Signed   By: Lowella Grip M.D.   On: 12/28/2013 07:46   Path noted, lung bx consistent with needle bx- no maligancy  Assessment/Plan: S/P Procedure(s) (LRB): ASCENDING AORTIC  REPLACEMENT (N/A) INTRAOPERATIVE TRANSESOPHAGEAL ECHOCARDIOGRAM (N/A) LUNG BIOPSY (Left) CORONARY ARTERY BYPASS GRAFTING (CABG) times one using the left internal mammary artery. (N/A) AORTIC VALVE REPLACEMENT (AVR) No off all drips except low dose dopamine Still with thrombocytopenia- avoiding heparin still on asa  Frequent pac- will start Cordarone at least short term  Will need pt to help mobilize Leave foley one more day while diuresing and monitoring uop  Renal function stable Leave remaining chest tube one more day      Parag Dorton B 12/29/2013 7:39 AM

## 2013-12-29 NOTE — Progress Notes (Signed)
PT Cancellation Note  Patient Details Name: Alfred Hall MRN: [id number] DOB: [birth year]   Cancelled Treatment:    Reason Eval/Treat Not Completed: Other (comment) (pt just up to chair with nursing and fatigued will attempt next date)   [name] 12/29/2013, 12:00 PM [name], [place]

## 2013-12-30 ENCOUNTER — Inpatient Hospital Stay (HOSPITAL_COMMUNITY): Payer: Medicare Other

## 2013-12-30 LAB — TYPE AND SCREEN
ABO/RH(D): A POS
Antibody Screen: NEGATIVE
Unit division: 0
Unit division: 0
Unit division: 0
Unit division: 0
Unit division: 0
Unit division: 0
Unit division: 0
Unit division: 0

## 2013-12-30 LAB — CBC
HCT: 23.5 % — ABNORMAL LOW (ref 39.0–52.0)
Hemoglobin: 8.1 g/dL — ABNORMAL LOW (ref 13.0–17.0)
MCH: 30.7 pg (ref 26.0–34.0)
MCHC: 34.5 g/dL (ref 30.0–36.0)
MCV: 89 fL (ref 78.0–100.0)
Platelets: 74 10*3/uL — ABNORMAL LOW (ref 150–400)
RBC: 2.64 MIL/uL — ABNORMAL LOW (ref 4.22–5.81)
RDW: 14.6 % (ref 11.5–15.5)
WBC: 8.6 10*3/uL (ref 4.0–10.5)

## 2013-12-30 LAB — GLUCOSE, CAPILLARY
Glucose-Capillary: 99 mg/dL (ref 70–99)
Glucose-Capillary: 106 mg/dL — ABNORMAL HIGH (ref 70–99)
Glucose-Capillary: 108 mg/dL — ABNORMAL HIGH (ref 70–99)

## 2013-12-30 LAB — TISSUE CULTURE: Culture: NO GROWTH

## 2013-12-30 LAB — BASIC METABOLIC PANEL
Anion gap: 13 (ref 5–15)
BUN: 33 mg/dL — ABNORMAL HIGH (ref 6–23)
CO2: 27 mEq/L (ref 19–32)
Calcium: 8.1 mg/dL — ABNORMAL LOW (ref 8.4–10.5)
Chloride: 98 mEq/L (ref 96–112)
Creatinine, Ser: 1.22 mg/dL (ref 0.50–1.35)
GFR calc Af Amer: 62 mL/min — ABNORMAL LOW (ref >90)
GFR calc non Af Amer: 53 mL/min — ABNORMAL LOW (ref >90)
Glucose, Bld: 102 mg/dL — ABNORMAL HIGH (ref 70–99)
Potassium: 3.9 mEq/L (ref 3.7–5.3)
Sodium: 138 mEq/L (ref 137–147)

## 2013-12-30 MED ORDER — FUROSEMIDE 40 MG PO TABS
40.0000 mg | ORAL_TABLET | Freq: Every day | ORAL | Status: DC
  Administered 2013-12-30 – 2014-01-09 (×10): 40 mg via ORAL
  Filled 2013-12-30 (×11): qty 1

## 2013-12-30 MED ORDER — SODIUM CHLORIDE 0.9 % IV SOLN
INTRAVENOUS | Status: DC
  Administered 2013-12-30: 15:00:00 via INTRAVENOUS
  Administered 2013-12-31: 10 mL/h via INTRAVENOUS

## 2013-12-30 MED ORDER — ENSURE COMPLETE PO LIQD
237.0000 mL | Freq: Two times a day (BID) | ORAL | Status: DC
  Administered 2013-12-30 – 2013-12-31 (×3): 237 mL via ORAL

## 2013-12-30 MED ORDER — PHENOL 1.4 % MT LIQD: 1.0000 | OROMUCOSAL | Status: DC | PRN

## 2013-12-30 MED ORDER — POTASSIUM CHLORIDE CRYS ER 20 MEQ PO TBCR
20.0000 meq | EXTENDED_RELEASE_TABLET | Freq: Every day | ORAL | Status: DC
  Administered 2013-12-31 – 2014-01-09 (×10): 20 meq via ORAL
  Filled 2013-12-30 (×12): qty 1

## 2013-12-30 MED ORDER — MORPHINE SULFATE 2 MG/ML IJ SOLN: 1.0000 mg | INTRAMUSCULAR | Status: DC | PRN

## 2013-12-30 NOTE — Progress Notes (Signed)
TCTS BRIEF SICU PROGRESS NOTE  4 Days Post-Op  S/P Procedure(s) (LRB): ASCENDING AORTIC  REPLACEMENT (N/A) INTRAOPERATIVE TRANSESOPHAGEAL ECHOCARDIOGRAM (N/A) LUNG BIOPSY (Left) CORONARY ARTERY BYPASS GRAFTING (CABG) times one using the left internal mammary artery. (N/A) AORTIC VALVE REPLACEMENT (AVR)   Stable day  Plan: Continue current plan  OWEN,CLARENCE H 12/30/2013 7:57 PM

## 2013-12-30 NOTE — Progress Notes (Signed)
      [address]       [place] [address]             [phone]        CARDIOTHORACIC SURGERY PROGRESS NOTE   R4 Days Post-Op Procedure(s) (LRB): ASCENDING AORTIC  REPLACEMENT (N/A) INTRAOPERATIVE TRANSESOPHAGEAL ECHOCARDIOGRAM (N/A) LUNG BIOPSY (Left) CORONARY ARTERY BYPASS GRAFTING (CABG) times one using the left internal mammary artery. (N/A) AORTIC VALVE REPLACEMENT (AVR)  Subjective: Feels weak.  No appetite.  Mild soreness in chest  Objective: Vital signs: BP Readings from Last 1 Encounters:  12/30/13 138/51   Pulse Readings from Last 1 Encounters:  12/30/13 79   Resp Readings from Last 1 Encounters:  12/30/13 16   Temp Readings from Last 1 Encounters:  12/30/13 98.3 F (36.8 C) Oral    Hemodynamics:    Physical Exam:  Rhythm:   sinus  Breath sounds: Coarse rhonchi both sides  Heart sounds:  RRR  Incisions:  Clean and dry  Abdomen:  Soft, non-distended, non-tender  Extremities:  Warm, well-perfused    Intake/Output from previous day: 11/13 0701 - 11/14 0700 In: 550 [I.V.:550] Out: 1560 [Urine:1230; Chest Tube:330] Intake/Output this shift: Total I/O In: 20 [I.V.:20] Out: 25 [Urine:25]  Lab Results:  CBC: Recent Labs  12/29/13 0400 12/30/13 0428  WBC 13.2* 8.6  HGB 8.5* 8.1*  HCT 24.1* 23.5*  PLT 50* 74*    BMET:  Recent Labs  12/29/13 0400 12/30/13 0428  NA 135* 138  K 3.8 3.9  CL 98 98  CO2 26 27  GLUCOSE 155* 102*  BUN 24* 33*  CREATININE 0.47* 1.22  CALCIUM 8.0* 8.1*     CBG (last 3)   Recent Labs  12/29/13 2342 12/30/13 0352 12/30/13 0956  GLUCAP 106* 99 108*    ABG    Component Value Date/Time   PHART 7.505* 12/28/2013 1239   PCO2ART 35.4 12/28/2013 1239   PO2ART 65.0* 12/28/2013 1239   HCO3 27.9* 12/28/2013 1239   TCO2 29 12/28/2013 1239   ACIDBASEDEF 1.0 12/28/2013 0417   O2SAT 94.0 12/28/2013 1239    CXR: PORTABLE CHEST - 1 VIEW  COMPARISON: 12/29/2013  FINDINGS: The  Swan-Ganz catheter has been removed. A right jugular sheath remains in place. The left thoracostomy catheter also remains in place. Postsurgical changes are again noted. The cardiac shadow is stable. Persistent interstitial changes are noted bilaterally but much worse on the left than the right. The previously seen effusions have improved in the interval. Known left upper lobe mass lesion is less well visualized on the current exam.  IMPRESSION: Persistent interstitial changes particularly in the left lung. Continued followup is recommended.   Electronically Signed  By: Inez Catalina M.D.  On: 12/30/2013 08:11  Assessment/Plan: S/P Procedure(s) (LRB): ASCENDING AORTIC  REPLACEMENT (N/A) INTRAOPERATIVE TRANSESOPHAGEAL ECHOCARDIOGRAM (N/A) LUNG BIOPSY (Left) CORONARY ARTERY BYPASS GRAFTING (CABG) times one using the left internal mammary artery. (N/A) AORTIC VALVE REPLACEMENT (AVR)  Overall stable POD4 Maintaining NSR w/ stable BP Marginal resp status, chest tube still draining Expected post op acute blood loss anemia, stable Expected post op atelectasis, stable Expected post op volume excess, mild, diuresing some Post op thrombocytopenia, improved   Mobilize  D/C foley  Leave chest tube in for now  Pulm toilet  Rodric Punch H 12/30/2013 10:23 AM

## 2013-12-30 NOTE — Evaluation (Signed)
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 Pt educated for sternal precautions with all basic transfers and mobility as well as limitations with activity, use of RW and progression. Pt will benefit from acute therapy to maximize mobility, function, strength, gait and knowledge of precautions. Pt encouraged to continue mobility with nursing as able.     Follow Up Recommendations SNF;Supervision for mobility/OOB    Equipment Recommendations  Rolling walker with 5" wheels    Recommendations for Other Services       Precautions / Restrictions Precautions Precautions: Sternal;Fall      Mobility  Bed Mobility Overal bed mobility: Needs Assistance Bed Mobility: Sit to Sidelying         Sit to sidelying: Min assist General bed mobility comments: cues for sequence with assist to bring legs onto surface and position in midline in bed  Transfers Overall transfer level: Needs assistance   Transfers: Sit to/from Stand Sit to Stand: Mod assist         General transfer comment: cues for hand placement, anterior translation, sequence and safety with assist for reciprocal scooting and powering up  Ambulation/Gait Ambulation/Gait assistance: Min assist Ambulation Distance (Feet): 60 Feet Assistive device: Rolling walker (2 wheeled) Gait Pattern/deviations: Shuffle;Wide base of support;Trunk flexed;Decreased stride length   Gait velocity interpretation: Below normal speed for age/gender General Gait Details: pt shuffling gait with cues for posture, increased stride and stepping into RW limited by fatigue  Stairs            Wheelchair Mobility    Modified Rankin  (Stroke Patients Only)       Balance Overall balance assessment: Needs assistance   Sitting balance-Leahy Scale: Fair       Standing balance-Leahy Scale: Poor                               Pertinent Vitals/Pain Pain Assessment: 0-10 Pain Score: 2  Pain Location: sternal Pain Descriptors / Indicators: Aching Pain Intervention(s): Repositioned  HR 78-86 sats down to 88% with transfers on 4L on 6L maintaining 92% with activity 134/43 sitting in chair (66) 124/43 (65) in standing pt reported dizziness BP 138/51 after activity    Home Living Family/patient expects to be discharged to:: Private residence Living Arrangements: Spouse/significant other Available Help at Discharge: Family;Available PRN/intermittently Type of Home: House Home Access: Stairs to enter   Entrance Stairs-Number of Steps: 2 Home Layout: One level Home Equipment: Walker - 2 wheels;Cane - single point;Bedside commode      Prior Function Level of Independence: Independent         Comments: pt was assisting caring for wife who has had multiple surgeries this year, no one able to provide 24hr care for him     Hand Dominance        Extremity/Trunk Assessment   Upper Extremity Assessment: Generalized weakness           Lower Extremity Assessment: Generalized weakness      Cervical / Trunk Assessment: Kyphotic  Communication   Communication: HOH  Cognition Arousal/Alertness: Awake/alert Behavior During Therapy: Flat affect Overall Cognitive Status: Within Functional Limits for tasks assessed  General Comments      Exercises        Assessment/Plan    PT Assessment Patient needs continued PT services  PT Diagnosis Difficulty walking;Generalized weakness;Acute pain   PT Problem List Decreased strength;Decreased activity tolerance;Decreased safety awareness;Decreased balance;Decreased knowledge of use of DME;Decreased knowledge of  precautions;Pain;Decreased mobility  PT Treatment Interventions Gait training;DME instruction;Functional mobility training;Therapeutic activities;Therapeutic exercise;Patient/family education   PT Goals (Current goals can be found in the Care Plan section) Acute Rehab PT Goals Patient Stated Goal: return to chat rooms on the computer for horse racing PT Goal Formulation: With patient Time For Goal Achievement: 01/13/14 Potential to Achieve Goals: Good    Frequency Min 3X/week   Barriers to discharge Decreased caregiver support      Co-evaluation               End of Session Equipment Utilized During Treatment: Gait belt Activity Tolerance: Patient limited by fatigue Patient left: in bed;with call bell/phone within reach;with family/visitor present Nurse Communication: Mobility status;Precautions         Time: 4580-9983 PT Time Calculation (min) (ACUTE ONLY): 27 min   Charges:   PT Evaluation $Initial PT Evaluation Tier I: 1 Procedure PT Treatments $Therapeutic Activity: 8-22 mins   PT G CodesMelford Aase 12/30/2013, 8:46 AM [name], [name]

## 2013-12-31 ENCOUNTER — Inpatient Hospital Stay (HOSPITAL_COMMUNITY): Payer: Medicare Other

## 2013-12-31 LAB — ANAEROBIC CULTURE

## 2013-12-31 LAB — CBC
HCT: 23.8 % — ABNORMAL LOW (ref 39.0–52.0)
HEMOGLOBIN: 7.8 g/dL — AB (ref 13.0–17.0)
MCH: 29.5 pg (ref 26.0–34.0)
MCHC: 32.8 g/dL (ref 30.0–36.0)
MCV: 90.2 fL (ref 78.0–100.0)
Platelets: 114 10*3/uL — ABNORMAL LOW (ref 150–400)
RBC: 2.64 MIL/uL — ABNORMAL LOW (ref 4.22–5.81)
RDW: 14.6 % (ref 11.5–15.5)
WBC: 8.3 10*3/uL (ref 4.0–10.5)

## 2013-12-31 LAB — BASIC METABOLIC PANEL
Anion gap: 13 (ref 5–15)
BUN: 39 mg/dL — AB (ref 6–23)
CHLORIDE: 97 meq/L (ref 96–112)
CO2: 28 mEq/L (ref 19–32)
CREATININE: 1.32 mg/dL (ref 0.50–1.35)
Calcium: 8.1 mg/dL — ABNORMAL LOW (ref 8.4–10.5)
GFR calc non Af Amer: 49 mL/min — ABNORMAL LOW (ref >90)
GFR, EST AFRICAN AMERICAN: 56 mL/min — AB (ref >90)
Glucose, Bld: 97 mg/dL (ref 70–99)
Potassium: 3.5 mEq/L — ABNORMAL LOW (ref 3.7–5.3)
Sodium: 138 mEq/L (ref 137–147)

## 2013-12-31 MED ORDER — POTASSIUM CHLORIDE 10 MEQ/50ML IV SOLN
10.0000 meq | INTRAVENOUS | Status: AC | PRN
  Administered 2013-12-31 (×3): 10 meq via INTRAVENOUS
  Filled 2013-12-31 (×2): qty 50

## 2013-12-31 MED ORDER — ACETAMINOPHEN 160 MG/5ML PO SOLN
1000.0000 mg | Freq: Four times a day (QID) | ORAL | Status: AC
  Administered 2013-12-31: 1000 mg via ORAL
  Filled 2013-12-31: qty 40.6

## 2013-12-31 MED ORDER — ACETAMINOPHEN 500 MG PO TABS
1000.0000 mg | ORAL_TABLET | Freq: Four times a day (QID) | ORAL | Status: AC
  Administered 2013-12-31 (×2): 1000 mg via ORAL

## 2013-12-31 NOTE — Plan of Care (Signed)
Problem: Phase II - Intermediate Post-Op Goal: Pain controlled with appropriate interventions Outcome: Completed/Met Date Met:  12/31/13 Goal: Advance Diet Outcome: Progressing Advanced to carb. modified diet but Pt reports poor appetite and significant sore throat for decreased intake. Will continue to encourage increase caloric intake. Goal: Activity Progressed Outcome: Progressing PT working with Pt d/t deconditioning, Pt's activity being advanced as tolerated but still only minimal ambulation distance tolerated.

## 2013-12-31 NOTE — Progress Notes (Signed)
[address]       [place] [address]             [phone]        CARDIOTHORACIC SURGERY PROGRESS NOTE   R5 Days Post-Op Procedure(s) (LRB): ASCENDING AORTIC  REPLACEMENT (N/A) INTRAOPERATIVE TRANSESOPHAGEAL ECHOCARDIOGRAM (N/A) LUNG BIOPSY (Left) CORONARY ARTERY BYPASS GRAFTING (CABG) times one using the left internal mammary artery. (N/A) AORTIC VALVE REPLACEMENT (AVR)  Subjective: Looks and feels a little better today.  Sore in chest.  Breathing comfortably.  Did better w/ ambulation  Objective: Vital signs: BP Readings from Last 1 Encounters:  12/31/13 123/52   Pulse Readings from Last 1 Encounters:  12/31/13 74   Resp Readings from Last 1 Encounters:  12/31/13 26   Temp Readings from Last 1 Encounters:  12/31/13 97.3 F (36.3 C) Oral    Hemodynamics:    Physical Exam:  Rhythm:   Very brief episode Afib this am during walk - otherwise NSR  Breath sounds: Coarse rhonchi  Heart sounds:  RRR  Incisions:  Clean and dry  Abdomen:  Soft, non-distended, non-tender  Extremities:  Warm, well-perfused    Intake/Output from previous day: 11/14 0701 - 11/15 0700 In: 940 [P.O.:720; I.V.:220] Out: 5038 [Urine:1215; Chest Tube:130] Intake/Output this shift: Total I/O In: 190 [I.V.:40; IV Piggyback:150] Out: 500 [Urine:500]  Lab Results:  CBC: Recent Labs  12/30/13 0428 12/31/13 0300  WBC 8.6 8.3  HGB 8.1* 7.8*  HCT 23.5* 23.8*  PLT 74* 114*    BMET:  Recent Labs  12/30/13 0428 12/31/13 0300  NA 138 138  K 3.9 3.5*  CL 98 97  CO2 27 28  GLUCOSE 102* 97  BUN 33* 39*  CREATININE 1.22 1.32  CALCIUM 8.1* 8.1*     CBG (last 3)   Recent Labs  12/29/13 2342 12/30/13 0352 12/30/13 0956  GLUCAP 106* 99 108*    ABG    Component Value Date/Time   PHART 7.505* 12/28/2013 1239   PCO2ART 35.4 12/28/2013 1239   PO2ART 65.0* 12/28/2013 1239   HCO3 27.9* 12/28/2013 1239   TCO2 29 12/28/2013 1239   ACIDBASEDEF 1.0  12/28/2013 0417   O2SAT 94.0 12/28/2013 1239    CXR: PORTABLE CHEST - 1 VIEW  COMPARISON: 12/30/2013  FINDINGS: Cardiomediastinal silhouette is stable. Persistent asymmetric interstitial prominence left lung. Asymmetric edema or pneumonitis cannot be excluded. Left chest tube is unchanged in position. Tiny left apical hydro pneumothorax. Right IJ sheath in place. Persistent hazy atelectasis or infiltrate right base medially.  IMPRESSION: Persistent asymmetric interstitial prominence left lung. Asymmetric edema or pneumonitis cannot be excluded. Left chest tube is unchanged in position. Tiny left apical hydro pneumothorax. Right IJ sheath in place. Persistent hazy atelectasis or infiltrate right base medially.   Electronically Signed  By: Lahoma Crocker M.D.  On: 12/31/2013 10:28   Assessment/Plan: S/P Procedure(s) (LRB): ASCENDING AORTIC  REPLACEMENT (N/A) INTRAOPERATIVE TRANSESOPHAGEAL ECHOCARDIOGRAM (N/A) LUNG BIOPSY (Left) CORONARY ARTERY BYPASS GRAFTING (CABG) times one using the left internal mammary artery. (N/A) AORTIC VALVE REPLACEMENT (AVR)  Overall stable POD5 Maintaining NSR w/ stable BP, 1 very brief episode PAF Marginal resp status, chest tube still draining although output trending down Expected post op acute blood loss anemia, Hgb down slightly Expected post op atelectasis, stable Expected post op volume excess, mild, diuresing some Post op thrombocytopenia, improved   Mobilize  Leave chest tube in for now  Watch anemia  Pulm toilet  OWEN,CLARENCE H 12/31/2013 11:50  AM

## 2013-12-31 NOTE — Progress Notes (Signed)
TCTS BRIEF SICU PROGRESS NOTE  5 Days Post-Op  S/P Procedure(s) (LRB): ASCENDING AORTIC  REPLACEMENT (N/A) INTRAOPERATIVE TRANSESOPHAGEAL ECHOCARDIOGRAM (N/A) LUNG BIOPSY (Left) CORONARY ARTERY BYPASS GRAFTING (CABG) times one using the left internal mammary artery. (N/A) AORTIC VALVE REPLACEMENT (AVR)   Stable day  Plan: Continue current plan  OWEN,CLARENCE H 12/31/2013 5:54 PM

## 2014-01-01 ENCOUNTER — Inpatient Hospital Stay (HOSPITAL_COMMUNITY): Payer: Medicare Other

## 2014-01-01 LAB — BASIC METABOLIC PANEL
ANION GAP: 12 (ref 5–15)
BUN: 37 mg/dL — ABNORMAL HIGH (ref 6–23)
CALCIUM: 8.4 mg/dL (ref 8.4–10.5)
CO2: 29 mEq/L (ref 19–32)
CREATININE: 1.25 mg/dL (ref 0.50–1.35)
Chloride: 97 mEq/L (ref 96–112)
GFR, EST AFRICAN AMERICAN: 60 mL/min — AB (ref >90)
GFR, EST NON AFRICAN AMERICAN: 52 mL/min — AB (ref >90)
Glucose, Bld: 102 mg/dL — ABNORMAL HIGH (ref 70–99)
Potassium: 4 mEq/L (ref 3.7–5.3)
Sodium: 138 mEq/L (ref 137–147)

## 2014-01-01 LAB — CBC
HCT: 26.4 % — ABNORMAL LOW (ref 39.0–52.0)
Hemoglobin: 8.9 g/dL — ABNORMAL LOW (ref 13.0–17.0)
MCH: 30.9 pg (ref 26.0–34.0)
MCHC: 33.7 g/dL (ref 30.0–36.0)
MCV: 91.7 fL (ref 78.0–100.0)
PLATELETS: 182 10*3/uL (ref 150–400)
RBC: 2.88 MIL/uL — ABNORMAL LOW (ref 4.22–5.81)
RDW: 14.7 % (ref 11.5–15.5)
WBC: 11.5 10*3/uL — ABNORMAL HIGH (ref 4.0–10.5)

## 2014-01-01 LAB — GLUCOSE, CAPILLARY: Glucose-Capillary: 111 mg/dL — ABNORMAL HIGH (ref 70–99)

## 2014-01-01 MED ORDER — PANTOPRAZOLE SODIUM 40 MG PO TBEC
40.0000 mg | DELAYED_RELEASE_TABLET | Freq: Every day | ORAL | Status: DC
  Administered 2014-01-02 – 2014-01-09 (×8): 40 mg via ORAL
  Filled 2014-01-01 (×7): qty 1

## 2014-01-01 MED ORDER — ONDANSETRON HCL 4 MG PO TABS: 4.0000 mg | ORAL_TABLET | Freq: Four times a day (QID) | ORAL | Status: DC | PRN

## 2014-01-01 MED ORDER — INSULIN ASPART 100 UNIT/ML ~~LOC~~ SOLN
0.0000 [IU] | Freq: Three times a day (TID) | SUBCUTANEOUS | Status: DC
  Administered 2014-01-02: 2 [IU] via SUBCUTANEOUS

## 2014-01-01 MED ORDER — MOVING RIGHT ALONG BOOK
Freq: Once | Status: DC
  Filled 2014-01-01: qty 1

## 2014-01-01 MED ORDER — ONDANSETRON HCL 4 MG/2ML IJ SOLN: 4.0000 mg | Freq: Four times a day (QID) | INTRAMUSCULAR | Status: DC | PRN

## 2014-01-01 MED ORDER — DOCUSATE SODIUM 100 MG PO CAPS
200.0000 mg | ORAL_CAPSULE | Freq: Every day | ORAL | Status: DC
  Administered 2014-01-01 – 2014-01-09 (×9): 200 mg via ORAL
  Filled 2014-01-01 (×9): qty 2

## 2014-01-01 MED ORDER — SODIUM CHLORIDE 0.9 % IJ SOLN
3.0000 mL | Freq: Two times a day (BID) | INTRAMUSCULAR | Status: DC
  Administered 2014-01-01 – 2014-01-09 (×15): 3 mL via INTRAVENOUS

## 2014-01-01 MED ORDER — SODIUM CHLORIDE 0.9 % IJ SOLN: 3.0000 mL | INTRAMUSCULAR | Status: DC | PRN

## 2014-01-01 MED ORDER — ASPIRIN EC 325 MG PO TBEC
325.0000 mg | DELAYED_RELEASE_TABLET | Freq: Every day | ORAL | Status: DC
  Administered 2014-01-02 – 2014-01-09 (×8): 325 mg via ORAL
  Filled 2014-01-01 (×8): qty 1

## 2014-01-01 MED ORDER — BISACODYL 10 MG RE SUPP: 10.0000 mg | Freq: Every day | RECTAL | Status: DC | PRN

## 2014-01-01 MED ORDER — BISACODYL 5 MG PO TBEC
10.0000 mg | DELAYED_RELEASE_TABLET | Freq: Every day | ORAL | Status: DC | PRN
  Administered 2014-01-03 – 2014-01-06 (×3): 10 mg via ORAL
  Filled 2014-01-01 (×3): qty 2

## 2014-01-01 MED ORDER — METOPROLOL TARTRATE 12.5 MG HALF TABLET
12.5000 mg | ORAL_TABLET | Freq: Two times a day (BID) | ORAL | Status: DC
  Administered 2014-01-01 – 2014-01-09 (×15): 12.5 mg via ORAL
  Filled 2014-01-01 (×17): qty 1

## 2014-01-01 MED ORDER — TRAMADOL HCL 50 MG PO TABS
50.0000 mg | ORAL_TABLET | Freq: Four times a day (QID) | ORAL | Status: DC
  Administered 2014-01-01 – 2014-01-09 (×29): 50 mg via ORAL
  Filled 2014-01-01 (×30): qty 1

## 2014-01-01 MED ORDER — OXYCODONE HCL 5 MG PO TABS
5.0000 mg | ORAL_TABLET | ORAL | Status: DC | PRN
  Administered 2014-01-01 – 2014-01-09 (×14): 5 mg via ORAL
  Filled 2014-01-01 (×14): qty 1

## 2014-01-01 MED ORDER — SODIUM CHLORIDE 0.9 % IV SOLN: 250.0000 mL | INTRAVENOUS | Status: DC | PRN

## 2014-01-01 NOTE — Progress Notes (Signed)
Patient ID: Alfred Hall, male   DOB: [birth year], 80 y.o.   MRN: [id number] TCTS DAILY ICU PROGRESS NOTE                   [address]            [place] [address]          [phone]   6 Days Post-Op Procedure(s) (LRB): ASCENDING AORTIC  REPLACEMENT (N/A) INTRAOPERATIVE TRANSESOPHAGEAL ECHOCARDIOGRAM (N/A) LUNG BIOPSY (Left) CORONARY ARTERY BYPASS GRAFTING (CABG) times one using the left internal mammary artery. (N/A) AORTIC VALVE REPLACEMENT (AVR)  Total Length of Stay:  LOS: 6 days   Subjective: Alert and neuro intact  Objective: Vital signs in last 24 hours: Temp:  [97.6 F (36.4 C)-98.1 F (36.7 C)] 97.9 F (36.6 C) (11/16 0720) Pulse Rate:  [30-82] 71 (11/16 0800) Cardiac Rhythm:  [-] Normal sinus rhythm (11/16 0800) Resp:  [15-30] 22 (11/16 0800) BP: (111-151)/(42-67) 117/47 mmHg (11/16 0800) SpO2:  [88 %-100 %] 95 % (11/16 0800) Weight:  [153 lb 10.6 oz (69.7 kg)] 153 lb 10.6 oz (69.7 kg) (11/16 0600)  Filed Weights   12/30/13 0500 12/31/13 0615 01/01/14 0600  Weight: 159 lb 6.3 oz (72.3 kg) 156 lb 4.9 oz (70.9 kg) 153 lb 10.6 oz (69.7 kg)    Weight change: -2 lb 10.3 oz (-1.2 kg)   Hemodynamic parameters for last 24 hours:    Intake/Output from previous day: 11/15 0701 - 11/16 0700 In: 1580 [P.O.:1200; I.V.:230; IV Piggyback:150] Out: 2315 [Urine:1875; Chest Tube:440]  Intake/Output this shift: Total I/O In: 10 [I.V.:10] Out: -   Current Meds: Scheduled Meds: . amiodarone  200 mg Oral Q12H   Followed by  . [START ON 01/06/2014] amiodarone  200 mg Oral Daily  . antiseptic oral rinse  7 mL Mouth Rinse BID  . aspirin EC  81 mg Oral Daily  . atorvastatin  20 mg Oral q1800  . bisacodyl  10 mg Oral Daily   Or  . bisacodyl  10 mg Rectal Daily  . docusate sodium  200 mg Oral Daily  . feeding supplement (ENSURE COMPLETE)  237 mL Oral BID BM  . furosemide  40 mg Oral Daily  . metoprolol tartrate  12.5 mg Oral BID  . multivitamin  with minerals  1 tablet Oral Daily  . pantoprazole  40 mg Oral Daily  . potassium chloride  20 mEq Oral Daily  . sodium chloride  3 mL Intravenous Q12H   Continuous Infusions: . sodium chloride    . sodium chloride 10 mL/hr (12/31/13 0341)   PRN Meds:.metoprolol, morphine injection, ondansetron (ZOFRAN) IV, oxyCODONE, phenol, sodium chloride, traMADol  General appearance: alert, cooperative and appears stated age Neurologic: intact Heart: regular rate and rhythm, S1, S2 normal, no murmur, click, rub or gallop Lungs: diminished breath sounds bibasilar Abdomen: soft, non-tender; bowel sounds normal; no masses,  no organomegaly Extremities: extremities normal, atraumatic, no cyanosis or edema and Homans sign is negative, no sign of DVT Wound: sternum stable  Lab Results: CBC: Recent Labs  12/31/13 0300 01/01/14 0328  WBC 8.3 11.5*  HGB 7.8* 8.9*  HCT 23.8* 26.4*  PLT 114* 182   BMET:  Recent Labs  12/31/13 0300 01/01/14 0328  NA 138 138  K 3.5* 4.0  CL 97 97  CO2 28 29  GLUCOSE 97 102*  BUN 39* 37*  CREATININE 1.32 1.25  CALCIUM 8.1* 8.4    PT/INR: No results for input(s):  LABPROT, INR in the last 72 hours. Radiology: Dg Chest Port 1 View  01/01/2014   CLINICAL DATA:  80 year old male with ascending aortic replacement. Pneumonia. Subsequent encounter.  EXAM: PORTABLE CHEST - 1 VIEW  COMPARISON:  12/31/2013.  FINDINGS: Post valve replacement.  Heart size top-normal.  Calcified mildly tortuous aorta.  Left-sided chest tube in place without evidence of gross pneumothorax.  Right central line tip just proximal to the superior vena cava level unchanged in position from the prior exam.  Pulmonary vascular congestion/ pulmonary edema  Consolidation lung bases may represent pleural fluid with atelectasis. Difficult to exclude infiltrate given this appearance.  IMPRESSION: Mild progression of pulmonary vascular congestion/ pulmonary edema.  Consolidation lung bases may represent  pleural fluid with atelectasis. Difficult to exclude infiltrate given this appearance.  Left-sided chest tube in place without evidence of gross pneumothorax.  Remainder of findings without change.   Electronically Signed   By: Chauncey Cruel M.D.   On: 01/01/2014 08:04   Dg Chest Port 1 View  12/31/2013   CLINICAL DATA:  Atelectasis, history of CABG  EXAM: PORTABLE CHEST - 1 VIEW  COMPARISON:  12/30/2013  FINDINGS: Cardiomediastinal silhouette is stable. Persistent asymmetric interstitial prominence left lung. Asymmetric edema or pneumonitis cannot be excluded. Left chest tube is unchanged in position. Tiny left apical hydro pneumothorax. Right IJ sheath in place. Persistent hazy atelectasis or infiltrate right base medially.  IMPRESSION: Persistent asymmetric interstitial prominence left lung. Asymmetric edema or pneumonitis cannot be excluded. Left chest tube is unchanged in position. Tiny left apical hydro pneumothorax. Right IJ sheath in place. Persistent hazy atelectasis or infiltrate right base medially.   Electronically Signed   By: Lahoma Crocker M.D.   On: 12/31/2013 10:28     Assessment/Plan: S/P Procedure(s) (LRB): ASCENDING AORTIC  REPLACEMENT (N/A) INTRAOPERATIVE TRANSESOPHAGEAL ECHOCARDIOGRAM (N/A) LUNG BIOPSY (Left) CORONARY ARTERY BYPASS GRAFTING (CABG) times one using the left internal mammary artery. (N/A) AORTIC VALVE REPLACEMENT (AVR) Mobilize Diuresis Plan for transfer to step-down: see transfer orders D/c right ij line  Leave chest tube for now  Continues pas hose no heparin, platelet  count has increased  Brief episode of a fib on cordarone and beta blocker  Thurston Brendlinger B 01/01/2014 9:05 AM

## 2014-01-01 NOTE — Progress Notes (Signed)
CT surgery p.m. Rounds  Patient had a stable day, complaining of poor appetite compared to yesterday Minimal drainage from chest tube Waiting for transfer to step down unit Vital signs stable-sinus rhythm Abdomen soft nontender, lungs clear Doing well

## 2014-01-01 NOTE — Clinical Social Work Note (Signed)
Received social work consult did not have time to assess will complete tomorrow.  Jones Broom. [place], MSW, [place] 01/01/2014 5:20 PM

## 2014-01-01 NOTE — Progress Notes (Addendum)
NUTRITION FOLLOW UP  INTERVENTION: Continue Ensure Complete po BID, each supplement provides 220 kcal and 10 grams of protein RD to follow for nutrition care plan  NUTRITION DIAGNOSIS: Inadequate oral intake now related to poor appetite as evidenced by pt report, ongoing  New Goal: Pt to meet >/= 90% of their estimated nutrition needs, unmet  Monitor:  PO & supplemental intake, weight, labs, I/O's  ASSESSMENT: 80 y.o. Male with newly diagnosed Lung mass/aortic stenosis/ascending aortic aneurysm/abdominal aortic aneurysm; presented for surgical intervention.  Patient s/p procedures 11/10: ASCENDING AORTIC REPLACEMENT  LUNG BIOPSY (Left) CORONARY ARTERY BYPASS GRAFTING  AORTIC VALVE REPLACEMENT   Patient extubated 11/12.    Diet advanced to Clear Liquids 11/13, Carbohydrate Modified 11/14.  Pt reports a poor appetite.  PO intake variable at 10-50% per flowsheet records.  Pt receiving Ensure Complete supplements (ordered 11/14).  Height: Ht Readings from Last 1 Encounters:  12/28/13 5' 8.5" (1.74 m)    Weight: Wt Readings from Last 1 Encounters:  01/01/14 153 lb 10.6 oz (69.7 kg)    BMI:  Body mass index is 23.02 kg/(m^2).  Estimated Nutritional Needs: Kcal: 2000-2150 Protein: 120-130 gm Fluid: per MD  Skin: chest surgical incision   Diet Order: NPO   Intake/Output Summary (Last 24 hours) at 01/01/14 1224 Last data filed at 01/01/14 1000  Gross per 24 hour  Intake   1170 ml  Output   1615 ml  Net   -445 ml    Labs:   Recent Labs Lab 12/26/13 2230  12/27/13 0400 12/27/13 1635  12/30/13 0428 12/31/13 0300 01/01/14 0328  NA  --   < > 138  --   < > 138 138 138  K  --   < > 4.7  --   < > 3.9 3.5* 4.0  CL  --   < > 104  --   < > 98 97 97  CO2  --   --  23  --   < > 27 28 29   BUN  --   < > 18  --   < > 33* 39* 37*  CREATININE 0.99  < > 1.06 1.04  < > 1.22 1.32 1.25  CALCIUM  --   --  7.2*  --   < > 8.1* 8.1* 8.4  MG 2.8*  --  2.7* 2.4  --   --   --    --   GLUCOSE  --   < > 83  --   < > 102* 97 102*  < > = values in this interval not displayed.  CBG (last 3)   Recent Labs  12/29/13 2342 12/30/13 0352 12/30/13 0956  GLUCAP 106* 99 108*    Scheduled Meds: . amiodarone  200 mg Oral Q12H   Followed by  . [START ON 01/06/2014] amiodarone  200 mg Oral Daily  . antiseptic oral rinse  7 mL Mouth Rinse BID  . aspirin EC  81 mg Oral Daily  . atorvastatin  20 mg Oral q1800  . bisacodyl  10 mg Oral Daily   Or  . bisacodyl  10 mg Rectal Daily  . docusate sodium  200 mg Oral Daily  . feeding supplement (ENSURE COMPLETE)  237 mL Oral BID BM  . furosemide  40 mg Oral Daily  . metoprolol tartrate  12.5 mg Oral BID  . multivitamin with minerals  1 tablet Oral Daily  . pantoprazole  40 mg Oral Daily  . potassium chloride  20 mEq Oral Daily  . sodium chloride  3 mL Intravenous Q12H    Continuous Infusions: . sodium chloride    . sodium chloride 10 mL/hr (12/31/13 0341)    Past Medical History  Diagnosis Date  . Bronchogenic cancer of left lung     a. followed by multidisciplinary thoracic oncology clinic  . PVD (peripheral vascular disease)   . Microhematuria     a. workup negative  . Aortic aneurysm, abdominal   . Hypercholesteremia   . Hypertension   . Thoracic aortic aneurysm without rupture   . Aortic stenosis     a. ECHO at [place]- pk AV grad 77 with a mean rate of 53   . Tobacco abuse   . CAD (coronary artery disease)     a. s/p LHC on 09/18/13 with non-obs disease and severe AS  . COPD (chronic obstructive pulmonary disease)   . Hypoglycemia   . Abdominal aortic aneurysm     Past Surgical History  Procedure Laterality Date  . Cataract extraction    . Eye surgery      CATARACT  . Throat nodule excision    . Nose surgery    . Cardiac catheterization    . Tonsillectomy    . Appendectomy    . Ascending aortic root replacement N/A 12/26/2013    Procedure: ASCENDING AORTIC  REPLACEMENT;  Surgeon: Grace Isaac,  MD;  Location: [place];  Service: Open Heart Surgery;  Laterality: N/A;  . Intraoperative transesophageal echocardiogram N/A 12/26/2013    Procedure: INTRAOPERATIVE TRANSESOPHAGEAL ECHOCARDIOGRAM;  Surgeon: Grace Isaac, MD;  Location: [place];  Service: Open Heart Surgery;  Laterality: N/A;  . Lung biopsy Left 12/26/2013    Procedure: LUNG BIOPSY;  Surgeon: Grace Isaac, MD;  Location: [place];  Service: Open Heart Surgery;  Laterality: Left;  . Coronary artery bypass graft N/A 12/26/2013    Procedure: CORONARY ARTERY BYPASS GRAFTING (CABG) times one using the left internal mammary artery.;  Surgeon: Grace Isaac, MD;  Location: [place];  Service: Open Heart Surgery;  Laterality: N/A;  . Aortic valve replacement  12/26/2013    Procedure: AORTIC VALVE REPLACEMENT (AVR);  Surgeon: Grace Isaac, MD;  Location: [place];  Service: Open Heart Surgery;;    Arthur Holms, RD, LDN Pager #: [phone] After-Hours Pager #: [phone]

## 2014-01-01 NOTE — Progress Notes (Signed)
Transferred to 2W24 via wheelchair. Portable monitor and Oxygen on. Report given to Beverlee Nims, Therapist, sports. No Changes.

## 2014-01-01 NOTE — Care Management Note (Signed)
    Page 1 of 1   01/01/2014     11:01:28 AM CARE MANAGEMENT NOTE 01/01/2014  Patient:  Alfred Hall, Alfred Hall   Account Number:  [id number]  Date Initiated:  12/29/2013  Documentation initiated by:  Luz Lex  Subjective/Objective Assessment:   Post op AVR and ascending aorta root repair on 11-10 with post op bleeding.     Action/Plan:   Anticipated DC Date:  01/03/2014   Anticipated DC Plan:  [place]  CM consult      Choice offered to / List presented to:             Status of service:  In process, will continue to follow Medicare Important Message given?  YES (If response is "NO", the following Medicare IM given date fields will be blank) Date Medicare IM given:  01/01/2014 Medicare IM given by:  [facility] Date Additional Medicare IM given:   Additional Medicare IM given by:    Discharge Disposition:    Per UR Regulation:  Reviewed for med. necessity/level of care/duration of stay  If discussed at [place] of Stay Meetings, dates discussed:    Comments:  Contact:  Wisler,[place] Spouse [phone]      [facility] Daughter [phone]  [phone]  01-01-14 11am Luz Lex, RNBSN [phone] Lives at home with wife, but he cares for her.  Is able to be on own but he has his daughter and son in law check on her frequently.  Daughter is a Marine scientist here at hospital.  Is no appossed to going to SNF in [place] area.  SW referral placed.

## 2014-01-01 NOTE — Plan of Care (Signed)
Problem: Phase II - Intermediate Post-Op Goal: Advance Diet Outcome: Completed/Met Date Met:  01/01/14 Improvement noted on PO intake of carb mod diet, Pt eating 30-50% of all meals, Goal: Activity Progressed Outcome: Completed/Met Date Met:  01/01/14 Pt demonstrating increased tolerance for ambulation with assistance, able to ambulate 300' without difficulty. Will continue to increase frequency and distance as tolerated.

## 2014-01-02 ENCOUNTER — Inpatient Hospital Stay (HOSPITAL_COMMUNITY): Payer: Medicare Other

## 2014-01-02 LAB — BASIC METABOLIC PANEL
Anion gap: 13 (ref 5–15)
BUN: 33 mg/dL — ABNORMAL HIGH (ref 6–23)
CO2: 28 mEq/L (ref 19–32)
Calcium: 8.4 mg/dL (ref 8.4–10.5)
Chloride: 97 mEq/L (ref 96–112)
Creatinine, Ser: 1.35 mg/dL (ref 0.50–1.35)
GFR calc Af Amer: 55 mL/min — ABNORMAL LOW (ref >90)
GFR calc non Af Amer: 47 mL/min — ABNORMAL LOW (ref >90)
Glucose, Bld: 96 mg/dL (ref 70–99)
Potassium: 4.1 mEq/L (ref 3.7–5.3)
Sodium: 138 mEq/L (ref 137–147)

## 2014-01-02 LAB — GLUCOSE, CAPILLARY
Glucose-Capillary: 92 mg/dL (ref 70–99)
Glucose-Capillary: 108 mg/dL — ABNORMAL HIGH (ref 70–99)
Glucose-Capillary: 110 mg/dL — ABNORMAL HIGH (ref 70–99)
Glucose-Capillary: 128 mg/dL — ABNORMAL HIGH (ref 70–99)

## 2014-01-02 LAB — CBC
HCT: 26.8 % — ABNORMAL LOW (ref 39.0–52.0)
Hemoglobin: 8.6 g/dL — ABNORMAL LOW (ref 13.0–17.0)
MCH: 29.9 pg (ref 26.0–34.0)
MCHC: 32.1 g/dL (ref 30.0–36.0)
MCV: 93.1 fL (ref 78.0–100.0)
Platelets: 273 10*3/uL (ref 150–400)
RBC: 2.88 MIL/uL — ABNORMAL LOW (ref 4.22–5.81)
RDW: 15.1 % (ref 11.5–15.5)
WBC: 11.5 10*3/uL — ABNORMAL HIGH (ref 4.0–10.5)

## 2014-01-02 MED ORDER — ACETYLCYSTEINE 10 % IN SOLN
2.0000 mL | Freq: Four times a day (QID) | RESPIRATORY_TRACT | Status: DC
  Filled 2014-01-02 (×4): qty 4

## 2014-01-02 MED ORDER — GUAIFENESIN ER 600 MG PO TB12
600.0000 mg | ORAL_TABLET | Freq: Two times a day (BID) | ORAL | Status: DC
  Administered 2014-01-02 – 2014-01-09 (×15): 600 mg via ORAL
  Filled 2014-01-02 (×16): qty 1

## 2014-01-02 MED ORDER — LEVALBUTEROL HCL 0.63 MG/3ML IN NEBU
0.6300 mg | INHALATION_SOLUTION | Freq: Four times a day (QID) | RESPIRATORY_TRACT | Status: DC
  Administered 2014-01-02 – 2014-01-03 (×7): 0.63 mg via RESPIRATORY_TRACT
  Filled 2014-01-02 (×10): qty 3

## 2014-01-02 MED ORDER — LEVALBUTEROL HCL 0.63 MG/3ML IN NEBU
INHALATION_SOLUTION | RESPIRATORY_TRACT | Status: AC
  Filled 2014-01-02: qty 3

## 2014-01-02 MED ORDER — ACETYLCYSTEINE 20 % IN SOLN
2.0000 mL | Freq: Four times a day (QID) | RESPIRATORY_TRACT | Status: AC
  Administered 2014-01-02 – 2014-01-03 (×4): 2 mL via RESPIRATORY_TRACT
  Filled 2014-01-02 (×4): qty 4

## 2014-01-02 NOTE — Progress Notes (Signed)
[address]       [place] [address]             [phone]          7 Days Post-Op Procedure(s) (LRB): ASCENDING AORTIC  REPLACEMENT (N/A) INTRAOPERATIVE TRANSESOPHAGEAL ECHOCARDIOGRAM (N/A) LUNG BIOPSY (Left) CORONARY ARTERY BYPASS GRAFTING (CABG) times one using the left internal mammary artery. (N/A) AORTIC VALVE REPLACEMENT (AVR)  Subjective: Complaining of cough with thick sputum, sore in chest.  Having some trouble swallowing.  Walked in hall this am and felt pretty good when up.   Objective: Vital signs in last 24 hours: Patient Vitals for the past 24 hrs:  BP Temp Temp src Pulse Resp SpO2 Weight  01/02/14 0818 (!) 137/59 mmHg - - 84 - - -  01/02/14 0742 - - - 84 - 95 % -  01/02/14 0553 (!) 129/56 mmHg 99 F (37.2 C) Oral 79 18 93 % 159 lb 9.6 oz (72.394 kg)  01/01/14 2015 (!) 130/56 mmHg 98.9 F (37.2 C) Oral 83 20 98 % -  01/01/14 1900 137/68 mmHg - - 80 (!) 23 98 % -  01/01/14 1800 (!) 137/55 mmHg - - 81 (!) 24 97 % -  01/01/14 1700 (!) 127/42 mmHg - - 74 18 98 % -  01/01/14 1600 (!) 116/50 mmHg - - 71 17 95 % -  01/01/14 1541 - 98.9 F (37.2 C) Oral - - - -  01/01/14 1500 (!) 120/59 mmHg - - 76 19 97 % -  01/01/14 1400 111/62 mmHg - - 76 (!) 22 90 % -  01/01/14 1300 (!) 116/49 mmHg - - 68 (!) 26 95 % -  01/01/14 1200 (!) 123/52 mmHg - - 73 (!) 23 (!) 89 % -  01/01/14 1142 - 98.4 F (36.9 C) Oral - - - -  01/01/14 1100 - - - 82 (!) 34 100 % -  01/01/14 1000 (!) 124/51 mmHg - - 78 (!) 29 96 % -   Current Weight  01/02/14 159 lb 9.6 oz (72.394 kg)   BASELINE WEIGHT: 64 kg   Intake/Output from previous day: 11/16 0701 - 11/17 0700 In: 510 [P.O.:480; I.V.:30] Out: 795 [Urine:675; Chest Tube:120]  CBGs 111-96-92   PHYSICAL EXAM:  Heart: RRR Lungs: Few coarse BS bilaterally that clear with cough Wound: Clean and dry Extremities: No edema Chest tube: No air leak    Lab Results: CBC: Recent Labs  01/01/14 0328  01/02/14 0435  WBC 11.5* 11.5*  HGB 8.9* 8.6*  HCT 26.4* 26.8*  PLT 182 273   BMET:  Recent Labs  01/01/14 0328 01/02/14 0435  NA 138 138  K 4.0 4.1  CL 97 97  CO2 29 28  GLUCOSE 102* 96  BUN 37* 33*  CREATININE 1.25 1.35  CALCIUM 8.4 8.4    PT/INR: No results for input(s): LABPROT, INR in the last 72 hours.    Assessment/Plan: S/P Procedure(s) (LRB): ASCENDING AORTIC  REPLACEMENT (N/A) INTRAOPERATIVE TRANSESOPHAGEAL ECHOCARDIOGRAM (N/A) LUNG BIOPSY (Left) CORONARY ARTERY BYPASS GRAFTING (CABG) times one using the left internal mammary artery. (N/A) AORTIC VALVE REPLACEMENT (AVR)  CV- BPs generally stable, no further AF. Continue Amio, Lopressor.  Pulm- still needs aggressive pulm toilet.  Will add flutter valve, Mucinex, continue IS and wean O2 as able.  CT output 120 ml/past 24 hrs.  Hopefully can d/c CT soon. Did not have CXR this am, so will order one.  Cr up slightly,  continue to follow.  Expected postop blood loss anemia- H/H generally stable. Will follow.  Will have speech pathologist see pt AJ:LUNGBMBOMQ swallowing.  RN reports he does better with soft food, pills in applesauce, etc.  Deconditioning- Continue PT/CRPI.    LOS: 7 days    Ulric Salzman H 01/02/2014

## 2014-01-02 NOTE — Evaluation (Signed)
Clinical/Bedside Swallow Evaluation Patient Details  Name: KYIAN OBST MRN: [id number] Date of Birth: 1929/08/09  Today's Date: 01/02/2014 Time: 0175-1025 SLP Time Calculation (min) (ACUTE ONLY): 27 min  Past Medical History:  Past Medical History  Diagnosis Date  . Bronchogenic cancer of left lung     a. followed by multidisciplinary thoracic oncology clinic  . PVD (peripheral vascular disease)   . Microhematuria     a. workup negative  . Aortic aneurysm, abdominal   . Hypercholesteremia   . Hypertension   . Thoracic aortic aneurysm without rupture   . Aortic stenosis     a. ECHO at [place]- pk AV grad 77 with a mean rate of 53   . Tobacco abuse   . CAD (coronary artery disease)     a. s/p LHC on 09/18/13 with non-obs disease and severe AS  . COPD (chronic obstructive pulmonary disease)   . Hypoglycemia   . Abdominal aortic aneurysm    Past Surgical History:  Past Surgical History  Procedure Laterality Date  . Cataract extraction    . Eye surgery      CATARACT  . Throat nodule excision    . Nose surgery    . Cardiac catheterization    . Tonsillectomy    . Appendectomy    . Ascending aortic root replacement N/A 12/26/2013    Procedure: ASCENDING AORTIC  REPLACEMENT;  Surgeon: Grace Isaac, MD;  Location: [place];  Service: Open Heart Surgery;  Laterality: N/A;  . Intraoperative transesophageal echocardiogram N/A 12/26/2013    Procedure: INTRAOPERATIVE TRANSESOPHAGEAL ECHOCARDIOGRAM;  Surgeon: Grace Isaac, MD;  Location: [place];  Service: Open Heart Surgery;  Laterality: N/A;  . Lung biopsy Left 12/26/2013    Procedure: LUNG BIOPSY;  Surgeon: Grace Isaac, MD;  Location: [place];  Service: Open Heart Surgery;  Laterality: Left;  . Coronary artery bypass graft N/A 12/26/2013    Procedure: CORONARY ARTERY BYPASS GRAFTING (CABG) times one using the left internal mammary artery.;  Surgeon: Grace Isaac, MD;  Location: [place];  Service: Open Heart Surgery;   Laterality: N/A;  . Aortic valve replacement  12/26/2013    Procedure: AORTIC VALVE REPLACEMENT (AVR);  Surgeon: Grace Isaac, MD;  Location: [facility];  Service: Open Heart Surgery;;   HPI:  Leretha Dykes 80 y.o. male  Was  seen in the office for newly diagnosed Lung mass/aortic stenosis/ascending aortic aneurysm/abdominal aortic aneurysm. The patient has been a long-term smoker x60 years with underlying COPD, limited in his pulmonary reserve but not on oxygen at home. He had a known abdominal aortic aneurysms followed at [facility] but not seen since 2013. He had a recent "checkup" at the [facility] in [facility], a chest x-ray suggested a little left upper lobe lung mass. His local provider in [place] arrange CT scan of the chest , PET scan, and echocardiogram. He is referred for further evaluation and deciding on a treatment plan.   Assessment / Plan / Recommendation Clinical Impression  Orders received; bedside swallow evaluation complete. Patient's overall swallowing function appears [facility] for tasks assessed. Patient demonstrates immediate throat clear and cough at baseline as well as with trials of solids and liquids via straw, suspect due to increased congestion and phlegm. Although patient demonstrates increased tolerance with softer textures, recommend continue on current diet of regular textures with thin liquids due to fact that patient can independently manage diet. Suspect over time, patient will demonstrate improved endurance and tolerance  with consistencies. As a result, skilled SLP intervention is not warranted at this time.     Aspiration Risk  Mild    Diet Recommendation Regular textures and thin liquids   Other  Recommendations Oral Care Recommendations: Oral care BID   Follow Up Recommendations  None             Swallow Study Prior Functional Status  Regular textures and thin liquids    General HPI: SELMER ADDUCI 80 y.o. male  Was  seen in the office  for newly diagnosed Lung mass/aortic stenosis/ascending aortic aneurysm/abdominal aortic aneurysm. The patient has been a long-term smoker x60 years with underlying COPD, limited in his pulmonary reserve but not on oxygen at home. He had a known abdominal aortic aneurysms followed at [facility] but not seen since 2013. He had a recent "checkup" at the [facility] in [facility], a chest x-ray suggested a little left upper lobe lung mass. His local provider in [place] arrange CT scan of the chest , PET scan, and echocardiogram. He is referred for further evaluation and deciding on a treatment plan. Type of Study: Bedside swallow evaluation Previous Swallow Assessment: N/A Diet Prior to this Study: Regular;Thin liquids Temperature Spikes Noted: No Respiratory Status: Nasal cannula History of Recent Intubation: Yes Length of Intubations (days): 2 days Date extubated: 12/28/13 Behavior/Cognition: Alert;Cooperative;Pleasant mood Oral Cavity - Dentition: Dentures, top;Dentures, bottom Self-Feeding Abilities: Able to feed self Patient Positioning: Upright in bed Baseline Vocal Quality: Hoarse Volitional Cough: Congested (at baseline as well as with trials) Volitional Swallow: Able to elicit    Oral/Motor/Sensory Function Overall Oral Motor/Sensory Function: Appears within functional limits for tasks assessed   Ice Chips Ice chips: Not tested   Thin Liquid Thin Liquid: Within functional limits Presentation: Cup;Straw    Nectar Thick Nectar Thick Liquid: Not tested   Honey Thick Honey Thick Liquid: Not tested   Puree Puree: Not tested   Solid   GO    Solid: Within functional limits       Jarad Barth 01/02/2014,5:22 PM

## 2014-01-02 NOTE — Progress Notes (Signed)
Chest tube d/c at this time; dressing intact; pt lying in bed; will cont. To monitor.

## 2014-01-02 NOTE — Progress Notes (Signed)
Physical Therapy Treatment Patient Details Name: Alfred Hall MRN: [id number] DOB: 1929-07-24 Today's Date: 01/02/2014    History of Present Illness Pt with severe aortics stenosis and left lung mass s/p CABGx 1, AVR, ascending aorta replacement, and lung biopsy    PT Comments    Patient is having difficulty recalling sternal precautions and needed cueing to maintain them throughout the session.  Educated on bed mobility and correct procedure to maintain precautions while scooting toward EOB in preparation to stand.  HR between 79-102 during transfers and ambulation with some brief spikes in HR with exertion.  BP 137/59 in seated position, taken after pt complaint of dizziness.  SpO2 90%-93% on 3L supplemental O2 during transfers and ambulation.  Patient had two episodes of productive cough.  Will continue to follow as patient is progressing.   Follow Up Recommendations  SNF;Supervision for mobility/OOB     Equipment Recommendations       Recommendations for Other Services       Precautions / Restrictions Precautions Precautions: Sternal;Fall Precaution Comments: chest tube    Mobility  Bed Mobility Overal bed mobility: Needs Assistance Bed Mobility: Rolling;Sidelying to Sit Rolling: Min assist Sidelying to sit: Mod assist       General bed mobility comments: cues for sequence and to maintain precautions with assist to elevate trunk and mod assist for reciprocal scooting to EOB  Transfers Overall transfer level: Needs assistance Equipment used: Rolling walker (2 wheeled) Transfers: Sit to/from Stand Sit to Stand: Min assist         General transfer comment: cues for hand placement, anterior translation, sequence and safety with assist for powering up  Ambulation/Gait Ambulation/Gait assistance: Min assist Ambulation Distance (Feet): 150 Feet Assistive device: Rolling walker (2 wheeled) Gait Pattern/deviations: Wide base of support;Shuffle;Trunk  flexed;Decreased stride length   Gait velocity interpretation: Below normal speed for age/gender General Gait Details: pt shuffling gait with cues for posture, increased stride and stepping into RW limited by fatigue   Stairs            Wheelchair Mobility    Modified Rankin (Stroke Patients Only)       Balance Overall balance assessment: Needs assistance   Sitting balance-Leahy Scale: Fair       Standing balance-Leahy Scale: Poor                      Cognition Arousal/Alertness: Awake/alert Behavior During Therapy: Flat affect Overall Cognitive Status: Within Functional Limits for tasks assessed                      Exercises      General Comments        Pertinent Vitals/Pain Pain Assessment: 0-10 Pain Score: 5  Pain Location: sternal Pain Descriptors / Indicators: Aching Pain Intervention(s): Repositioned;Limited activity within patient's tolerance  SpO2: 90-93% during transfers and ambulation (3L) HR: 79-102  BP seated: 137/59    Home Living                      Prior Function            PT Goals (current goals can now be found in the care plan section) Progress towards PT goals: Progressing toward goals    Frequency       PT Plan Current plan remains appropriate    Co-evaluation             End of Session Equipment Utilized  During Treatment: Gait belt;Oxygen Activity Tolerance: Patient limited by fatigue Patient left: in chair;with call bell/phone within reach     Time: 0729-0807 PT Time Calculation (min) (ACUTE ONLY): 38 min  Charges:  $Gait Training: 8-22 mins $Therapeutic Activity: 23-37 mins                    G Codes:      Rakesha Dalporto SPT 01/02/2014, 9:24 AM

## 2014-01-02 NOTE — Clinical Social Work Note (Signed)
Met with patient and his daughter to discuss going to a SNF once he is medically stable.  Patient and daughter in agreement to going to SNF for short term rehab.  Patient states he is from [place] and would like to go to [facility] in [place], as first choice, second choice would be [facility] in [place], third choice [facility] in [place], and [facility] in [place].  Assessment will be completed tomorrow, and facilities will be contacted to see if they can accept patient once he is medically stable and orders received.  Jones Broom. [place], MSW, [place] 01/02/2014 5:22 PM

## 2014-01-02 NOTE — Psychosocial Assessment (Signed)
Pt sitting up in chair eating lunch at this time; will d/c chest tube once pt finished with lunch; will cont. To monitor.

## 2014-01-02 NOTE — Progress Notes (Signed)
CARDIAC REHAB PHASE I   PRE:  Rate/Rhythm: 78 SR     BP: sitting 110/64    SaO2: 94 3L  MODE:  Ambulation: 150 ft   POST:  Rate/Rhythm: 94 SR    BP: sitting 123/59     SaO2: 94 4L  Pt moving fairly well, slight assist to stand. Used RW, 4L , assist x1 with gait belt.  Fatigue and SOB, rest x2. SaO2 good on 4L (difficult to register, used hot pack). Return to recliner. Small steps at times, able to correct. Will f/u. [place], [place], ACSM 01/02/2014 12:23 PM

## 2014-01-03 ENCOUNTER — Inpatient Hospital Stay (HOSPITAL_COMMUNITY): Payer: Medicare Other

## 2014-01-03 LAB — GLUCOSE, CAPILLARY
Glucose-Capillary: 113 mg/dL — ABNORMAL HIGH (ref 70–99)
Glucose-Capillary: 125 mg/dL — ABNORMAL HIGH (ref 70–99)
Glucose-Capillary: 120 mg/dL — ABNORMAL HIGH (ref 70–99)

## 2014-01-03 MED ORDER — FUROSEMIDE 10 MG/ML IJ SOLN
40.0000 mg | Freq: Once | INTRAMUSCULAR | Status: AC
  Administered 2014-01-03: 40 mg via INTRAVENOUS
  Filled 2014-01-03: qty 4

## 2014-01-03 NOTE — Clinical Social Work Placement (Addendum)
Clinical Social Work Department CLINICAL SOCIAL WORK PLACEMENT NOTE 01/03/2014  Patient:  Alfred Hall, Alfred Hall  Account Number:  [id number] Admit date:  12/26/2013  Clinical Social Worker:  Aliece Honold, LCSWA  Date/time:  01/03/2014 11:46 AM  Clinical Social Work is seeking post-discharge placement for this patient at the following level of care:   [place]   (*CSW will update this form in Epic as items are completed)   01/02/2014  Patient/family provided with [place] Department of Clinical Social Work's list of facilities offering this level of care within the geographic area requested by the patient (or if unable, by the patient's family).  01/02/2014  Patient/family informed of their freedom to choose among providers that offer the needed level of care, that participate in Medicare, Medicaid or managed care program needed by the patient, have an available bed and are willing to accept the patient.  01/02/2014  Patient/family informed of [facility]' ownership interest in [facility], as well as of the fact that they are under no obligation to receive care at this facility.  PASARR submitted to EDS on 01/03/2014 PASARR number received on   FL2 transmitted to all facilities in geographic area requested by pt/family on  01/03/2014 FL2 transmitted to all facilities within larger geographic area on 01/03/2014  Patient informed that his/her managed care company has contracts with or will negotiate with  certain facilities, including the following:     Patient/family informed of bed offers received: 01/08/14   Patient chooses bed at [facility]  Physician recommends and patient chooses bed at    Patient to be transferred to [facility] on 01/09/14   Patient to be transferred to facility by [facility] Patient and family notified of transfer on 01/09/14 Name of family member notified:  Vilinda Blanks    The following physician request were entered in  Epic:   Additional Comments:  Jones Broom. [place], MSW, [place] 01/09/2014 1:23 PM

## 2014-01-03 NOTE — Progress Notes (Signed)
Pulled bilateral pacing wires. Pt tolerated well. Wires intact. Vs stable. Pt remained on bedrest for 1hr. Monitoring will continue.

## 2014-01-03 NOTE — Progress Notes (Signed)
CARDIAC REHAB PHASE I   PRE:  Rate/Rhythm: 82 SR  BP:  Supine:   Sitting: 128/56  Standing:    SaO2: 94% 4L  Hard to register. Ear did not work. Had to get hot pack  MODE:  Ambulation: 210 ft   POST:  Rate/Rhythm: 86SR  BP:  Supine:   Sitting: 115/39  Standing:    SaO2: 93-94% 4L 1124-1210 Pt walked 210 ft on 4L with rolling walker, gait belt and asst x 1 with shuffling gait. Needs to take bigger steps. Pt stated he did not seem to be able to get feet coordinated to walk as he should. C/o legs weak and arms tired by end of walk. Stopped several times to rest due to generalized weakness. Stopped by sink to rinse mouth and take dentures out. To bed after walk. Helped pt rinse mouth again. Had to get hot pack for sats to register. Left on 4L. DOE.   Graylon Good, RN BSN  01/03/2014 12:05 PM

## 2014-01-03 NOTE — Clinical Social Work Psychosocial (Addendum)
Clinical Social Work Department BRIEF PSYCHOSOCIAL ASSESSMENT 01/02/2014  Patient:  RAYMAN, PETROSIAN     Account Number:  [id number]     Admit date:  12/26/2013  Clinical Social Worker:  Dian Queen  Date/Time:  01/02/2014 11:36 AM  Referred by:  Physician  Date Referred:  01/02/2014 Referred for  SNF Placement   Other Referral:   Interview type:  Patient Other interview type:   daughter    PSYCHOSOCIAL DATA Living Status:  WIFE Admitted from facility:   Level of care:   Primary support name:  Vilinda Blanks Primary support relationship to patient:  FAMILY Degree of support available:   Patient is primary caregiver for his wife who he lives with, but his daughter is very supportive with helping advocate for him.    CURRENT CONCERNS Current Concerns  Post-Acute Placement   Other Concerns:    SOCIAL WORK ASSESSMENT / PLAN Patient is a 80 year old male who is alert and oriented x3. Patient lives with his wife in [place], but he is primary carer for her.  Patient has a daughter who lives in the [place] area, but is active in his care. Patient was explained that PT is recommending SNF once he is medically ready.  Explained to patient about rehab and asked if he had ever been, he said he has not but his wife has so his is familiar with the system.  Patient's daughter is a Marine scientist at [employer] and is able to explain to him anything he does not understand.  Patient and family are in agreement to discharge to a SNF once he is medically ready and discharge orders have been given.  Patient states he would prefer to go to [facility] because his wife has been there, and it is close to where he lives.   Assessment/plan status:   Other assessment/ plan:   Information/referral to community resources:    PATIENT'S/FAMILY'S RESPONSE TO PLAN OF CARE: Patient and daughter in agreement to plan of care.   Jones Broom. [place], MSW, [place] 01/03/2014 11:44  AM

## 2014-01-03 NOTE — Clinical Social Work Note (Signed)
Spoke to patient and informed him that CSW spoke to [facility] which is his first choice would be willing to take him.  Patient then asked if information could be faxed out to the [facility] facilities, since he has family here.  Patient was faxed out to [facility] facilities.  Jones Broom. [place], MSW, [place] 01/03/2014 4:11 PM

## 2014-01-03 NOTE — Progress Notes (Signed)
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 Requires min assist for transfers and ambulation, fatigues easily and requires several rest breaks to complete gait training today. SpO2 down to 90% on 4L but quickly rises to >92% at rest with pursed lip breathing. Pt with better adherence to sternal precautions. Patient will continue to benefit from skilled physical therapy services to further improve independence with functional mobility.   Follow Up Recommendations  SNF;Supervision for mobility/OOB     Equipment Recommendations  Rolling walker with 5" wheels    Recommendations for Other Services       Precautions / Restrictions Precautions Precautions: Sternal;Fall Restrictions Weight Bearing Restrictions: Yes Other Position/Activity Restrictions: Sternal precautions    Mobility  Bed Mobility Overal bed mobility: Needs Assistance Bed Mobility: Sidelying to Sit;Rolling Rolling: Supervision Sidelying to sit: Min assist       General bed mobility comments: Educated on log roll technique. Min assist for truncal support to rise. Able to follow sternal precautions.  Transfers Overall transfer level: Needs assistance Equipment used: Rolling walker (2 wheeled) Transfers: Sit to/from Stand Sit to Stand: Min assist         General transfer comment: Min assist for boost to stand from lowest bed setting. VC for hand placement. able to maintain sternal precautions. Mild instability until holding RW with both hands.  Ambulation/Gait Ambulation/Gait assistance: Min assist Ambulation Distance (Feet): 150 Feet Assistive device: Rolling walker (2 wheeled) Gait Pattern/deviations: Step-through  pattern;Shuffle;Decreased stride length;Trunk flexed   Gait velocity interpretation: Below normal speed for age/gender General Gait Details: Shuffling initially, correctly fairly well with verbal cues. Min assist for stability at times. Pt requires several standing rest breaks due to fatigue and SOB. SpO2 down to 90% on 4L supplemental O2, but quickly rises >92% while sitting with cues for pursed lip breathing. HR up to 96 bpm while ambulating.    Stairs            Wheelchair Mobility    Modified Rankin (Stroke Patients Only)       Balance                                    Cognition Arousal/Alertness: Awake/alert Behavior During Therapy: WFL for tasks assessed/performed Overall Cognitive Status: Within Functional Limits for tasks assessed                      Exercises General Exercises - Lower Extremity Ankle Circles/Pumps: AROM;Both;10 reps;Seated Quad Sets: Strengthening;Both;10 reps;Seated Gluteal Sets: Strengthening;Both;10 reps;Seated Long Arc Quad: Strengthening;Both;10 reps;Seated    General Comments General comments (skin integrity, edema, etc.): Time spent discussing and educating pt on home living safety, progression of exercises, pursed lip breathing techniques, and energy conservation techniques.      Pertinent Vitals/Pain Pain Assessment: No/denies pain Pain Descriptors / Indicators:  (Hurts when I cough) Pain Intervention(s): Monitored during session;Repositioned   SpO2 93-98% on 4L edge of bed and supine respectively --  HR 88  SpO2 90% on 4L supplemental O2 while ambulating -- HR 96 bpm    Home Living  Prior Function            PT Goals (current goals can now be found in the care plan section) Acute Rehab PT Goals PT Goal Formulation: With patient Time For Goal Achievement: 01/13/14 Potential to Achieve Goals: Good Progress towards PT goals: Progressing toward goals    Frequency  Min  3X/week    PT Plan Current plan remains appropriate    Co-evaluation             End of Session Equipment Utilized During Treatment: Gait belt;Oxygen Activity Tolerance: Patient tolerated treatment well Patient left: in chair;with call bell/phone within reach     Time: 1509-1550 PT Time Calculation (min) (ACUTE ONLY): 41 min  Charges:  $Gait Training: 8-22 mins $Therapeutic Activity: 8-22 mins $Self Care/Home Management: 8-22                    G Codes:      Ellouise Newer Jan 13, 2014, 4:41 PM  Camille Bal [place], [place]

## 2014-01-03 NOTE — Progress Notes (Addendum)
      [address]       [place] [address]             [phone]        8 Days Post-Op Procedure(s) (LRB): ASCENDING AORTIC  REPLACEMENT (N/A) INTRAOPERATIVE TRANSESOPHAGEAL ECHOCARDIOGRAM (N/A) LUNG BIOPSY (Left) CORONARY ARTERY BYPASS GRAFTING (CABG) times one using the left internal mammary artery. (N/A) AORTIC VALVE REPLACEMENT (AVR)  Subjective: Patient did not feel well earlier this am. He had trouble taking a deep breath and felt like an elephant was sitting on his chest. He also had difficulty swallowing, which has since improved.  Objective: Vital signs in last 24 hours: Temp:  [97.9 F (36.6 C)-98.8 F (37.1 C)] 97.9 F (36.6 C) (11/18 0515) Pulse Rate:  [72-81] 74 (11/18 0837) Cardiac Rhythm:  [-] Normal sinus rhythm (11/18 0912) Resp:  [18] 18 (11/18 0837) BP: (99-124)/(49-70) 122/70 mmHg (11/18 0515) SpO2:  [92 %-100 %] 95 % (11/18 0837) Weight:  [148 lb 2.4 oz (67.2 kg)] 148 lb 2.4 oz (67.2 kg) (11/18 0515)  Pre op weight 64 kg Current Weight  01/03/14 148 lb 2.4 oz (67.2 kg)      Intake/Output from previous day: 11/17 0701 - 11/18 0700 In: 240 [P.O.:240] Out: 550 [Urine:550]   Physical Exam:  Cardiovascular: RRR Pulmonary: Diminished throughout Abdomen: Soft, non tender, bowel sounds present. Extremities: Trace lower extremity edema. Wounds: Clean and dry.  No erythema or signs of infection.  Lab Results: CBC: Recent Labs  01/01/14 0328 01/02/14 0435  WBC 11.5* 11.5*  HGB 8.9* 8.6*  HCT 26.4* 26.8*  PLT 182 273   BMET:  Recent Labs  01/01/14 0328 01/02/14 0435  NA 138 138  K 4.0 4.1  CL 97 97  CO2 29 28  GLUCOSE 102* 96  BUN 37* 33*  CREATININE 1.25 1.35  CALCIUM 8.4 8.4    PT/INR:  Lab Results  Component Value Date   INR 1.36 12/29/2013   INR 1.57* 12/28/2013   INR 1.47 12/27/2013   ABG:  INR: Will add last result for INR, ABG once components are confirmed Will add last 4 CBG results once  components are confirmed  Assessment/Plan:  1. CV - Previous a fib. Maintaining SR. On Amiodarone 200 bid, Lopressor 12.5 bid, 2.  Pulmonary -History of COPD.Mucinex bid for coughing and Xopenex QID. CXR shows small bilateral pleural effusions, no pneumothorax, and LUL opacity (possibly hematoma as had lung biopsy) unchanged. On 4 liters of oxygen via [place]-wean as tolerates. Encourage incentive spirometer and flutter valve. 3. Volume Overload - On Lasix 40 daily. Will give IV this am. 4.  Acute blood loss anemia - H and H yesterday stable at 8.6 and 26.8 5. CBGs 110/128/113. Pre op HGA1C 6. He is likely pre diabetic. Will stop accu checks and SS PRN. Will need further surveillance with medical doctor after discharge. 6. Check BMET in am as last creatinine 1.35 7. Re swallowing difficulties-speech path evaluated yesterday. 8. Remove EPW 9. Will need SNF when ready for discharge  ZIMMERMAN,DONIELLE MPA-C 01/03/2014,9:41 AM  I have seen and examined Leretha Dykes and agree with the above assessment  and plan.  Grace Isaac MD [facility] [phone] Office [phone] 01/03/2014 5:48 PM

## 2014-01-04 LAB — BASIC METABOLIC PANEL
Anion gap: 15 (ref 5–15)
BUN: 37 mg/dL — AB (ref 6–23)
CO2: 29 mEq/L (ref 19–32)
CREATININE: 1.38 mg/dL — AB (ref 0.50–1.35)
Calcium: 8.5 mg/dL (ref 8.4–10.5)
Chloride: 96 mEq/L (ref 96–112)
GFR, EST AFRICAN AMERICAN: 53 mL/min — AB (ref >90)
GFR, EST NON AFRICAN AMERICAN: 46 mL/min — AB (ref >90)
Glucose, Bld: 99 mg/dL (ref 70–99)
Potassium: 4 mEq/L (ref 3.7–5.3)
Sodium: 140 mEq/L (ref 137–147)

## 2014-01-04 MED ORDER — ACETYLCYSTEINE 20 % IN SOLN
3.0000 mL | Freq: Four times a day (QID) | RESPIRATORY_TRACT | Status: DC
  Filled 2014-01-04 (×4): qty 4

## 2014-01-04 MED ORDER — LEVALBUTEROL HCL 0.63 MG/3ML IN NEBU
0.6300 mg | INHALATION_SOLUTION | Freq: Three times a day (TID) | RESPIRATORY_TRACT | Status: DC
  Administered 2014-01-04 – 2014-01-09 (×15): 0.63 mg via RESPIRATORY_TRACT
  Filled 2014-01-04 (×32): qty 3

## 2014-01-04 MED ORDER — ACETYLCYSTEINE 20 % IN SOLN
3.0000 mL | Freq: Three times a day (TID) | RESPIRATORY_TRACT | Status: DC
  Administered 2014-01-04 – 2014-01-05 (×5): 3 mL via RESPIRATORY_TRACT
  Administered 2014-01-06: 09:00:00 via RESPIRATORY_TRACT
  Administered 2014-01-06 – 2014-01-08 (×5): 3 mL via RESPIRATORY_TRACT
  Filled 2014-01-04 (×15): qty 4

## 2014-01-04 NOTE — Plan of Care (Signed)
Problem: Phase III - Recovery through Discharge Goal: Maintain Hemodynamic Stability Outcome: Completed/Met Date Met:  01/04/14 Goal: Activity Progressed Outcome: Completed/Met Date Met:  01/04/14

## 2014-01-04 NOTE — Progress Notes (Signed)
Utilization review completed.  

## 2014-01-04 NOTE — Progress Notes (Addendum)
      [address]       [place] [address]             [phone]        9 Days Post-Op Procedure(s) (LRB): ASCENDING AORTIC  REPLACEMENT (N/A) INTRAOPERATIVE TRANSESOPHAGEAL ECHOCARDIOGRAM (N/A) LUNG BIOPSY (Left) CORONARY ARTERY BYPASS GRAFTING (CABG) times one using the left internal mammary artery. (N/A) AORTIC VALVE REPLACEMENT (AVR)  Subjective: Patient coughing a lot this am and with sternal pain. Passing flatus but no bowel movement in days.  Objective: Vital signs in last 24 hours: Temp:  [98.2 F (36.8 C)-98.3 F (36.8 C)] 98.2 F (36.8 C) (11/19 0429) Pulse Rate:  [74-80] 80 (11/19 0429) Cardiac Rhythm:  [-] Normal sinus rhythm (11/18 2114) Resp:  [18] 18 (11/19 0429) BP: (103-140)/(52-57) 104/55 mmHg (11/19 0429) SpO2:  [91 %-97 %] 91 % (11/19 0429) Weight:  [148 lb 9.4 oz (67.4 kg)] 148 lb 9.4 oz (67.4 kg) (11/19 0429)  Pre op weight 64 kg Current Weight  01/04/14 148 lb 9.4 oz (67.4 kg)      Intake/Output from previous day: 11/18 0701 - 11/19 0700 In: 360 [P.O.:360] Out: 925 [Urine:925]   Physical Exam:  Cardiovascular: RRR Pulmonary: Diminished throughout Abdomen: Soft, non tender, hypoactive bowel sounds present. Extremities: Trace lower extremity edema. Wounds: Clean and dry.  No erythema or signs of infection.  Lab Results: CBC:  Recent Labs  01/02/14 0435  WBC 11.5*  HGB 8.6*  HCT 26.8*  PLT 273   BMET:   Recent Labs  01/02/14 0435 01/04/14 0450  NA 138 140  K 4.1 4.0  CL 97 96  CO2 28 29  GLUCOSE 96 99  BUN 33* 37*  CREATININE 1.35 1.38*  CALCIUM 8.4 8.5    PT/INR:  Lab Results  Component Value Date   INR 1.36 12/29/2013   INR 1.57* 12/28/2013   INR 1.47 12/27/2013   ABG:  INR: Will add last result for INR, ABG once components are confirmed Will add last 4 CBG results once components are confirmed  Assessment/Plan:  1. CV - Previous a fib. Maintaining SR. On Amiodarone 200 bid,  Lopressor 12.5 bid.  2.  Pulmonary -History of COPD.Mucinex bid for coughing and Xopenex QID.  On 4 liters of oxygen via [place]-wean as tolerates, but likely will need oxygen at discharge.Encourage incentive spirometer and flutter valve. 3. Volume Overload - On Lasix 40 daily.  4.  Acute blood loss anemia - Last H and H stable at 8.6 and 26.8 6. Creatinine 1.38 7.GI- not eating much. Has been give Dulcolax,Miralax, and Lactulose. Not very distended or uncomfortable. Will not give any laxative today. Encourage oral intake and ambulation. 8. Will need SNF when ready for discharge  ZIMMERMAN,DONIELLE MPA-C 01/04/2014,7:39 AM   I have seen and examined Alfred Hall and agree with the above assessment  and plan.  Grace Isaac MD Beeper [phone] Office [phone] 01/04/2014 9:18 AM

## 2014-01-04 NOTE — Progress Notes (Signed)
Informed Donielle, PA pt hasnt had had a bowel movement since 12/26/13. No new orders given. Gave pt prn dulcolax. Pt has hypoactive bs and is passing gas.  Monitoring will continue.

## 2014-01-04 NOTE — Progress Notes (Signed)
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 Requires min assist with transfers, bed mobility and gait. He has difficulty remembering simple items concerning sternal precautions and safety with mobility today, which was taught during therapy session yesterday, and he also seems to be slower with processing information today. SpO2 down to 90% while on 4L supplemental O2 and ambulating, up to 92% with cues for pursed lip breathing. Patient will continue to benefit from skilled physical therapy services to further improve independence with functional mobility.   Follow Up Recommendations  SNF;Supervision for mobility/OOB     Equipment Recommendations  Rolling walker with 5" wheels    Recommendations for Other Services       Precautions / Restrictions Precautions Precautions: Sternal;Fall Restrictions Weight Bearing Restrictions: Yes Other Position/Activity Restrictions: Sternal precautions    Mobility  Bed Mobility Overal bed mobility: Needs Assistance Bed Mobility: Rolling;Sit to Sidelying Rolling: Supervision       Sit to sidelying: Min assist General bed mobility comments: Educated on log roll technique for entering bed. Required min assist for LE support into bed. Frequent cues throughout task for sequencing.  Transfers Overall transfer level: Needs assistance Equipment used: Rolling walker (2 wheeled) Transfers: Sit to/from Stand Sit to Stand: Min assist         General transfer comment: Continues to require min assist for boost to stand. Performed from recliner x2 and lowest bed setting. Max VC for sternal precautions with hand  placement.  Ambulation/Gait Ambulation/Gait assistance: Min assist Ambulation Distance (Feet): 160 Feet (standing rest breaks x3) Assistive device: Rolling walker (2 wheeled) Gait Pattern/deviations: Step-through pattern;Decreased stride length;Shuffle;Drifts right/left   Gait velocity interpretation: Below normal speed for age/gender General Gait Details: Continues to move slowly. VC for larger steps and responds well but needs cues intermittently. Educated on pusred lip breathing technique. SpO2 down to 90% while ambulating on 4L supplemental O2. min assist for stability and walker placement with turns.   Stairs            Wheelchair Mobility    Modified Rankin (Stroke Patients Only)       Balance                                    Cognition Arousal/Alertness: Awake/alert Behavior During Therapy: WFL for tasks assessed/performed Overall Cognitive Status: Impaired/Different from baseline Area of Impairment: Problem solving     Memory: Decreased short-term memory;Decreased recall of precautions       Problem Solving: Slow processing;Decreased initiation;Difficulty sequencing;Requires verbal cues General Comments: Pt with slower processing today, repeating questions that were previously answered at start of therapy session.    Exercises General Exercises - Lower Extremity Ankle Circles/Pumps: AROM;Both;10 reps;Seated Other Exercises Other Exercises: Static standing balance activity with eyes open. Needed UE support at times. Able to perform x4 min.    General Comments General comments (skin integrity, edema, etc.): Pt required a lot of review with techniques and education taught at previous session as he recalls very little of his precautions or safe mobility techniques today.      Pertinent Vitals/Pain Pain Assessment: 0-10 Pain Score: 4  Pain Location: sternum  Pain Descriptors / Indicators:  (Hurts when I cough) Pain Intervention(s): Monitored  during session;Repositioned  SpO2 94% at rest 4L supplemental O2 SpO2 90-2% while ambulating 4L supplemental O2    Home Living                      Prior Function            PT Goals (current goals can now be found in the care plan section) Acute Rehab PT Goals PT Goal Formulation: With patient Time For Goal Achievement: 01/13/14 Potential to Achieve Goals: Good Progress towards PT goals: Progressing toward goals    Frequency  Min 3X/week    PT Plan Current plan remains appropriate    Co-evaluation             End of Session Equipment Utilized During Treatment: Gait belt;Oxygen Activity Tolerance: Patient tolerated treatment well Patient left: with call bell/phone within reach;in bed;with chair alarm set     Time: 1448-1856 PT Time Calculation (min) (ACUTE ONLY): 43 min  Charges:  $Gait Training: 8-22 mins $Therapeutic Activity: 23-37 mins                    G Codes:      Ellouise Newer 01/19/14, 4:21 PM  Camille Bal [place], [place]

## 2014-01-04 NOTE — Progress Notes (Signed)
CARDIAC REHAB PHASE I   PRE:  Rate/Rhythm: 82 SR  BP:  Supine:   Sitting: 110/60  Standing:    SaO2: 96 4L  MODE:  Ambulation: 250 ft   POST:  Rate/Rhythm: 87 SR  BP:  Supine:   Sitting: 110/44  Standing:    SaO2: 88 2L 91 2L with rest 1117-1215 On arrival pt in recliner left chest tube site draining. Dressing was saturated, pt's gown and in recliner. Dressing changed. O2 sat in chair 96% on 4L, O2 decreased to 2L sat 92%. Assisted X 1 used walker and O2 2L to ambulate. Gait steady with walker. Pt able to walk 250 feet with several standing rest stops. RA sat in hall on 2L 90-91%. Pt back to recliner after walk, O2 sat on 2L 88%, with rest increased to 91%. Left pt ion 2L. He coughed up thick blood tinged sputum during walk.  Rodney Langton RN 01/04/2014 12:14 PM

## 2014-01-05 NOTE — Progress Notes (Signed)
Pt ambulated 150 ft in hallway with rolling walker, 3L O2, and RN. Pt tolerated walk well. Had some coughing during walk.

## 2014-01-05 NOTE — Progress Notes (Signed)
CARDIAC REHAB PHASE I   PRE:  Rate/Rhythm: 70 SR    BP: sitting 110/44    SaO2: 95 3L  MODE:  Ambulation: 350 ft   POST:  Rate/Rhythm: 90 SR    BP: sitting 105/90 (? Accuracy, 2nd attempt)    SaO2: 94 3L   Pt improving. Faster walk today, longer distance, x2 rest stops. Needs encouragement to walk, pt disagrees that he needs x2 more walks today. Sts he has only been getting 2 a day. To bed.  [place], [place], ACSM 01/05/2014 2:47 PM

## 2014-01-05 NOTE — Discharge Summary (Addendum)
Physician Discharge Summary       [address]       [place] [address]             [phone]    Patient ID: Alfred Hall MRN: [id number] DOB/AGE: 11-02-1929 80 y.o.  Admit date: 12/26/2013 Discharge date: 01/09/2014  Admission Diagnoses: 1. Ascending thoracic aortic aneurysm 2. Critical aortic stenosis 3. Left lung mass 4. History of hypertension 5. History of hypercholesterolemia 6. History of PVD 7. History of tobacco abuse 8. History of microhematuria (workup negative) 9. History of AAA 10. History of COPD  Discharge Diagnoses:  1. Ascending thoracic aortic aneurysm 2. Critical aortic stenosis 3. Left lung mass 4. History of hypertension 5. History of hypercholesterolemia 6. History of PVD 7. History of tobacco abuse 8. History of microhematuria (workup negative) 9. History of AAA 10. History of COPD 11. Post op atrial fibrillation (converted to sinus rhythm) 12. Elevated creatinine 13. ABL anemia  Procedure (s):  1.Supracoronary replacement of ascending aorta with Hemashield graft 32 mm, replacement of aortic valve with pericardial tissue valve 25 mm, [employer], model 3300TFX, serial [id number]; coronary artery bypass grafting with the left internal mammary to the left anterior descending coronary artery, biopsy of left lung Mass by Dr. Servando Snare on 12/26/2013. 2.Postop placement of left chest tube secondary to opacity left chest  History of Presenting Illness: This is an 80 y.o. malewho was  seen in the office for newly diagnosed lung mass,aortic stenosis,ascending aortic aneurysm, and abdominal aortic aneurysm. The patient has been a long-term smoker x60 years with underlying COPD, limited in his pulmonary reserve but not on oxygen at home. He had a known abdominal aortic aneurysm, followed at [facility], but not seen since 2013. He had a recent "checkup" at the [facility] in [place], [place]. A chest x-ray suggested a little left  upper lobe lung mass. His local provider in [place] arranged CT scan of the chest , PET scan, and echocardiogram. He is referred for further evaluation and deciding on a treatment plan.  In addition to the suspicious left upper lobe lung mass, he's recently been found to have a 5.5 cm ascending aortic aneurysm, critical aortic stenosis by echocardiogram, ejection fraction 40-45%, peak gradient 77, mean gradient 53,  aortic valve velocity 4.4 m/s, and moderate mitral stenosis. Abdominal aortic aneurysm has increased in size from 4.6 cm in 2013 to 5.2 now.  Surprisingly, the patient has few symptoms but is fairly sedentary in his lifestyle. He does care for his ill wife, does light housework grocery shopping, but does not do any yard work around his house because of shortness of breath with exertion. He denies any syncope, denies angina, denies pedal edema. His major limiting symptom is shortness of breath with exertion.  Since last seen, the patient has seen cardiology and had cardiac cath and seen vascular surgery concerning his abdominal aortic aneurysm. At time of cath, he had onset of Afib and stayed overnight.  Dr. Servando Snare discussed the need for left lung biopsy, median sternotomy for repair/resect ATAA, aortic valve replacement, and coronary artery bypass grafting surgery. Potential risks, complications, and benefits of the surgery were discussed with the patient and he agreed to proceed with surgery. He underwent the afore mentioned surgery on 12/26/2013.  Brief Hospital Course:  The patient was extubated the morning of post operative day one without difficulty. He remained afebrile and hemodynamically stable. He was weaned off of Levophed and Neo synephrine drips. [name] and  a line were removed early in his post operative course. Foley and chest tubes remained for several days and then were removed. Lopressor was started and titrated accordingly. He did have PACs and brief PAF. He  was started on Amiodarone.He  was volume over loaded and diuresed. He had thrombocytopenia. Heparin and heparin-like products were avoided.This did resolve with time and his last platelet count was up to 538,000.He also had ABL anemia. His last H and H was 9.4 and 30.6. He was weaned off the insulin drip.  The patient's HGA1C pre op was 6. PT consult was obtained to assist him with ambulation. The patient was felt surgically stable for transfer from the ICU to [facility] for further convalescence on 01/01/2013. He continues to progress with PT and cardiac rehab. He  was ambulating on 2 liters of oxygen via [place]. He was restarted on Mucomyst, given Mucinex bid for cough, and on breathing treatments PRN. He has a history of COPD and will require 2-3 liters of oxygen via [place] at discharge. He did have some difficulty swallowing. A speech pathology consult was obtained and recommendations were followed accordingly. He has been tolerating a diet and has had a bowel movement. Epicardial pacing wires and chest tube sutures will be removed prior to discharge. He is felt surgically stable for discharge to the SNF today.  We ask that the SNF please do the following:  1. Please obtain vital signs at least one time daily 2.Please weigh the patient daily. If he or she continues to gain weight or develops lower extremity edema, contact the office at [phone]. 3. Ambulate patient at least three times daily and please use sternal precautions.  Latest Vital Signs: Blood pressure 117/46, pulse 73, temperature 98.3 F (36.8 C), temperature source Oral, resp. rate 18, height 5\' 8"  (1.727 m), weight 142 lb 1.6 oz (64.456 kg), SpO2 95 %.  Physical Exam: General appearance: alert, cooperative and no distress Heart: regular rate and rhythm Lungs: mildly dim in the bases, mostly clear, no wheeze Abdomen: benign Extremities: no edema Wound: incis healing well  Discharge Condition:Stable  Recent laboratory studies:  Lab  Results  Component Value Date   WBC 13.0* 01/08/2014   HGB 9.4* 01/08/2014   HCT 30.6* 01/08/2014   MCV 91.3 01/08/2014   PLT 538* 01/08/2014   Lab Results  Component Value Date   NA 140 01/04/2014   K 4.0 01/04/2014   CL 96 01/04/2014   CO2 29 01/04/2014   CREATININE 1.38* 01/04/2014   GLUCOSE 99 01/04/2014    Diagnostic Studies: Dg Chest 2 View COMPARISON: 01/03/2014  FINDINGS: The heart size and mediastinal contours are stable. Nodular density again noted in the left upper lobe. Lungs show left lower lobe atelectasis and stable small left pleural effusion. There may be minimal pleural fluid on the right. No edema, pneumothorax or pneumomediastinum identified.  IMPRESSION: No pneumothorax. Left lower lobe atelectasis and small pleural effusions, left greater than right.   Electronically Signed  By: Aletta Edouard M.D.  On: 01/07/2014 08:33   Discharge Medications:   Medication List    STOP taking these medications        hydrochlorothiazide 25 MG tablet  Commonly known as:  HYDRODIURIL     simvastatin 80 MG tablet  Commonly known as:  ZOCOR      TAKE these medications        amiodarone 200 MG tablet  Commonly known as:  PACERONE  Take 1 tablet (200 mg total) by  mouth daily.     aspirin EC 325 MG tablet  Take 1 tablet (325 mg total) by mouth daily.     atorvastatin 20 MG tablet  Commonly known as:  LIPITOR  Take 1 tablet (20 mg total) by mouth daily at 6 PM.     CENTRUM SILVER ADULT 50+ PO  Take 1 tablet by mouth daily.     cholecalciferol 1000 UNITS tablet  Commonly known as:  VITAMIN D  Take 400 Units by mouth daily.     feeding supplement (ENSURE COMPLETE) Liqd  Take 237 mLs by mouth daily at 3 pm.     Garlic 814 MG Caps  Take 300 mg by mouth daily.     guaiFENesin 600 MG 12 hr tablet  Commonly known as:  MUCINEX  Take 1 tablet (600 mg total) by mouth 2 (two) times daily.     levalbuterol 0.63 MG/3ML nebulizer solution  Commonly  known as:  XOPENEX  Take 3 mLs (0.63 mg total) by nebulization every 3 (three) hours as needed for wheezing or shortness of breath.     metoprolol tartrate 25 MG tablet  Commonly known as:  LOPRESSOR  Take 0.5 tablets (12.5 mg total) by mouth 2 (two) times daily.     traMADol 50 MG tablet  Commonly known as:  ULTRAM  Take 1 tablet (50 mg total) by mouth every 6 (six) hours.        The patient has been discharged on:   1.Beta Blocker:  Yes [x   ]                              No   [   ]                              If No, reason:  2.Ace Inhibitor/ARB: Yes [   ]                                     No  [   x ]                                     If No, reason:Elevated creatinine  3.Statin:   Yes [  x ]                  No  [   ]                  If No, reason:  4.Ecasa:  Yes  [  x ]                  No   [   ]                  If No, reason:  Follow Up Appointments: Follow-up Information    Follow up with Sherren Mocha, MD.   Specialty:  Cardiology   Why:  Call for an appointment for 2 weeks   Contact information:   [address] [address] [phone]       Follow up with Grace Isaac, MD On 02/15/2014.   Specialty:  Cardiothoracic Surgery   Why:  PA/LAT  CXR to be taken (at [facility] which is in the same building as Dr. Everrett Coombe office) on 02/15/2014 at 2:00 ;Appointment time is at 3:00 pm   Contact information:   [address] [place] [address] [phone]       Follow up with Augustin Coupe, MD.   Specialty:  Internal Medicine   Why:  Call regarding further surveillance of HGA1C 6   Contact information:   [address] [place] [address] [phone]       Signed: Lars Pinks MPA-C 01/09/2014, 7:47 AM

## 2014-01-05 NOTE — Progress Notes (Signed)
Medicare Important Message given? YES  (If response is "NO", the following Medicare IM given date fields will be blank)  Date Medicare IM given: 01/05/14 Medicare IM given by:  Dahlia Client [employer]

## 2014-01-05 NOTE — Progress Notes (Signed)
Encouraged and asked pt to go for walk, pt stated "I am so wore out, not now", will continue to encourage movement Rickard Rhymes, RN

## 2014-01-05 NOTE — Discharge Instructions (Signed)
1. Please obtain vital signs at least one time daily 2.Please weigh the patient daily. If he or she continues to gain weight or develops lower extremity edema, contact the office at [phone]. 3. Ambulate patient at least three times daily and please use sternal precautions.  Activity: 1.May walk up steps                2.No lifting more than ten pounds for four weeks.                 3.No driving for four weeks.                4.Stop any activity that causes chest pain, shortness of breath, dizziness, sweating or excessive weakness.                5.Avoid straining.                6.Continue with your breathing exercises daily.  Diet: Diabetic diet and Low fat, Low salt diet  Wound Care: May shower.  Clean wounds with mild soap and water daily. Contact the office at [phone] if any problems arise.  Aortic Valve Replacement, Care After Refer to this sheet in the next few weeks. These instructions provide you with information on caring for yourself after your procedure. Your health care provider may also give you specific instructions. Your treatment has been planned according to current medical practices, but problems sometimes occur. Call your health care provider if you have any problems or questions after your procedure. HOME CARE INSTRUCTIONS   Take medicines only as directed by your health care provider.  If your health care provider has prescribed elastic stockings, wear them as directed.  Take frequent naps or rest often throughout the day.  Avoid lifting over 10 lbs (4.5 kg) or pushing or pulling things with your arms for 6-8 weeks or as directed by your health care provider.  Avoid driving or airplane travel for 4-6 weeks after surgery or as directed by your health care provider. If you are riding in a car for an extended period, stop every 1-2 hours to stretch your legs. Keep a record of your medicines and medical history with you when traveling.  Do not drive or  operate heavy machinery while taking pain medicine. (narcotics).  Do not cross your legs.  Do not use any tobacco products including cigarettes, chewing tobacco, or electronic cigarettes. If you need help quitting, ask your health care provider.  Do not take baths, swim, or use a hot tub until your health care provider approves. Take showers once your health care provider approves. Pat incisions dry. Do not rub incisions with a washcloth or towel.  Avoid climbing stairs and using the handrail to pull yourself up for the first 2-3 weeks after surgery.  Return to work as directed by your health care provider.  Drink enough fluid to keep your urine clear or pale yellow.  Do not strain to have a bowel movement. Eat high-fiber foods if you become constipated. You may also take a medicine to help you have a bowel movement (laxative) as directed by your health care provider.  Resume sexual activity as directed by your health care provider. Men should not use medicines for erectile dysfunction until their doctor says it isokay.  If you had a certain type of heart condition in the past, you may need to take antibiotic medicine before having dental work or surgery. Let your dentist and health care  providers know if you had one or more of the following:  Previous endocarditis.  An artificial (prosthetic) heart valve.  Congenital heart disease. SEEK MEDICAL CARE IF:  You develop a skin rash.   You experience sudden changes in your weight.  You have a fever. SEEK IMMEDIATE MEDICAL CARE IF:   You develop chest pain that is not coming from your incision.  You have drainage (pus), redness, swelling, or pain at your incision site.   You develop shortness of breath or have difficulty breathing.   You have increased bleeding from your incision site.   You develop light-headedness.  MAKE SURE YOU:   Understand these directions.  Will watch your condition.  Will get help right away  if you are not doing well or get worse. Document Released: 08/21/2004 Document Revised: 06/19/2013 Document Reviewed: 11/17/2011 [facility] Patient Information 2015 [place], [place]. This information is not intended to replace advice given to you by your health care provider. Make sure you discuss any questions you have with your health care provider.

## 2014-01-05 NOTE — Clinical Social Work Note (Signed)
CSW met with patient and contacted daughter to discuss bed offers that have been received.  Patient's daughter plans to research facilities that have given bed offers and help patient decide where to go.  Updated daughter that patient may be discharging early part of next week when patient is medically stable and discharge orders have been received.  Asked patient's daughter to have an idea on where she wants her dad to go for SNF rehab and inform CSW as soon as they have decided.  Jones Broom. [place], MSW, [place] 01/05/2014 6:14 PM

## 2014-01-05 NOTE — Plan of Care (Signed)
Problem: Phase III - Recovery through Discharge Goal: Discharge plan remains appropriate-arrangements made Outcome: Completed/Met Date Met:  01/05/14

## 2014-01-05 NOTE — Progress Notes (Addendum)
      [address]       [place] [address]             [phone]      10 Days Post-Op Procedure(s) (LRB): ASCENDING AORTIC  REPLACEMENT (N/A) INTRAOPERATIVE TRANSESOPHAGEAL ECHOCARDIOGRAM (N/A) LUNG BIOPSY (Left) CORONARY ARTERY BYPASS GRAFTING (CABG) times one using the left internal mammary artery. (N/A) AORTIC VALVE REPLACEMENT (AVR) Subjective: Feels better  Objective: Vital signs in last 24 hours: Temp:  [98 F (36.7 C)-98.4 F (36.9 C)] 98.3 F (36.8 C) (11/20 0532) Pulse Rate:  [78-82] 78 (11/20 0532) Cardiac Rhythm:  [-] Normal sinus rhythm (11/19 1945) Resp:  [18-20] 18 (11/20 0532) BP: (102-125)/(47-56) 103/56 mmHg (11/20 0532) SpO2:  [90 %-94 %] 90 % (11/20 0532) Weight:  [144 lb 8 oz (65.545 kg)] 144 lb 8 oz (65.545 kg) (11/20 0532)  Hemodynamic parameters for last 24 hours:    Intake/Output from previous day: 11/19 0701 - 11/20 0700 In: 360 [P.O.:360] Out: 925 [Urine:925] Intake/Output this shift:    General appearance: alert, cooperative and no distress Heart: regular rate and rhythm Lungs: mildly dim in the bases, mostly clear, no wheeze Abdomen: benign Extremities: no edema Wound: incis healin well  Lab Results: No results for input(s): WBC, HGB, HCT, PLT in the last 72 hours. BMET:  Recent Labs  01/04/14 0450  NA 140  K 4.0  CL 96  CO2 29  GLUCOSE 99  BUN 37*  CREATININE 1.38*  CALCIUM 8.5    PT/INR: No results for input(s): LABPROT, INR in the last 72 hours. ABG    Component Value Date/Time   PHART 7.505* 12/28/2013 1239   HCO3 27.9* 12/28/2013 1239   TCO2 29 12/28/2013 1239   ACIDBASEDEF 1.0 12/28/2013 0417   O2SAT 94.0 12/28/2013 1239   CBG (last 3)   Recent Labs  01/03/14 0620 01/03/14 1112 01/03/14 1608  GLUCAP 113* 125* 120*    Meds Scheduled Meds: . acetylcysteine  3 mL Nebulization TID  . amiodarone  200 mg Oral Q12H   Followed by  . [START ON 01/06/2014] amiodarone  200 mg Oral Daily    . antiseptic oral rinse  7 mL Mouth Rinse BID  . aspirin EC  325 mg Oral Daily  . atorvastatin  20 mg Oral q1800  . docusate sodium  200 mg Oral Daily  . furosemide  40 mg Oral Daily  . guaiFENesin  600 mg Oral BID  . levalbuterol  0.63 mg Nebulization TID  . metoprolol tartrate  12.5 mg Oral BID  . moving right along book   Does not apply Once  . pantoprazole  40 mg Oral QAC breakfast  . potassium chloride  20 mEq Oral Daily  . sodium chloride  3 mL Intravenous Q12H  . traMADol  50 mg Oral 4 times per day   Continuous Infusions:  PRN Meds:.sodium chloride, bisacodyl **OR** bisacodyl, ondansetron **OR** ondansetron (ZOFRAN) IV, oxyCODONE, sodium chloride  Xrays No results found.  Assessment/Plan: S/P Procedure(s) (LRB): ASCENDING AORTIC  REPLACEMENT (N/A) INTRAOPERATIVE TRANSESOPHAGEAL ECHOCARDIOGRAM (N/A) LUNG BIOPSY (Left) CORONARY ARTERY BYPASS GRAFTING (CABG) times one using the left internal mammary artery. (N/A) AORTIC VALVE REPLACEMENT (AVR)  1 Pulm status clinically improving 2 maint Sinus rhythm 3 no new labs 4 cont to push rehab as able, SNF [facility] next week    LOS: 10 days    GOLD,WAYNE E 01/05/2014  Poss SNF by Monday Slowly improving.

## 2014-01-06 MED ORDER — SORBITOL 70 % SOLN
30.0000 mL | Freq: Every day | Status: DC | PRN
Start: 2014-01-06 — End: 2014-01-09
  Administered 2014-01-06: 30 mL via ORAL
  Filled 2014-01-06 (×2): qty 30

## 2014-01-06 NOTE — Plan of Care (Signed)
Problem: Phase II - Intermediate Post-Op Goal: Patient advanced to Phase III: Barriers addressed Outcome: Completed/Met Date Met:  01/06/14

## 2014-01-06 NOTE — Progress Notes (Signed)
01/06/2014 0840 Pt. Declining ambulation this morning. CR to see patient. Pt. States he will ambulate after lunch.  Nehal Witting, Arville Lime

## 2014-01-06 NOTE — Progress Notes (Signed)
Pt refused to ambulate tonight, states "not this late".  Will continue to encourage patient to ambulate.  Education reinforced. Pt resting in bed with call bell in reach.  Will continue to monitor pt.

## 2014-01-06 NOTE — Progress Notes (Addendum)
      [address]       [place] [address]             [phone]      11 Days Post-Op Procedure(s) (LRB): ASCENDING AORTIC  REPLACEMENT (N/A) INTRAOPERATIVE TRANSESOPHAGEAL ECHOCARDIOGRAM (N/A) LUNG BIOPSY (Left) CORONARY ARTERY BYPASS GRAFTING (CABG) times one using the left internal mammary artery. (N/A) AORTIC VALVE REPLACEMENT (AVR) Subjective: Feels SOB at times but conts to be less so. Less productive sputum  Objective: Vital signs in last 24 hours: Temp:  [97.8 F (36.6 C)-98.7 F (37.1 C)] 98.7 F (37.1 C) (11/21 0420) Pulse Rate:  [79-86] 81 (11/21 0420) Cardiac Rhythm:  [-] Normal sinus rhythm (11/20 1930) Resp:  [18-20] 20 (11/21 0420) BP: (114-121)/(58-61) 114/58 mmHg (11/21 0420) SpO2:  [90 %-95 %] 90 % (11/21 0420) Weight:  [141 lb 5 oz (64.1 kg)] 141 lb 5 oz (64.1 kg) (11/21 0420)  Hemodynamic parameters for last 24 hours:    Intake/Output from previous day: 11/20 0701 - 11/21 0700 In: 360 [P.O.:360] Out: 1375 [Urine:1375] Intake/Output this shift:    General appearance: alert, cooperative and no distress Heart: regular rate and rhythm Lungs: mildly dim in bases, no wheeze Abdomen: benign Extremities: no edema Wound: incis healing well  Lab Results: No results for input(s): WBC, HGB, HCT, PLT in the last 72 hours. BMET:  Recent Labs  01/04/14 0450  NA 140  K 4.0  CL 96  CO2 29  GLUCOSE 99  BUN 37*  CREATININE 1.38*  CALCIUM 8.5    PT/INR: No results for input(s): LABPROT, INR in the last 72 hours. ABG    Component Value Date/Time   PHART 7.505* 12/28/2013 1239   HCO3 27.9* 12/28/2013 1239   TCO2 29 12/28/2013 1239   ACIDBASEDEF 1.0 12/28/2013 0417   O2SAT 94.0 12/28/2013 1239   CBG (last 3)   Recent Labs  01/03/14 1112 01/03/14 1608  GLUCAP 125* 120*    Meds Scheduled Meds: . acetylcysteine  3 mL Nebulization TID  . amiodarone  200 mg Oral Daily  . antiseptic oral rinse  7 mL Mouth Rinse BID  .  aspirin EC  325 mg Oral Daily  . atorvastatin  20 mg Oral q1800  . docusate sodium  200 mg Oral Daily  . furosemide  40 mg Oral Daily  . guaiFENesin  600 mg Oral BID  . levalbuterol  0.63 mg Nebulization TID  . metoprolol tartrate  12.5 mg Oral BID  . moving right along book   Does not apply Once  . pantoprazole  40 mg Oral QAC breakfast  . potassium chloride  20 mEq Oral Daily  . sodium chloride  3 mL Intravenous Q12H  . traMADol  50 mg Oral 4 times per day   Continuous Infusions:  PRN Meds:.sodium chloride, bisacodyl **OR** bisacodyl, ondansetron **OR** ondansetron (ZOFRAN) IV, oxyCODONE, sodium chloride  Xrays No results found.  Assessment/Plan: S/P Procedure(s) (LRB): ASCENDING AORTIC  REPLACEMENT (N/A) INTRAOPERATIVE TRANSESOPHAGEAL ECHOCARDIOGRAM (N/A) LUNG BIOPSY (Left) CORONARY ARTERY BYPASS GRAFTING (CABG) times one using the left internal mammary artery. (N/A) AORTIC VALVE REPLACEMENT (AVR)  1 conts with steady progress- cont current RX/TX     LOS: 11 days    GOLD,WAYNE E 01/06/2014  Check CXR tomorrow Sorbitol for constipation  patient examined and medical record reviewed,agree with above note. VAN TRIGT III,PETER 01/06/2014

## 2014-01-07 ENCOUNTER — Inpatient Hospital Stay (HOSPITAL_COMMUNITY): Payer: Medicare Other

## 2014-01-07 NOTE — Plan of Care (Signed)
Problem: Phase II - Intermediate Post-Op Goal: Maintain Hemodynamic Stability Outcome: Completed/Met Date Met:  01/07/14

## 2014-01-07 NOTE — Progress Notes (Addendum)
[address]       [place] [address]             [phone]      12 Days Post-Op Procedure(s) (LRB): ASCENDING AORTIC  REPLACEMENT (N/A) INTRAOPERATIVE TRANSESOPHAGEAL ECHOCARDIOGRAM (N/A) LUNG BIOPSY (Left) CORONARY ARTERY BYPASS GRAFTING (CABG) times one using the left internal mammary artery. (N/A) AORTIC VALVE REPLACEMENT (AVR) Subjective: Feels somewhat better , still with productive cough. Had a BM  Objective: Vital signs in last 24 hours: Temp:  [97.6 F (36.4 C)-97.9 F (36.6 C)] 97.6 F (36.4 C) (11/22 0448) Pulse Rate:  [67-83] 68 (11/22 0448) Cardiac Rhythm:  [-] Normal sinus rhythm (11/21 1945) Resp:  [18] 18 (11/22 0448) BP: (97-125)/(44-64) 125/44 mmHg (11/22 0448) SpO2:  [91 %-96 %] 91 % (11/22 0759) Weight:  [139 lb (63.05 kg)] 139 lb (63.05 kg) (11/22 0218)  Hemodynamic parameters for last 24 hours:    Intake/Output from previous day: 11/21 0701 - 11/22 0700 In: 480 [P.O.:480] Out: 1325 [Urine:1325] Intake/Output this shift:    General appearance: alert, cooperative and no distress Heart: regular rate and rhythm Lungs: clear to auscultation bilaterally Abdomen: benign Extremities: no edema Wound: incis healing well  Lab Results: No results for input(s): WBC, HGB, HCT, PLT in the last 72 hours. BMET: No results for input(s): NA, K, CL, CO2, GLUCOSE, BUN, CREATININE, CALCIUM in the last 72 hours.  PT/INR: No results for input(s): LABPROT, INR in the last 72 hours. ABG    Component Value Date/Time   PHART 7.505* 12/28/2013 1239   HCO3 27.9* 12/28/2013 1239   TCO2 29 12/28/2013 1239   ACIDBASEDEF 1.0 12/28/2013 0417   O2SAT 94.0 12/28/2013 1239   CBG (last 3)  No results for input(s): GLUCAP in the last 72 hours.  Meds Scheduled Meds: . acetylcysteine  3 mL Nebulization TID  . amiodarone  200 mg Oral Daily  . antiseptic oral rinse  7 mL Mouth Rinse BID  . aspirin EC  325 mg Oral Daily  . atorvastatin  20 mg  Oral q1800  . docusate sodium  200 mg Oral Daily  . furosemide  40 mg Oral Daily  . guaiFENesin  600 mg Oral BID  . levalbuterol  0.63 mg Nebulization TID  . metoprolol tartrate  12.5 mg Oral BID  . moving right along book   Does not apply Once  . pantoprazole  40 mg Oral QAC breakfast  . potassium chloride  20 mEq Oral Daily  . sodium chloride  3 mL Intravenous Q12H  . traMADol  50 mg Oral 4 times per day   Continuous Infusions:  PRN Meds:.sodium chloride, bisacodyl **OR** bisacodyl, ondansetron **OR** ondansetron (ZOFRAN) IV, oxyCODONE, sodium chloride, sorbitol  Xrays No results found.  Assessment/Plan: S/P Procedure(s) (LRB): ASCENDING AORTIC  REPLACEMENT (N/A) INTRAOPERATIVE TRANSESOPHAGEAL ECHOCARDIOGRAM (N/A) LUNG BIOPSY (Left) CORONARY ARTERY BYPASS GRAFTING (CABG) times one using the left internal mammary artery. (N/A) AORTIC VALVE REPLACEMENT (AVR)  1 CXR may be slightly improved LUL infilt, lungs cont to improve clinically. No fevers- will recheck WBC. Mass bx was inflammatory 2 should be ready for transfer to SNF early in week    LOS: 12 days    GOLD,WAYNE E 01/07/2014  Patient remains oxygen dependent at a low level He is receiving benefit from his nebulized therapy which should be continued at SNF Ready for transfer to SNF in 1-2 days patient examined and medical record reviewed,agree with above note. VAN TRIGT III,PETER  01/07/2014   

## 2014-01-08 LAB — CBC
HEMATOCRIT: 30.6 % — AB (ref 39.0–52.0)
Hemoglobin: 9.4 g/dL — ABNORMAL LOW (ref 13.0–17.0)
MCH: 28.1 pg (ref 26.0–34.0)
MCHC: 30.7 g/dL (ref 30.0–36.0)
MCV: 91.3 fL (ref 78.0–100.0)
Platelets: 538 10*3/uL — ABNORMAL HIGH (ref 150–400)
RBC: 3.35 MIL/uL — ABNORMAL LOW (ref 4.22–5.81)
RDW: 14.9 % (ref 11.5–15.5)
WBC: 13 10*3/uL — AB (ref 4.0–10.5)

## 2014-01-08 MED ORDER — ENSURE COMPLETE PO LIQD
237.0000 mL | Freq: Every day | ORAL | Status: DC
  Administered 2014-01-08: 237 mL via ORAL

## 2014-01-08 MED ORDER — DEXAMETHASONE SODIUM PHOSPHATE 10 MG/ML IJ SOLN
INTRAMUSCULAR | Status: AC
  Filled 2014-01-08: qty 1

## 2014-01-08 MED ORDER — ASPIRIN EC 325 MG PO TBEC: 325.0000 mg | DELAYED_RELEASE_TABLET | Freq: Every day | ORAL | Status: DC

## 2014-01-08 MED ORDER — GUAIFENESIN ER 600 MG PO TB12: 600.0000 mg | ORAL_TABLET | Freq: Two times a day (BID) | ORAL | Status: DC

## 2014-01-08 MED ORDER — TRAMADOL HCL 50 MG PO TABS: 50.0000 mg | ORAL_TABLET | Freq: Four times a day (QID) | ORAL | Status: DC

## 2014-01-08 MED ORDER — METOPROLOL TARTRATE 25 MG PO TABS: 12.5000 mg | ORAL_TABLET | Freq: Two times a day (BID) | ORAL | Status: DC

## 2014-01-08 MED ORDER — ATORVASTATIN CALCIUM 20 MG PO TABS: 20.0000 mg | ORAL_TABLET | Freq: Every day | ORAL | Status: DC

## 2014-01-08 MED ORDER — AMIODARONE HCL 200 MG PO TABS: 200.0000 mg | ORAL_TABLET | Freq: Every day | ORAL | Status: DC

## 2014-01-08 MED ORDER — LEVALBUTEROL HCL 0.63 MG/3ML IN NEBU
0.6300 mg | INHALATION_SOLUTION | RESPIRATORY_TRACT | Status: DC | PRN
  Administered 2014-01-09: 0.63 mg via RESPIRATORY_TRACT

## 2014-01-08 MED ORDER — LEVALBUTEROL HCL 0.63 MG/3ML IN NEBU: 0.6300 mg | INHALATION_SOLUTION | RESPIRATORY_TRACT | Status: DC | PRN

## 2014-01-08 NOTE — Progress Notes (Addendum)
      [address]       [place] [address]             [phone]        13 Days Post-Op Procedure(s) (LRB): ASCENDING AORTIC  REPLACEMENT (N/A) INTRAOPERATIVE TRANSESOPHAGEAL ECHOCARDIOGRAM (N/A) LUNG BIOPSY (Left) CORONARY ARTERY BYPASS GRAFTING (CABG) times one using the left internal mammary artery. (N/A) AORTIC VALVE REPLACEMENT (AVR)  Subjective: Patient eating breakfast. "Getting stronger". Still with productive cough  Objective: Vital signs in last 24 hours: Temp:  [97.3 F (36.3 C)-98.4 F (36.9 C)] 98.2 F (36.8 C) (11/23 0439) Pulse Rate:  [67-84] 72 (11/23 0439) Cardiac Rhythm:  [-] Normal sinus rhythm (11/22 1500) Resp:  [15-18] 15 (11/23 0439) BP: (102-122)/(53-67) 119/53 mmHg (11/23 0439) SpO2:  [91 %-96 %] 93 % (11/23 0439) Weight:  [142 lb 1.6 oz (64.456 kg)] 142 lb 1.6 oz (64.456 kg) (11/23 0439)  Pre op weight 64 kg Current Weight  01/08/14 142 lb 1.6 oz (64.456 kg)      Intake/Output from previous day: 11/22 0701 - 11/23 0700 In: 480 [P.O.:480] Out: 500 [Urine:500]   Physical Exam:  Cardiovascular: RRR Pulmonary: Diminished at bases Abdomen: Soft, non tender, bowel sounds present. Extremities: No lower extremity edema. Wounds: Clean and dry.  No erythema or signs of infection.  Lab Results: CBC:  Recent Labs  01/08/14 0447  WBC 13.0*  HGB 9.4*  HCT 30.6*  PLT 538*   BMET:  No results for input(s): NA, K, CL, CO2, GLUCOSE, BUN, CREATININE, CALCIUM in the last 72 hours.  PT/INR:  Lab Results  Component Value Date   INR 1.36 12/29/2013   INR 1.57* 12/28/2013   INR 1.47 12/27/2013   ABG:  INR: Will add last result for INR, ABG once components are confirmed Will add last 4 CBG results once components are confirmed  Assessment/Plan:  1. CV - Previous a fib. Maintaining SR in the 80's this. On Amiodarone 200 daily, Lopressor 12.5 bid.  2.  Pulmonary -History of COPD.Mucinex bid for coughing and Xopenex  QID.  On 2 liters of oxygen via [place]. He will likely will need oxygen at discharge.Encourage incentive spirometer and flutter valve. 3. Volume Overload - On Lasix 40 daily.  4.  Acute blood loss anemia - Last H and H stable at 9.4 and 30.6 5. Remove sutures 6. WBC slightly increased to 13,000. Remains afebrile. Has productive cough. Will discuss with Dr. Servando Snare. 7. Will need SNF when ready for discharge, hopefully soon.  ZIMMERMAN,DONIELLE MPA-C 01/08/2014,7:42 AM  Waiting for SNF, will need O2 at home and SNF No fever, mild elevated WBC, sputum clearing from previous bloody sputum related to lung BX I have seen and examined Leretha Dykes and agree with the above assessment  and plan.  Grace Isaac MD Beeper [phone] Office [phone] 01/08/2014 7:57 AM

## 2014-01-08 NOTE — Plan of Care (Signed)
Problem: Acute Rehab PT Goals(only PT should resolve) Goal: Pt Will Ambulate Outcome: Completed/Met Date Met:  01/08/14

## 2014-01-08 NOTE — Progress Notes (Signed)
CARDIAC REHAB PHASE I   PRE:  Rate/Rhythm: 82 SR  BP:  Supine:   Sitting: 100/44  Standing:    SaO2: 91 2L  MODE:  Ambulation: 380 ft   POST:  Rate/Rhythm: 94  BP:  Supine:   Sitting: 110/42  Standing:    SaO2: 89 2L 0835-0910 On arrival pt in recliner, states that he didn't sleep well last night. Pt states that he was disoriented. Pt oriented this am. Assisted X 1 used walker and O2 2L to ambulate. Gait steady with walker. Pt tires and is DOE walking. He took several standing rest stops during walk. Pt able to walk 380 feet. VS stable. Pt back to recliner after walk with call light in reach.  Rodney Langton RN 01/08/2014 9:09 AM

## 2014-01-08 NOTE — Progress Notes (Signed)
Medicare Important Message given? YES  (If response is "NO", the following Medicare IM given date fields will be blank)  Date Medicare IM given: 01/08/14 Medicare IM given by:  Dahlia Client [employer]

## 2014-01-08 NOTE — Clinical Social Work Note (Signed)
Contacted patient's daughter and discussed bed offers.  Patient and daughter would like to go to [facility] and paperwork for facility has been filled out, awaiting [facility] number.  Plan to discharge tomorrow once [facility] number received and physician provides discharge orders.  Jones Broom. [place], MSW, [place] 01/08/2014 5:02 PM

## 2014-01-08 NOTE — Progress Notes (Signed)
NUTRITION FOLLOW UP  INTERVENTION:  Recommend Speech Path consult for swallow evaluation Ensure Complete po daily, each supplement provides 220 kcal and 10 grams of protein RD to follow for nutrition care plan  NUTRITION DIAGNOSIS: Inadequate oral intake now related to poor appetite as evidenced by pt report, ongoing  Goal: Pt to meet >/= 90% of their estimated nutrition needs, progressing  Monitor:  PO & supplemental intake, weight, labs, I/O's  ASSESSMENT: 80 y.o. Male with newly diagnosed Lung mass/aortic stenosis/ascending aortic aneurysm/abdominal aortic aneurysm; presented for surgical intervention.  Patient s/p procedures 11/10: ASCENDING AORTIC REPLACEMENT  LUNG BIOPSY (Left) CORONARY ARTERY BYPASS GRAFTING  AORTIC VALVE REPLACEMENT   Patient extubated 11/12.    Transferred to 2W-Cardiac from 2S-SICU 11/22.  Pt continues on a Heart Healthy/Carbohydrate Modified.  Pt reports a continued decreased appetite.  PO intake variable at 30-75% per flowsheet records.  He also endorses trouble swallowing.  Will re-order Ensure Complete supplements for patient to try again.  Height: Ht Readings from Last 1 Encounters:  01/07/14 5\' 8"  (1.727 m)    Weight: Wt Readings from Last 1 Encounters:  01/08/14 142 lb 1.6 oz (64.456 kg)    BMI:  Body mass index is 21.61 kg/(m^2).  Estimated Nutritional Needs: Kcal: 2000-2150 Protein: 120-130 gm Fluid: per MD  Skin: chest surgical incision   Diet Order: NPO   Intake/Output Summary (Last 24 hours) at 01/08/14 1355 Last data filed at 01/08/14 0900  Gross per 24 hour  Intake    240 ml  Output    500 ml  Net   -260 ml    Labs:   Recent Labs Lab 01/02/14 0435 01/04/14 0450  NA 138 140  K 4.1 4.0  CL 97 96  CO2 28 29  BUN 33* 37*  CREATININE 1.35 1.38*  CALCIUM 8.4 8.5  GLUCOSE 96 99    CBG (last 3)  No results for input(s): GLUCAP in the last 72 hours.  Scheduled Meds: . amiodarone  200 mg Oral Daily  .  antiseptic oral rinse  7 mL Mouth Rinse BID  . aspirin EC  325 mg Oral Daily  . atorvastatin  20 mg Oral q1800  . dexamethasone      . docusate sodium  200 mg Oral Daily  . furosemide  40 mg Oral Daily  . guaiFENesin  600 mg Oral BID  . levalbuterol  0.63 mg Nebulization TID  . metoprolol tartrate  12.5 mg Oral BID  . moving right along book   Does not apply Once  . pantoprazole  40 mg Oral QAC breakfast  . potassium chloride  20 mEq Oral Daily  . sodium chloride  3 mL Intravenous Q12H  . traMADol  50 mg Oral 4 times per day    Continuous Infusions:    Past Medical History  Diagnosis Date  . Bronchogenic cancer of left lung     a. followed by multidisciplinary thoracic oncology clinic  . PVD (peripheral vascular disease)   . Microhematuria     a. workup negative  . Aortic aneurysm, abdominal   . Hypercholesteremia   . Hypertension   . Thoracic aortic aneurysm without rupture   . Aortic stenosis     a. ECHO at [place]- pk AV grad 77 with a mean rate of 53   . Tobacco abuse   . CAD (coronary artery disease)     a. s/p LHC on 09/18/13 with non-obs disease and severe AS  . COPD (chronic obstructive pulmonary  disease)   . Hypoglycemia   . Abdominal aortic aneurysm     Past Surgical History  Procedure Laterality Date  . Cataract extraction    . Eye surgery      CATARACT  . Throat nodule excision    . Nose surgery    . Cardiac catheterization    . Tonsillectomy    . Appendectomy    . Ascending aortic root replacement N/A 12/26/2013    Procedure: ASCENDING AORTIC  REPLACEMENT;  Surgeon: Grace Isaac, MD;  Location: [facility];  Service: Open Heart Surgery;  Laterality: N/A;  . Intraoperative transesophageal echocardiogram N/A 12/26/2013    Procedure: INTRAOPERATIVE TRANSESOPHAGEAL ECHOCARDIOGRAM;  Surgeon: Grace Isaac, MD;  Location: [facility];  Service: Open Heart Surgery;  Laterality: N/A;  . Lung biopsy Left 12/26/2013    Procedure: LUNG BIOPSY;  Surgeon: Grace Isaac, MD;  Location: [facility];  Service: Open Heart Surgery;  Laterality: Left;  . Coronary artery bypass graft N/A 12/26/2013    Procedure: CORONARY ARTERY BYPASS GRAFTING (CABG) times one using the left internal mammary artery.;  Surgeon: Grace Isaac, MD;  Location: [facility];  Service: Open Heart Surgery;  Laterality: N/A;  . Aortic valve replacement  12/26/2013    Procedure: AORTIC VALVE REPLACEMENT (AVR);  Surgeon: Grace Isaac, MD;  Location: [place];  Service: Open Heart Surgery;;    Arthur Holms, RD, LDN Pager #: [phone] After-Hours Pager #: [phone]

## 2014-01-08 NOTE — Progress Notes (Signed)
Physical Therapy Treatment Patient Details Name: Alfred Hall MRN: [id number] DOB: 06-10-1929 Today's Date: 01/08/2014    History of Present Illness Pt with severe aortics stenosis and left lung mass s/p CABGx 1, AVR, ascending aorta replacement, and lung biopsy    PT Comments    Patient progressing towards physical therapy goals. SpO2 down to 86% at lowest while ambulating on 3L supplemental O2; returns to 92% with standing rest break and pursed lip breathing. Requires frequent reminders for sternal precautions, despite multiple attempts to educate during each therapy session. Patient will continue to benefit from skilled physical therapy services to further improve independence with functional mobility.   Follow Up Recommendations  SNF;Supervision for mobility/OOB     Equipment Recommendations  Rolling walker with 5" wheels    Recommendations for Other Services       Precautions / Restrictions Precautions Precautions: Sternal;Fall Precaution Comments: monitor O2 sats Restrictions Weight Bearing Restrictions: Yes Other Position/Activity Restrictions: Sternal precautions    Mobility  Bed Mobility                  Transfers Overall transfer level: Needs assistance Equipment used: Rolling walker (2 wheeled) Transfers: Sit to/from Stand Sit to Stand: Min assist         General transfer comment: Min assist for boost. Pt rocks several times for momentum prior to standing.  Performed from reclining chair. Max VC to remind pt of sternal precautions.  Ambulation/Gait Ambulation/Gait assistance: Min guard Ambulation Distance (Feet): 160 Feet (Required multiple standing rest breaks.) Assistive device: Rolling walker (2 wheeled) Gait Pattern/deviations: Step-through pattern;Decreased stride length;Shuffle;Narrow base of support   Gait velocity interpretation: Below normal speed for age/gender General Gait Details: VC for larger steps intermittently. Required  multiple standing rest breaks to complete distance due to fatigue and SOB. Min guard throughout for safety, pt mildly unstable with RW today but did not require assist to correct stability. SpO2 86% at one point but returned to 92% with standing rest break and pursed lip breathing (3L supplemental O2.)   Stairs            Wheelchair Mobility    Modified Rankin (Stroke Patients Only)       Balance                                    Cognition Arousal/Alertness: Awake/alert Behavior During Therapy: WFL for tasks assessed/performed Overall Cognitive Status: Impaired/Different from baseline Area of Impairment: Problem solving     Memory: Decreased short-term memory;Decreased recall of precautions       Problem Solving: Slow processing      Exercises General Exercises - Lower Extremity Ankle Circles/Pumps: AROM;Both;10 reps;Seated Quad Sets: Strengthening;Both;10 reps;Seated Long Arc Quad: Strengthening;Both;10 reps;Seated Hip Flexion/Marching: Strengthening;Both;10 reps;Seated    General Comments General comments (skin integrity, edema, etc.): Continues to require reinforcement of sternal precautions. Reviewed handout with patient again.      Pertinent Vitals/Pain Pain Assessment: No/denies pain Pain Intervention(s): Monitored during session  SpO2 86% on 3L supplemental O2 while ambulating  Avg 91% during ambulation on 3L supplemental O2 while ambulating  SpO2 96% at rest on 3L supplemental O2    Home Living                      Prior Function            PT Goals (current goals can now be  found in the care plan section) Acute Rehab PT Goals PT Goal Formulation: With patient Time For Goal Achievement: 01/13/14 Potential to Achieve Goals: Good Progress towards PT goals: Progressing toward goals    Frequency  Min 3X/week    PT Plan Current plan remains appropriate    Co-evaluation             End of Session Equipment  Utilized During Treatment: Gait belt;Oxygen Activity Tolerance: Patient tolerated treatment well Patient left: with call bell/phone within reach;in chair     Time: 1497-0263 PT Time Calculation (min) (ACUTE ONLY): 27 min  Charges:  $Gait Training: 8-22 mins $Therapeutic Exercise: 8-22 mins                    G Codes:      Ellouise Newer 01/26/2014, 1:57 PM  Camille Bal [place], [place]

## 2014-01-09 MED ORDER — ENSURE COMPLETE PO LIQD: 237.0000 mL | Freq: Every day | ORAL | Status: DC

## 2014-01-09 NOTE — Clinical Social Work Note (Signed)
Patient to be d/c'ed today to [facility].  Patient and family agreeable to plans will transport via ems RN to call report.  Patient and family stated they have been happy with the care that was provided and did not have any complaints.  Evette Cristal, MSW, [place]

## 2014-01-09 NOTE — Progress Notes (Signed)
[id number] Cardiac Rehab Completed discharge education with pt. He voices understanding. We discussed smoking cessation. Pt states that he has quit. I gave him tips for quitting, coaching contact number and quit smart class information. Pt seems very committed to quitting. He agrees to [employer]. CRP in [facility], will send referral. I placed recovery from heart surgery video on for him to watch. Deon Pilling, RN 01/09/2014 10:06 AM

## 2014-01-09 NOTE — Progress Notes (Signed)
Report given to Wilber Bihari (an LPN) in [facility]. Awaiting on [facility]AR for pick up at 1330.

## 2014-01-09 NOTE — Progress Notes (Signed)
      [address]       [place] [address]             [phone]        14 Days Post-Op Procedure(s) (LRB): ASCENDING AORTIC  REPLACEMENT (N/A) INTRAOPERATIVE TRANSESOPHAGEAL ECHOCARDIOGRAM (N/A) LUNG BIOPSY (Left) CORONARY ARTERY BYPASS GRAFTING (CABG) times one using the left internal mammary artery. (N/A) AORTIC VALVE REPLACEMENT (AVR)  Subjective: Patient receiving breathing treatment this am. No specific complaints.  Objective: Vital signs in last 24 hours: Temp:  [98.3 F (36.8 C)-98.6 F (37 C)] 98.3 F (36.8 C) (11/24 0530) Pulse Rate:  [73-77] 73 (11/24 0530) Cardiac Rhythm:  [-] Normal sinus rhythm (11/23 1909) Resp:  [18-19] 18 (11/24 0530) BP: (98-117)/(46-57) 117/46 mmHg (11/24 0530) SpO2:  [88 %-96 %] 95 % (11/24 0530)  Pre op weight 64 kg Current Weight  01/08/14 142 lb 1.6 oz (64.456 kg)      Intake/Output from previous day: 11/23 0701 - 11/24 0700 In: 480 [P.O.:480] Out: 580 [Urine:580]   Physical Exam:  Cardiovascular: RRR Pulmonary: Diminished at bases Abdomen: Soft, non tender, bowel sounds present. Extremities: No lower extremity edema. Wounds: Clean and dry.  No erythema or signs of infection.  Lab Results: CBC:  Recent Labs  01/08/14 0447  WBC 13.0*  HGB 9.4*  HCT 30.6*  PLT 538*   BMET:  No results for input(s): NA, K, CL, CO2, GLUCOSE, BUN, CREATININE, CALCIUM in the last 72 hours.  PT/INR:  Lab Results  Component Value Date   INR 1.36 12/29/2013   INR 1.57* 12/28/2013   INR 1.47 12/27/2013   ABG:  INR: Will add last result for INR, ABG once components are confirmed Will add last 4 CBG results once components are confirmed  Assessment/Plan:  1. CV - Previous a fib. Maintaining SR in the 80's this. On Amiodarone 200 daily, Lopressor 12.5 bid.  2.  Pulmonary -History of COPD.Mucinex bid for coughing and Xopenex QID.  On 3 liters of oxygen via [place]. He will  will need oxygen at discharge.Encourage  incentive spirometer and flutter valve. 3. Volume Overload - On Lasix 40 daily.  4.  Acute blood loss anemia - Last H and H stable at 9.4 and 30.6 5. Will need SNF when ready for discharge, hopefully soon.  Kamaria Lucia MPA-C 01/09/2014,7:28 AM

## 2014-01-24 ENCOUNTER — Encounter: Payer: Self-pay | Admitting: Physician Assistant

## 2014-01-24 ENCOUNTER — Ambulatory Visit (INDEPENDENT_AMBULATORY_CARE_PROVIDER_SITE_OTHER): Payer: Medicare Other | Admitting: Physician Assistant

## 2014-01-24 VITALS — BP 140/70 | HR 66 | Ht 68.0 in | Wt 137.0 lb

## 2014-01-24 DIAGNOSIS — N189 Chronic kidney disease, unspecified: Secondary | ICD-10-CM

## 2014-01-24 DIAGNOSIS — Z9889 Other specified postprocedural states: Secondary | ICD-10-CM

## 2014-01-24 DIAGNOSIS — I10 Essential (primary) hypertension: Secondary | ICD-10-CM

## 2014-01-24 DIAGNOSIS — N289 Disorder of kidney and ureter, unspecified: Secondary | ICD-10-CM

## 2014-01-24 DIAGNOSIS — Z954 Presence of other heart-valve replacement: Secondary | ICD-10-CM

## 2014-01-24 DIAGNOSIS — J449 Chronic obstructive pulmonary disease, unspecified: Secondary | ICD-10-CM | POA: Insufficient documentation

## 2014-01-24 DIAGNOSIS — Z8679 Personal history of other diseases of the circulatory system: Secondary | ICD-10-CM

## 2014-01-24 DIAGNOSIS — I25810 Atherosclerosis of coronary artery bypass graft(s) without angina pectoris: Secondary | ICD-10-CM

## 2014-01-24 DIAGNOSIS — Z952 Presence of prosthetic heart valve: Secondary | ICD-10-CM

## 2014-01-24 LAB — BASIC METABOLIC PANEL
BUN: 20 mg/dL (ref 6–23)
CALCIUM: 8.7 mg/dL (ref 8.4–10.5)
CO2: 32 mEq/L (ref 19–32)
CREATININE: 1.4 mg/dL (ref 0.4–1.5)
Chloride: 91 mEq/L — ABNORMAL LOW (ref 96–112)
GFR: 52.32 mL/min — ABNORMAL LOW (ref >60.00)
GLUCOSE: 96 mg/dL (ref 70–99)
Potassium: 4.3 mEq/L (ref 3.5–5.1)
Sodium: 130 mEq/L — ABNORMAL LOW (ref 135–145)

## 2014-01-24 LAB — FUNGUS CULTURE W SMEAR: Fungal Smear: NONE SEEN

## 2014-01-24 NOTE — Progress Notes (Signed)
HPI: This is an 80 year old male patient of Dr. Acie Fredrickson who underwent left lung biopsy, median sternotomy for hemi-shield graft of the ascending aorta, , aortic valve replacement with pericardial tissue valve, and CABG with a LIMA to the LAD on 12/26/13. He had postop atrial fibrillation converted to normal sinus rhythm. He has hypertension, hyperlipidemia, PVD, elevated creatinine.  The patient has been in the nursing home doing rehabilitation. He is feeling quite well and able to walk with a walker and a cane without difficulty. He is hoping to go home next Monday. He is on oxygen at 2 L 24 hours a day but never required this before. He would like to try to stop this. He states his lung biopsy was negative. He denies any chest pain, palpitations, dyspnea, dyspnea on exertion, dizziness, or presyncope.  No Known Allergies   Current Outpatient Prescriptions  Medication Sig Dispense Refill  . amiodarone (PACERONE) 200 MG tablet Take 1 tablet (200 mg total) by mouth daily. 30 tablet 1  . aspirin EC 325 MG tablet Take 1 tablet (325 mg total) by mouth daily.    Marland Kitchen atorvastatin (LIPITOR) 20 MG tablet Take 1 tablet (20 mg total) by mouth daily at 6 PM. 30 tablet 1  . CefTRIAXone Sodium (ROCEPHIN IJ) Inject 1 vial as directed. QD X'S 7 DAYS    . cholecalciferol (VITAMIN D) 1000 UNITS tablet Take 400 Units by mouth daily.    . Garlic 893 MG CAPS Take 300 mg by mouth daily.     Marland Kitchen guaiFENesin (MUCINEX) 600 MG 12 hr tablet Take 1 tablet (600 mg total) by mouth 2 (two) times daily.    . metoprolol tartrate (LOPRESSOR) 25 MG tablet Take 0.5 tablets (12.5 mg total) by mouth 2 (two) times daily. 30 tablet 1  . Multiple Vitamins-Minerals (CENTRUM SILVER ADULT 50+ PO) Take 1 tablet by mouth daily.    . traMADol (ULTRAM) 50 MG tablet Take 1 tablet (50 mg total) by mouth every 6 (six) hours. 30 tablet 0  . feeding supplement, ENSURE COMPLETE, (ENSURE COMPLETE) LIQD Take 237 mLs by mouth daily at 3 pm.  (Patient not taking: Reported on 01/24/2014)    . levalbuterol (XOPENEX) 0.63 MG/3ML nebulizer solution Take 3 mLs (0.63 mg total) by nebulization every 3 (three) hours as needed for wheezing or shortness of breath. (Patient not taking: Reported on 01/24/2014) 3 mL 12   No current facility-administered medications for this visit.    Past Medical History  Diagnosis Date  . Bronchogenic cancer of left lung     a. followed by multidisciplinary thoracic oncology clinic  . PVD (peripheral vascular disease)   . Microhematuria     a. workup negative  . Aortic aneurysm, abdominal   . Hypercholesteremia   . Hypertension   . Thoracic aortic aneurysm without rupture   . Aortic stenosis     a. ECHO at [place]- pk AV grad 77 with a mean rate of 53   . Tobacco abuse   . CAD (coronary artery disease)     a. s/p LHC on 09/18/13 with non-obs disease and severe AS  . COPD (chronic obstructive pulmonary disease)   . Hypoglycemia   . Abdominal aortic aneurysm     Past Surgical History  Procedure Laterality Date  . Cataract extraction    . Eye surgery      CATARACT  . Throat nodule excision    . Nose surgery    . Cardiac catheterization    .  Tonsillectomy    . Appendectomy    . Ascending aortic root replacement N/A 12/26/2013    Procedure: ASCENDING AORTIC  REPLACEMENT;  Surgeon: Grace Isaac, MD;  Location: [facility];  Service: Open Heart Surgery;  Laterality: N/A;  . Intraoperative transesophageal echocardiogram N/A 12/26/2013    Procedure: INTRAOPERATIVE TRANSESOPHAGEAL ECHOCARDIOGRAM;  Surgeon: Grace Isaac, MD;  Location: [facility];  Service: Open Heart Surgery;  Laterality: N/A;  . Lung biopsy Left 12/26/2013    Procedure: LUNG BIOPSY;  Surgeon: Grace Isaac, MD;  Location: [facility];  Service: Open Heart Surgery;  Laterality: Left;  . Coronary artery bypass graft N/A 12/26/2013    Procedure: CORONARY ARTERY BYPASS GRAFTING (CABG) times one using the left internal mammary artery.;  Surgeon:  Grace Isaac, MD;  Location: [facility];  Service: Open Heart Surgery;  Laterality: N/A;  . Aortic valve replacement  12/26/2013    Procedure: AORTIC VALVE REPLACEMENT (AVR);  Surgeon: Grace Isaac, MD;  [facility];  Service: Open Heart Surgery;;    Family History  Problem Relation Age of Onset  . Renal Disease Father   . Congestive Heart Failure Mother     History   Social History  . Marital Status: Married    Spouse Name: N/A    Number of Children: 3  . Years of Education: N/A   Occupational History  . retired from [employer]  . Smoking status: Former Smoker -- 0.50 packs/day for 60 years    Types: Cigarettes    Quit date: 12/22/2013  . Smokeless tobacco: Never Used  . Alcohol Use: Yes     Comment: " occasional beer"  . Drug Use: No  . Sexual Activity: Not on file   Other Topics Concern  . Not on file   Social History Narrative    ROS: Slowly rehabilitating otherwise see history of present illness  BP 140/70 mmHg  Pulse 66  Ht 5\' 8"  (1.727 m)  Wt 137 lb (62.143 kg)  BMI 20.84 kg/m2  PHYSICAL EXAM: Thin, elderly, in no acute distress. Neck: No JVD, HJR, Bruit, or thyroid enlargement  Lungs: Decreased breath sounds at left base with fine crackles otherwise No tachypnea, clear without wheezing, rales, or rhonchi  Cardiovascular: Incisions healing well, RRR, PMI not displaced, Normal S1 and S2, no murmurs, gallops, bruit, thrill, or heave.  Abdomen: BS normal. Soft without organomegaly, masses, lesions or tenderness.  Extremities: without cyanosis, clubbing or edema. Good distal pulses bilateral  SKin: Warm, no lesions or rashes   Musculoskeletal: No deformities  Neuro: no focal signs   Wt Readings from Last 3 Encounters:  01/24/14 137 lb (62.143 kg)  01/08/14 142 lb 1.6 oz (64.456 kg)  12/22/13 147 lb 12.8 oz (67.042 kg)    Lab Results  Component Value Date   WBC 13.0* 01/08/2014   HGB 9.4* 01/08/2014     HCT 30.6* 01/08/2014   PLT 538* 01/08/2014   GLUCOSE 99 01/04/2014   ALT 11 12/28/2013   AST 35 12/28/2013   NA 140 01/04/2014   K 4.0 01/04/2014   CL 96 01/04/2014   CREATININE 1.38* 01/04/2014   BUN 37* 01/04/2014   CO2 29 01/04/2014   INR 1.36 12/29/2013   HGBA1C 6.0* 12/22/2013    EKG: Normal sinus rhythm with nonspecific ST-T wave changes, no acute change 2-D echo 11/28/13 Study Conclusions  - Left ventricle: The cavity size was normal. Wall thickness was  increased in a pattern of mild LVH. Systolic function was mildly   reduced. The estimated ejection fraction was in the range of 45%   to 50%. Diffuse hypokinesis. There is severe hypokinesis of the   inferior myocardium. Doppler parameters are consistent with   abnormal left ventricular relaxation (grade 1 diastolic   dysfunction). - Aortic valve: Valve mobility was restricted. There was severe   stenosis. Valve area (Vmax): 0.25 cm^2. - Aorta: Aortic root dimension: 44 mm (ED). - Aortic root: The aortic root was mildly dilated. - Atrial septum: There was an atrial septal aneurysm.  Impressions:  - Global hypokinesis most prominent in the inferior wall; overall   EF 45; grade 1 diastolic dysfunction; heavily calcified aortic   valve with reduced cusp excursion; severe AS with mean gradient   of 56 mmHg; dilated aortic root (44 mm).

## 2014-01-24 NOTE — Assessment & Plan Note (Signed)
Doing well status post CABG, aVR, aneurysm repair and lung biopsy. Patient looks well and is open to return home on Monday. Follow-up with Dr.Nahser 02/14/14.

## 2014-01-24 NOTE — Assessment & Plan Note (Signed)
Follow-up labs today

## 2014-01-24 NOTE — Assessment & Plan Note (Signed)
BP stable.

## 2014-01-24 NOTE — Assessment & Plan Note (Signed)
COPD on O2 at 2 L. Patient does not feel like he needs this. We'll have them check his O2 sats while doing physical therapy at the nursing home.

## 2014-01-24 NOTE — Patient Instructions (Signed)
Your physician recommends that you continue on your current medications as directed. Please refer to the Current Medication list given to you     Your physician recommends that you return for lab work in:  Today BMET

## 2014-01-25 ENCOUNTER — Encounter (HOSPITAL_COMMUNITY): Payer: Self-pay | Admitting: Cardiovascular Disease

## 2014-02-06 ENCOUNTER — Telehealth: Payer: Self-pay | Admitting: *Deleted

## 2014-02-06 NOTE — Telephone Encounter (Signed)
-----   Message from Imogene Burn, PA-C sent at 01/29/2014  8:09 AM EST ----- Labs stable

## 2014-02-07 LAB — AFB CULTURE WITH SMEAR (NOT AT ARMC): Acid Fast Smear: NONE SEEN

## 2014-02-14 ENCOUNTER — Ambulatory Visit: Payer: Medicare Other | Admitting: Cardiovascular Disease

## 2014-02-15 ENCOUNTER — Ambulatory Visit: Payer: Medicare Other | Admitting: Cardiothoracic Surgery

## 2014-02-22 ENCOUNTER — Ambulatory Visit: Payer: Medicare Other | Admitting: Cardiothoracic Surgery

## 2014-02-27 ENCOUNTER — Encounter: Payer: Self-pay | Admitting: Cardiothoracic Surgery

## 2014-02-27 ENCOUNTER — Ambulatory Visit
Admission: RE | Admit: 2014-02-27 | Discharge: 2014-02-27 | Disposition: A | Discharge status: Home / Self Care | Payer: Medicare Other | Source: Ambulatory Visit | Attending: Cardiothoracic Surgery | Admitting: Cardiothoracic Surgery

## 2014-02-27 ENCOUNTER — Other Ambulatory Visit: Payer: Self-pay | Admitting: Cardiothoracic Surgery

## 2014-02-27 ENCOUNTER — Ambulatory Visit (INDEPENDENT_AMBULATORY_CARE_PROVIDER_SITE_OTHER): Payer: Medicare Other | Admitting: Cardiothoracic Surgery

## 2014-02-27 VITALS — BP 134/68 | HR 72 | Resp 20 | Ht 68.0 in | Wt 137.0 lb

## 2014-02-27 DIAGNOSIS — Z954 Presence of other heart-valve replacement: Secondary | ICD-10-CM

## 2014-02-27 DIAGNOSIS — I7121 Aneurysm of the ascending aorta, without rupture: Secondary | ICD-10-CM

## 2014-02-27 DIAGNOSIS — I350 Nonrheumatic aortic (valve) stenosis: Secondary | ICD-10-CM

## 2014-02-27 DIAGNOSIS — I712 Thoracic aortic aneurysm, without rupture: Secondary | ICD-10-CM

## 2014-02-27 DIAGNOSIS — R918 Other nonspecific abnormal finding of lung field: Secondary | ICD-10-CM

## 2014-02-27 DIAGNOSIS — Z952 Presence of prosthetic heart valve: Secondary | ICD-10-CM

## 2014-02-27 DIAGNOSIS — Z951 Presence of aortocoronary bypass graft: Secondary | ICD-10-CM

## 2014-02-27 DIAGNOSIS — Z9889 Other specified postprocedural states: Secondary | ICD-10-CM

## 2014-02-27 DIAGNOSIS — Z95828 Presence of other vascular implants and grafts: Secondary | ICD-10-CM

## 2014-02-27 NOTE — Progress Notes (Signed)
[address]       [place] [address]             [phone]      Genie P Tool [facility] Medical Record #[id number] Date of Birth: March 21, 1929  Referring: Blane Ohara, MD Primary Care: Augustin Coupe, MD  Chief Complaint:   POST OP FOLLOW UP 12/26/2013  OPERATIVE REPORT PREOPERATIVE DIAGNOSES: Ascending aortic aneurysm, severe critical aortic stenosis, coronary artery disease, left lung mass. POSTOPERATIVE DIAGNOSES: Ascending aortic aneurysm, severe critical aortic stenosis, coronary artery disease, left lung mass. SURGICAL PROCEDURES: Supracoronary replacement of ascending aorta with Hemashield graft 32 mm, replacement of aortic valve with pericardial tissue valve 25 mm, [employer], model 3300TFX, serial [id number]; coronary artery bypass grafting with the left internal mammary to the left anterior descending coronary artery, biopsy of left lung mass. SURGEON: Lanelle Bal, MD Path: Diagnosis 1. Lung, biopsy, Left upper lobe - FIBROSIS AND INFLAMMATION. - NO MALIGNANCY IDENTIFIED. 2. Aneurysm, aortic, Ascending aortic - LARGE VESSEL WALL WITH MYXOID DEGENERATION. 3. Heart valve leaflets, Aortic valve - VALVE TISSUE WITH NODULAR CALCIFICATIONS. Vicente Males MD Pathologist, Electronic Signature  History of Present Illness:     Patient returns after recent surgery. He is making steady progress. Now not needing O2 and increasing activity.     Past Medical History  Diagnosis Date  . Bronchogenic cancer of left lung     a. followed by multidisciplinary thoracic oncology clinic  . PVD (peripheral vascular disease)   . Microhematuria     a. workup negative  . Aortic aneurysm, abdominal   . Hypercholesteremia   . Hypertension   . Thoracic aortic aneurysm without rupture   . Aortic stenosis     a. ECHO at [place]- pk AV grad 77 with a mean rate of 53   . Tobacco abuse   . CAD (coronary artery disease)     a. s/p LHC  on 09/18/13 with non-obs disease and severe AS  . COPD (chronic obstructive pulmonary disease)   . Hypoglycemia   . Abdominal aortic aneurysm      History  Smoking status  . Former Smoker -- 0.50 packs/day for 60 years  . Types: Cigarettes  . Quit date: 12/22/2013  Smokeless tobacco  . Never Used    History  Alcohol Use  . Yes    Comment: " occasional beer"     No Known Allergies  Current Outpatient Prescriptions  Medication Sig Dispense Refill  . amiodarone (PACERONE) 200 MG tablet Take 1 tablet (200 mg total) by mouth daily. 30 tablet 1  . aspirin EC 325 MG tablet Take 1 tablet (325 mg total) by mouth daily.    Marland Kitchen atorvastatin (LIPITOR) 20 MG tablet Take 1 tablet (20 mg total) by mouth daily at 6 PM. 30 tablet 1  . cholecalciferol (VITAMIN D) 1000 UNITS tablet Take 400 Units by mouth daily.    . Garlic 315 MG CAPS Take 300 mg by mouth daily.     . metoprolol tartrate (LOPRESSOR) 25 MG tablet Take 0.5 tablets (12.5 mg total) by mouth 2 (two) times daily. 30 tablet 1  . Multiple Vitamins-Minerals (CENTRUM SILVER ADULT 50+ PO) Take 1 tablet by mouth daily.     No current facility-administered medications for this visit.       Physical Exam: BP 134/68 mmHg  Pulse 72  Resp 20  Ht 5\' 8"  (1.727 m)  Wt 137 lb (62.143 kg)  BMI 20.84  kg/m2  SpO2 97%  General appearance: alert and cooperative Neurologic: intact Heart: regular rate and rhythm, S1, S2 normal, no murmur, click, rub or gallop Lungs: clear to auscultation bilaterally Abdomen: soft, non-tender; bowel sounds normal;  non tender palpable AAA  no organomegaly Extremities: extremities normal, atraumatic, no cyanosis or edema and Homans sign is negative, no sign of DVT Wound: well healed sternum  Diagnostic Studies & Laboratory data:     Recent Radiology Findings:   Dg Chest 2 View  02/27/2014   CLINICAL DATA:  Severe aortic stenosis  EXAM: CHEST  2 VIEW  COMPARISON:  PA and lateral chest x-ray of January 07, 2014  FINDINGS: The right lung is hyperinflated but clear. On the left there has been interval growth in the soft tissue mass in the left suprahilar region. There remains a small left pleural effusion versus pleural thickening. The cardiac silhouette is normal in size. A prosthetic aortic valve is present. There are 7 intact sternal wires. The pulmonary vascularity is not engorged. There is mild tortuosity of the descending thoracic aorta. The bony thorax exhibits no acute abnormality.  IMPRESSION: COPD. There is no evidence of CHF. There is an abnormal mass in the left upper lobe which has increased in size since the previous study. Reportedly the patient underwent previous negative biopsy of this mass in August of 2015. Further evaluation with repeat chest CT scanning is recommended.   Electronically Signed   By: David  Martinique   On: 02/27/2014 13:53      Recent Lab Findings: Lab Results  Component Value Date   WBC 13.0* 01/08/2014   HGB 9.4* 01/08/2014   HCT 30.6* 01/08/2014   PLT 538* 01/08/2014   GLUCOSE 96 01/24/2014   ALT 11 12/28/2013   AST 35 12/28/2013   NA 130* 01/24/2014   K 4.3 01/24/2014   CL 91* 01/24/2014   CREATININE 1.4 01/24/2014   BUN 20 01/24/2014   CO2 32 01/24/2014   INR 1.36 12/29/2013   HGBA1C 6.0* 12/22/2013      Assessment / Plan:   Doing well following CABG, AVR and replacement of acending aorta Referred back to Dr Trula Slade for evaluation and treatment of AAA- to get ct of chest for follow up of lung mass (bx negative for maligancy x2 but still sudpecious) See me in 2 months refer back to cardiology- currently no follow appointment  Grace Isaac MD      [address] [place] [address] Office [phone]   Beeper [phone]  02/27/2014 9:42 PM

## 2014-03-01 ENCOUNTER — Other Ambulatory Visit: Payer: Self-pay | Admitting: *Deleted

## 2014-03-01 ENCOUNTER — Ambulatory Visit: Payer: Medicare Other | Admitting: Cardiothoracic Surgery

## 2014-03-01 DIAGNOSIS — I716 Thoracoabdominal aortic aneurysm, without rupture, unspecified: Secondary | ICD-10-CM

## 2014-03-02 ENCOUNTER — Telehealth: Payer: Self-pay | Admitting: Cardiovascular Disease

## 2014-03-02 NOTE — Telephone Encounter (Signed)
New message      Home health want to recommend cardiac rehab for the pt.  He is doing great.

## 2014-03-03 NOTE — Telephone Encounter (Signed)
Please refer to cardiac rehab.

## 2014-03-06 MED ORDER — ATORVASTATIN CALCIUM 20 MG PO TABS: 20.0000 mg | ORAL_TABLET | Freq: Every day | ORAL | Status: DC

## 2014-03-06 MED ORDER — AMIODARONE HCL 200 MG PO TABS: 200.0000 mg | ORAL_TABLET | Freq: Every day | ORAL | Status: DC

## 2014-03-06 MED ORDER — METOPROLOL TARTRATE 25 MG PO TABS
12.5000 mg | ORAL_TABLET | Freq: Two times a day (BID) | ORAL | Status: DC
Start: 2014-03-06 — End: 2014-04-06

## 2014-03-06 NOTE — Telephone Encounter (Signed)
Called Lori with [facility] who states patient has been discharged from their service.  She advised that I contact the patient for referral to Cardiac Rehab at [facility], [place], [place] I called and spoke with patient to advise him that Dr. Acie Fredrickson is in agreement to sign orders for cardiac rehab.  Patient advised that I need to call to get rehab set up.  I advised that either someone from [facility] or I will call him back regarding scheduling for cardiac rehab after I have contacted them.  Patient would like to know if he will continue current dosages of medications from discharge; states he usually gets his long-term prescriptions filled at [facility] and would like to get a 1 month supply until he knows that these will be his long term dosages.  I scheduled patient for f/u with Dr. Acie Fredrickson in March, following his appointment with Dr. Servando Snare for s/p CABG/AVR follow-up.  Patient verbalized understanding and agreement with plan of care.

## 2014-03-06 NOTE — Telephone Encounter (Signed)
I called and spoke with Kennyth Lose in Cardiac Rehab at [facility].  She advised that she will fax paperwork for Dr. Elmarie Shiley signature and once it is returned, she will contact insurance company.

## 2014-03-08 NOTE — Telephone Encounter (Signed)
I called earlier today and advised Cardiac Rehab staff that I have not received the paperwork and she advised that she will fax a copy today

## 2014-03-15 NOTE — Telephone Encounter (Signed)
Spoke with Kennyth Lose in Cardiac Rehab at [facility] and requested another form as I never received fax last week.  Kennyth Lose verified both office fax numbers and says she will fax now.  I thanked her for her help

## 2014-03-21 NOTE — Telephone Encounter (Signed)
Signed Cardiac rehab order placed in HIM for faxing

## 2014-03-23 ENCOUNTER — Encounter: Payer: Self-pay | Admitting: Surgery

## 2014-03-26 ENCOUNTER — Ambulatory Visit
Admission: RE | Admit: 2014-03-26 | Discharge: 2014-03-26 | Disposition: A | Discharge status: Home / Self Care | Payer: Medicare Other | Source: Ambulatory Visit | Attending: Surgery | Admitting: Surgery

## 2014-03-26 ENCOUNTER — Ambulatory Visit (INDEPENDENT_AMBULATORY_CARE_PROVIDER_SITE_OTHER): Payer: Medicare Other | Admitting: Surgery

## 2014-03-26 ENCOUNTER — Encounter: Payer: Self-pay | Admitting: Surgery

## 2014-03-26 VITALS — BP 138/58 | HR 73 | Ht 68.0 in | Wt 150.0 lb

## 2014-03-26 DIAGNOSIS — I714 Abdominal aortic aneurysm, without rupture, unspecified: Secondary | ICD-10-CM

## 2014-03-26 DIAGNOSIS — I716 Thoracoabdominal aortic aneurysm, without rupture, unspecified: Secondary | ICD-10-CM

## 2014-03-26 MED ORDER — IOHEXOL 350 MG/ML SOLN
75.0000 mL | Freq: Once | INTRAVENOUS | Status: AC | PRN
  Administered 2014-03-26: 75 mL via INTRAVENOUS

## 2014-03-26 NOTE — Progress Notes (Signed)
Patient name: Alfred Hall MRN: [id number] DOB: 1929/08/07 Sex: male     Chief Complaint  Patient presents with  . Re-evaluation    f/u from 11/2013 visit with CTA prior    HISTORY OF PRESENT ILLNESS: The patient is back today for follow-up.  I initially met him in 10 2015 for an abdominal aortic aneurysm.  At that time he had a 2 cm lung nodule which was PET positive but biopsy negative.  He has also undergonerepair of an ascending aortic aneurysm with valve replacement and CABG on 11 2015.  He has recovered from this.  He is taking aspirin now and amiodarone.  His hypercholesterolemia is managed with a statin.  He is here today for further discussions regarding his CT scan.  Past Medical History  Diagnosis Date  . Bronchogenic cancer of left lung     a. followed by multidisciplinary thoracic oncology clinic  . PVD (peripheral vascular disease)   . Microhematuria     a. workup negative  . Aortic aneurysm, abdominal   . Hypercholesteremia   . Hypertension   . Thoracic aortic aneurysm without rupture   . Aortic stenosis     a. ECHO at [place]- pk AV grad 77 with a mean rate of 53   . Tobacco abuse   . CAD (coronary artery disease)     a. s/p LHC on 09/18/13 with non-obs disease and severe AS  . COPD (chronic obstructive pulmonary disease)   . Hypoglycemia   . Abdominal aortic aneurysm     Past Surgical History  Procedure Laterality Date  . Cataract extraction    . Eye surgery      CATARACT  . Throat nodule excision    . Nose surgery    . Cardiac catheterization    . Tonsillectomy    . Appendectomy    . Ascending aortic root replacement N/A 12/26/2013    Procedure: ASCENDING AORTIC  REPLACEMENT;  Surgeon: Grace Isaac, MD;  Location: [facility];  Service: Open Heart Surgery;  Laterality: N/A;  . Intraoperative transesophageal echocardiogram N/A 12/26/2013    Procedure: INTRAOPERATIVE TRANSESOPHAGEAL ECHOCARDIOGRAM;  Surgeon: Grace Isaac, MD;  Location: [facility];   Service: Open Heart Surgery;  Laterality: N/A;  . Lung biopsy Left 12/26/2013    Procedure: LUNG BIOPSY;  Surgeon: Grace Isaac, MD;  Location: [facility];  Service: Open Heart Surgery;  Laterality: Left;  . Coronary artery bypass graft N/A 12/26/2013    Procedure: CORONARY ARTERY BYPASS GRAFTING (CABG) times one using the left internal mammary artery.;  Surgeon: Grace Isaac, MD;  Location: [facility];  Service: Open Heart Surgery;  Laterality: N/A;  . Aortic valve replacement  12/26/2013    Procedure: AORTIC VALVE REPLACEMENT (AVR);  Surgeon: Grace Isaac, MD;  Location: [facility];  Service: Open Heart Surgery;;  . Left and right heart catheterization with coronary angiogram N/A 09/18/2013    Procedure: LEFT AND RIGHT HEART CATHETERIZATION WITH CORONARY ANGIOGRAM;  Surgeon: Blane Ohara, MD;  Location: [facility] C[facility];  Service: Cardiovascular;  Laterality: N/A;    History   Social History  . Marital Status: Married    Spouse Name: N/A    Number of Children: 3  . Years of Education: N/A   Occupational History  . retired from [employer]  . Smoking status: Former Smoker -- 0.50 packs/day for 60 years    Types: Cigarettes  Quit date: 12/22/2013  . Smokeless tobacco: Never Used  . Alcohol Use: Yes     Comment: " occasional beer"  . Drug Use: No  . Sexual Activity: Not on file   Other Topics Concern  . Not on file   Social History Narrative    Family History  Problem Relation Age of Onset  . Renal Disease Father   . Congestive Heart Failure Mother     Allergies as of 03/26/2014  . (No Known Allergies)    Current Outpatient Prescriptions on File Prior to Visit  Medication Sig Dispense Refill  . amiodarone (PACERONE) 200 MG tablet Take 1 tablet (200 mg total) by mouth daily. 31 tablet 0  . aspirin EC 325 MG tablet Take 1 tablet (325 mg total) by mouth daily.    Marland Kitchen atorvastatin (LIPITOR) 20 MG tablet Take 1 tablet (20 mg total) by mouth  daily at 6 PM. 31 tablet 0  . cholecalciferol (VITAMIN D) 1000 UNITS tablet Take 400 Units by mouth daily.    . Garlic 001 MG CAPS Take 300 mg by mouth daily.     . metoprolol tartrate (LOPRESSOR) 25 MG tablet Take 0.5 tablets (12.5 mg total) by mouth 2 (two) times daily. 62 tablet 0  . Multiple Vitamins-Minerals (CENTRUM SILVER ADULT 50+ PO) Take 1 tablet by mouth daily.     No current facility-administered medications on file prior to visit.     REVIEW OF SYSTEMS: Cardiovascular: No chest pain, chest pressure, palpitations, orthopnea,  No claudication or rest pain,  No history of DVT or phlebitis.mild shortness of breath with exertion Pulmonary: No productive cough, asthma or wheezing. Neurologic: No weakness, paresthesias, aphasia, or amaurosis. No dizziness. Hematologic: No bleeding problems or clotting disorders. Musculoskeletal: No joint pain or joint swelling. Gastrointestinal: No blood in stool or hematemesis Genitourinary: No dysuria or hematuria. Psychiatric:: No history of major depression. Integumentary: No rashes or ulcers. Constitutional: No fever or chills.  PHYSICAL EXAMINATION:   Vital signs are BP 138/58 mmHg  Pulse 73  Ht 5' 8"  (1.727 m)  Wt 150 lb (68.04 kg)  BMI 22.81 kg/m2  SpO2 99% General: The patient appears their stated age. HEENT:  No gross abnormalities Pulmonary:  Non labored breathing Abdomen: Soft and non-tender Musculoskeletal: There are no major deformities. Neurologic: No focal weakness or paresthesias are detected, Skin: There are no ulcer or rashes noted. Psychiatric: The patient has normal affect. Cardiovascular: There is a regular rate and rhythm without significant murmur appreciated.palpable dorsalis pedis pulse bilaterally   Diagnostic Studies CT angiogram reveals a 5.7 cm abdominal aortic aneurysm.  Also identified is a left upper lobe mass which has increased from prior exam.  Assessment: Abdominal aortic aneurysm Plan: Based on  the patient's anatomy, I think he is going to need a fenestrated device.  I will need to perform further imaging measurements to see if he is a candidate.  I did discuss the risks and benefits of endovascular repair including the risk of cardiopulmonary complications, intestinal ischemia lower extremity ischemia renal failure.  I believe he will be a candidate for fenestrated repair once I have reviewed his images on Tera recon.  I am going to asked Dr. Servando Snare to evaluate his CT scan regarding the long mass which has increased in size.  Eldridge Abrahams, M.D. [facility] Office: [phone] Pager:  [phone]

## 2014-03-27 ENCOUNTER — Telehealth: Payer: Self-pay | Admitting: Cardiothoracic Surgery

## 2014-03-27 ENCOUNTER — Other Ambulatory Visit: Payer: Self-pay

## 2014-03-27 DIAGNOSIS — R911 Solitary pulmonary nodule: Secondary | ICD-10-CM

## 2014-03-27 NOTE — Telephone Encounter (Signed)
Call patient and made him aware of the findings on CT scan done yesterday, showing the previously biopsied left lung mass had increased in size. In spite of previous negative biopsy I recommended that we repeat needle biopsy CT-guided since we have seen enlargement. Patient is agreeable with this we will schedule through interventional radiology for biopsy in the coming week.  Grace Isaac MD      [address] [place] [address] Office [phone]   Beeper [phone]  03/27/2014 11:48 AM

## 2014-04-02 ENCOUNTER — Other Ambulatory Visit: Payer: Self-pay | Admitting: Radiology

## 2014-04-03 ENCOUNTER — Ambulatory Visit (HOSPITAL_COMMUNITY): Admission: RE | Admit: 2014-04-03 | Payer: Medicare Other | Source: Ambulatory Visit

## 2014-04-06 ENCOUNTER — Other Ambulatory Visit: Payer: Self-pay

## 2014-04-06 ENCOUNTER — Encounter: Payer: Self-pay | Admitting: Surgery

## 2014-04-06 MED ORDER — METOPROLOL TARTRATE 25 MG PO TABS: 12.5000 mg | ORAL_TABLET | Freq: Two times a day (BID) | ORAL | Status: AC

## 2014-04-06 MED ORDER — ATORVASTATIN CALCIUM 20 MG PO TABS: 20.0000 mg | ORAL_TABLET | Freq: Every day | ORAL | Status: AC

## 2014-04-06 MED ORDER — AMIODARONE HCL 200 MG PO TABS: 200.0000 mg | ORAL_TABLET | Freq: Every day | ORAL | Status: DC

## 2014-04-16 ENCOUNTER — Other Ambulatory Visit: Payer: Self-pay | Admitting: Radiology

## 2014-04-17 ENCOUNTER — Other Ambulatory Visit: Payer: Self-pay | Admitting: Radiology

## 2014-04-18 ENCOUNTER — Ambulatory Visit (HOSPITAL_COMMUNITY)
Admission: RE | Admit: 2014-04-18 | Discharge: 2014-04-18 | Disposition: A | Discharge status: Home / Self Care | Payer: Medicare Other | Source: Ambulatory Visit | Attending: Interventional Radiology | Admitting: Interventional Radiology

## 2014-04-18 ENCOUNTER — Encounter (HOSPITAL_COMMUNITY): Payer: Self-pay

## 2014-04-18 ENCOUNTER — Ambulatory Visit (HOSPITAL_COMMUNITY)
Admission: RE | Admit: 2014-04-18 | Discharge: 2014-04-18 | Disposition: A | Discharge status: Home / Self Care | Payer: Medicare Other | Source: Ambulatory Visit | Attending: Cardiothoracic Surgery | Admitting: Cardiothoracic Surgery

## 2014-04-18 DIAGNOSIS — R918 Other nonspecific abnormal finding of lung field: Secondary | ICD-10-CM | POA: Diagnosis not present

## 2014-04-18 DIAGNOSIS — I714 Abdominal aortic aneurysm, without rupture: Secondary | ICD-10-CM | POA: Diagnosis not present

## 2014-04-18 DIAGNOSIS — R911 Solitary pulmonary nodule: Secondary | ICD-10-CM

## 2014-04-18 DIAGNOSIS — J95811 Postprocedural pneumothorax: Secondary | ICD-10-CM

## 2014-04-18 LAB — PROTIME-INR
INR: 1.11 (ref 0.00–1.49)
Prothrombin Time: 14.4 seconds (ref 11.6–15.2)

## 2014-04-18 LAB — CBC
HEMATOCRIT: 37.7 % — AB (ref 39.0–52.0)
Hemoglobin: 12 g/dL — ABNORMAL LOW (ref 13.0–17.0)
MCH: 26.2 pg (ref 26.0–34.0)
MCHC: 31.8 g/dL (ref 30.0–36.0)
MCV: 82.3 fL (ref 78.0–100.0)
PLATELETS: 263 10*3/uL (ref 150–400)
RBC: 4.58 MIL/uL (ref 4.22–5.81)
RDW: 15.9 % — ABNORMAL HIGH (ref 11.5–15.5)
WBC: 9.8 10*3/uL (ref 4.0–10.5)

## 2014-04-18 LAB — APTT: aPTT: 31 seconds (ref 24–37)

## 2014-04-18 MED ORDER — LIDOCAINE HCL 1 % IJ SOLN
INTRAMUSCULAR | Status: AC
  Filled 2014-04-18: qty 20

## 2014-04-18 MED ORDER — FENTANYL CITRATE 0.05 MG/ML IJ SOLN
INTRAMUSCULAR | Status: AC | PRN
  Administered 2014-04-18: 25 ug via INTRAVENOUS
  Administered 2014-04-18: 50 ug via INTRAVENOUS

## 2014-04-18 MED ORDER — SODIUM CHLORIDE 0.9 % IV SOLN
INTRAVENOUS | Status: DC
  Administered 2014-04-18: 09:00:00 via INTRAVENOUS

## 2014-04-18 MED ORDER — MIDAZOLAM HCL 2 MG/2ML IJ SOLN
INTRAMUSCULAR | Status: AC
  Filled 2014-04-18: qty 4

## 2014-04-18 MED ORDER — MIDAZOLAM HCL 2 MG/2ML IJ SOLN
INTRAMUSCULAR | Status: AC | PRN
  Administered 2014-04-18: 1 mg via INTRAVENOUS
  Administered 2014-04-18: 0.5 mg via INTRAVENOUS

## 2014-04-18 MED ORDER — FENTANYL CITRATE 0.05 MG/ML IJ SOLN
INTRAMUSCULAR | Status: AC
  Filled 2014-04-18: qty 4

## 2014-04-18 NOTE — Discharge Instructions (Signed)
Needle Biopsy of Lung, Care After °Refer to this sheet in the next few weeks. These instructions provide you with information on caring for yourself after your procedure. Your health care provider may also give you more specific instructions. Your treatment has been planned according to current medical practices, but problems sometimes occur. Call your health care provider if you have any problems or questions after your procedure. °WHAT TO EXPECT AFTER THE PROCEDURE °· A bandage will be applied over the area where the needle was inserted. You may be asked to apply pressure to the bandage for several minutes to ensure there is minimal bleeding. °· In most cases, you can leave when your needle biopsy procedure is completed. Do not drive yourself home. Someone else should take you home. °· If you received an IV sedative or general anesthetic, you will be taken to a comfortable place to relax while the medicine wears off. °· If you have upcoming travel scheduled, talk to your health care provider about when it is safe to travel by air after the procedure. °HOME CARE INSTRUCTIONS °· Expect to take it easy for the rest of the day. °· Protect the area where you received the needle biopsy by keeping the bandage in place for as long as instructed. °· You may feel some mild pain or discomfort in the area, but this should stop in a day or two. °· Take medicines only as directed by your health care provider. °SEEK MEDICAL CARE IF:  °· You have pain at the biopsy site that worsens or is not helped by medicine. °· You have swelling or drainage at the needle biopsy site. °· You have a fever. °SEEK IMMEDIATE MEDICAL CARE IF:  °· You have new or worsening shortness of breath. °· You have chest pain. °· You are coughing up blood. °· You have bleeding that does not stop with pressure or a bandage. °· You develop light-headedness or fainting. °Document Released: 11/30/2006 Document Revised: 06/19/2013 Document Reviewed:  06/27/2012 °ExitCare® Patient Information ©2015 [employer]. This information is not intended to replace advice given to you by your health care provider. Make sure you discuss any questions you have with your health care provider. ° °

## 2014-04-18 NOTE — Progress Notes (Signed)
Interventional Radiology Progress Note  81 yo male SP left upper lobe lung biopsy.   Doing well, with no complaints.  No CP, SOB, hemoptysis.  Question small PTX on follow up CXR, which appears be resolving/resolved on follow up PA/Lat.   Patient understands to do no strenuous activity for 4-5 days.  May return to cardiac rehab next Tuesday.  Patient understands to visit local ED if has any symptoms or acute changes.   Agrees with Plan of Care for DC.   Follow up appointment next week with primary physician.   Signed,  Dulcy Fanny. Earleen Newport, DO

## 2014-04-18 NOTE — Sedation Documentation (Signed)
Aware on daily ASA 325mg , last dose yesterday

## 2014-04-18 NOTE — H&P (Signed)
Chief Complaint: "I am here for a lung biopsy."  Referring Physician(s): Gerhardt,Edward B  History of Present Illness: Alfred Hall is a 81 y.o. male with left upper lobe lung mass, increasing in size, hypermetabolic on PET. The patient is s/p biopsy on 09/2013, no evidence of malignancy. IR received request for repeat CT guided lung mass biopsy. He denies any chest pain, shortness of breath or palpitations. He denies any active signs of bleeding or excessive bruising. He denies any recent fever or chills. The patient denies any history of sleep apnea or chronic oxygen use. He has previously tolerated sedation without complications.   Past Medical History  Diagnosis Date  . Bronchogenic cancer of left lung     a. followed by multidisciplinary thoracic oncology clinic  . PVD (peripheral vascular disease)   . Microhematuria     a. workup negative  . Aortic aneurysm, abdominal   . Hypercholesteremia   . Hypertension   . Thoracic aortic aneurysm without rupture   . Aortic stenosis     a. ECHO at [place]- pk AV grad 77 with a mean rate of 53   . Tobacco abuse   . CAD (coronary artery disease)     a. s/p LHC on 09/18/13 with non-obs disease and severe AS  . COPD (chronic obstructive pulmonary disease)   . Hypoglycemia   . Abdominal aortic aneurysm     Past Surgical History  Procedure Laterality Date  . Cataract extraction    . Eye surgery      CATARACT  . Throat nodule excision    . Nose surgery    . Cardiac catheterization    . Tonsillectomy    . Appendectomy    . Ascending aortic root replacement N/A 12/26/2013    Procedure: ASCENDING AORTIC  REPLACEMENT;  Surgeon: Grace Isaac, MD;  Location: [place];  Service: Open Heart Surgery;  Laterality: N/A;  . Intraoperative transesophageal echocardiogram N/A 12/26/2013    Procedure: INTRAOPERATIVE TRANSESOPHAGEAL ECHOCARDIOGRAM;  Surgeon: Grace Isaac, MD;  Location: [place];  Service: Open Heart Surgery;  Laterality: N/A;    . Lung biopsy Left 12/26/2013    Procedure: LUNG BIOPSY;  Surgeon: Grace Isaac, MD;  Location: [place];  Service: Open Heart Surgery;  Laterality: Left;  . Coronary artery bypass graft N/A 12/26/2013    Procedure: CORONARY ARTERY BYPASS GRAFTING (CABG) times one using the left internal mammary artery.;  Surgeon: Grace Isaac, MD;  Location: [place];  Service: Open Heart Surgery;  Laterality: N/A;  . Aortic valve replacement  12/26/2013    Procedure: AORTIC VALVE REPLACEMENT (AVR);  Surgeon: Grace Isaac, MD;  Location: [place];  Service: Open Heart Surgery;;  . Left and right heart catheterization with coronary angiogram N/A 09/18/2013    Procedure: LEFT AND RIGHT HEART CATHETERIZATION WITH CORONARY ANGIOGRAM;  Surgeon: Blane Ohara, MD;  Location: [facility] CATH LAB;  Service: Cardiovascular;  Laterality: N/A;    Allergies: Review of patient's allergies indicates no known allergies.  Medications: Prior to Admission medications   Medication Sig Start Date End Date Taking? Authorizing Provider  amiodarone (PACERONE) 200 MG tablet Take 1 tablet (200 mg total) by mouth daily. 04/06/14  Yes Thayer Headings, MD  aspirin EC 325 MG tablet Take 1 tablet (325 mg total) by mouth daily. 01/08/14  Yes Donielle Liston Alba, PA-C  atorvastatin (LIPITOR) 20 MG tablet Take 1 tablet (20 mg total) by mouth daily at 6 PM. 04/06/14  Yes Thayer Headings, MD  cholecalciferol (VITAMIN D) 1000 UNITS tablet Take 1,000 Units by mouth daily.    Yes Historical Provider, MD  Garlic 696 MG CAPS Take 300 mg by mouth daily.    Yes Historical Provider, MD  metoprolol tartrate (LOPRESSOR) 25 MG tablet Take 0.5 tablets (12.5 mg total) by mouth 2 (two) times daily. 04/06/14  Yes Thayer Headings, MD  Multiple Vitamins-Minerals (CENTRUM SILVER ADULT 50+ PO) Take 1 tablet by mouth daily.   Yes Historical Provider, MD     Family History  Problem Relation Age of Onset  . Renal Disease Father   . Congestive Heart Failure  Mother     History   Social History  . Marital Status: Married    Spouse Name: N/A  . Number of Children: 3  . Years of Education: N/A   Occupational History  . retired from [employer]  . Smoking status: Former Smoker -- 0.50 packs/day for 60 years    Types: Cigarettes    Quit date: 12/22/2013  . Smokeless tobacco: Never Used  . Alcohol Use: Yes     Comment: " occasional beer"  . Drug Use: No  . Sexual Activity: Not on file   Other Topics Concern  . None   Social History Narrative   Review of Systems: A 12 point ROS discussed and pertinent positives are indicated in the HPI above.  All other systems are negative.  Review of Systems  Vital Signs: BP 175/85 mmHg  Pulse 65  Temp(Src) 97.4 F (36.3 C) (Oral)  Resp 18  Ht 5\' 9"  (1.753 m)  Wt 150 lb (68.04 kg)  BMI 22.14 kg/m2  SpO2 100%  Physical Exam  Constitutional: He is oriented to person, place, and time. No distress.  HENT:  Head: Normocephalic and atraumatic.  Neck: No tracheal deviation present.  Cardiovascular: Normal rate and regular rhythm.  Exam reveals no gallop and no friction rub.   No murmur heard. Pulmonary/Chest: Effort normal and breath sounds normal. No respiratory distress. He has no rales.  Abdominal: Soft. Bowel sounds are normal. He exhibits no distension. There is no tenderness.  Neurological: He is alert and oriented to person, place, and time.  Skin: He is not diaphoretic.  Psychiatric: He has a normal mood and affect. His behavior is normal. Thought content normal.    Mallampati Score:  MD Evaluation Airway: WNL Heart: WNL Abdomen: WNL Chest/ Lungs: WNL ASA  Classification: 3 Mallampati/Airway Score: Two  Imaging: Ct Angio Chest Aorta W/cm &/or Wo/cm  03/26/2014   CLINICAL DATA:  Thoracoabdominal aortic aneurysm.  EXAM: CT ANGIOGRAPHY CHEST, ABDOMEN AND PELVIS  TECHNIQUE: Multidetector CT imaging through the chest, abdomen and pelvis was  performed using the standard protocol during bolus administration of intravenous contrast. Multiplanar reconstructed images and MIPs were obtained and reviewed to evaluate the vascular anatomy.  CONTRAST:  36mL OMNIPAQUE IOHEXOL 350 MG/ML SOLN  COMPARISON:  CT scan of chest of September 29, 2013.  FINDINGS: CTA CHEST FINDINGS  No pneumothorax is noted. Minimal left pleural effusion is noted. Emphysematous disease is noted in the upper lobes bilaterally. 4.5 x 3.7 cm mass is noted posteriorly in the left upper lobe which is significantly increased in size compared to prior exam and therefore concerning for malignancy.  There is no evidence of thoracic aortic dissection. Moderate atheromatous disease and calcified plaque is noted in the descending thoracic aorta. Status post aortic valve repair. 4.2 cm  ascending aortic aneurysm is noted. Transverse aortic arch measures 2.8 cm 2.7 cm in diameter. Proximal portion of descending thoracic aorta measures 3.3 cm in diameter. Distal portion of descending thoracic aorta measures 3.0 cm. Great vessels are widely pain without significant stenosis.  Review of the MIP images confirms the above findings.  CTA ABDOMEN AND PELVIS FINDINGS  Cholelithiasis is noted. No focal abnormality is noted in the liver, spleen or pancreas. Adrenal glands appear normal. Bilateral renal cysts are noted with the largest measuring 5.3 cm. No hydronephrosis or renal obstruction is noted. There is no evidence of bowel obstruction. Urinary bladder appears normal. No abnormal fluid collection is noted.  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 Superior mesenteric and bilateral renal arteries are widely patent. Two renal arteries are noted on the right. Infrarenal abdominal aortic aneurysm with mural thrombus is noted, measuring 5.7 cm in maximum AP diameter and 5.4 cm in maximum transverse diameter. It extends to the aortic bifurcation. Iliac arteries are  widely patent, without significant aneurysmal dilatation but extensive calcifications. Infrarenal neck measures less than 1 cm in length with minor axis of 17 mm. No dissection is noted.  Review of the MIP images confirms the above findings.  IMPRESSION: 4.5 x 3.7 cm left upper lobe mass is noted which is significantly increased compared to prior exam, and concerning for malignancy.  4.2 cm ascending thoracic aortic aneurysm is noted with dilatation of the descending thoracic noted as well.  Cholelithiasis.  Infrarenal abdominal aortic aneurysm is noted with mural thrombus which measures 5.7 x 5.4 cm in size.  No dissection is noted in the thoracic or abdominal aorta.  These results will be called to the ordering clinician or representative by the Radiologist Assistant, and communication documented in the PACS or zVision Dashboard.   Electronically Signed   By: Sabino Dick M.D.   On: 03/26/2014 14:14   Ct Angio Abd/pel W/ And/or W/o  03/26/2014   CLINICAL DATA:  Thoracoabdominal aortic aneurysm.  EXAM: CT ANGIOGRAPHY CHEST, ABDOMEN AND PELVIS  TECHNIQUE: Multidetector CT imaging through the chest, abdomen and pelvis was performed using the standard protocol during bolus administration of intravenous contrast. Multiplanar reconstructed images and MIPs were obtained and reviewed to evaluate the vascular anatomy.  CONTRAST:  23mL OMNIPAQUE IOHEXOL 350 MG/ML SOLN  COMPARISON:  CT scan of chest of September 29, 2013.  FINDINGS: CTA CHEST FINDINGS  No pneumothorax is noted. Minimal left pleural effusion is noted. Emphysematous disease is noted in the upper lobes bilaterally. 4.5 x 3.7 cm mass is noted posteriorly in the left upper lobe which is significantly increased in size compared to prior exam and therefore concerning for malignancy.  There is no evidence of thoracic aortic dissection. Moderate atheromatous disease and calcified plaque is noted in the descending thoracic aorta. Status post aortic valve repair. 4.2 cm  ascending aortic aneurysm is noted. Transverse aortic arch measures 2.8 cm 2.7 cm in diameter. Proximal portion of descending thoracic aorta measures 3.3 cm in diameter. Distal portion of descending thoracic aorta measures 3.0 cm. Great vessels are widely pain without significant stenosis.  Review of the MIP images confirms the above findings.  CTA ABDOMEN AND PELVIS FINDINGS  Cholelithiasis is noted. No focal abnormality is noted in the liver, spleen or pancreas. Adrenal glands appear normal. Bilateral renal cysts are noted with the largest measuring 5.3 cm. No hydronephrosis or renal obstruction is noted. There is no evidence of bowel obstruction. Urinary  bladder appears normal. No abnormal fluid collection is noted.  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 Superior mesenteric and bilateral renal arteries are widely patent. Two renal arteries are noted on the right. Infrarenal abdominal aortic aneurysm with mural thrombus is noted, measuring 5.7 cm in maximum AP diameter and 5.4 cm in maximum transverse diameter. It extends to the aortic bifurcation. Iliac arteries are widely patent, without significant aneurysmal dilatation but extensive calcifications. Infrarenal neck measures less than 1 cm in length with minor axis of 17 mm. No dissection is noted.  Review of the MIP images confirms the above findings.  IMPRESSION: 4.5 x 3.7 cm left upper lobe mass is noted which is significantly increased compared to prior exam, and concerning for malignancy.  4.2 cm ascending thoracic aortic aneurysm is noted with dilatation of the descending thoracic noted as well.  Cholelithiasis.  Infrarenal abdominal aortic aneurysm is noted with mural thrombus which measures 5.7 x 5.4 cm in size.  No dissection is noted in the thoracic or abdominal aorta.  These results will be called to the ordering clinician or representative by the Radiologist Assistant, and communication documented  in the PACS or zVision Dashboard.   Electronically Signed   By: Sabino Dick M.D.   On: 03/26/2014 14:14    Labs:  CBC:  Recent Labs  01/01/14 0328 01/02/14 0435 01/08/14 0447 04/18/14 0853  WBC 11.5* 11.5* 13.0* 9.8  HGB 8.9* 8.6* 9.4* 12.0*  HCT 26.4* 26.8* 30.6* 37.7*  PLT 182 273 538* 263    COAGS:  Recent Labs  12/26/13 1830 12/26/13 2230 12/27/13 0400 12/28/13 0400 12/29/13 0400 04/18/14 0853  INR 1.98* 1.55* 1.47 1.57* 1.36 1.11  APTT 58* 42* 40*  --   --  31    BMP:  Recent Labs  12/31/13 0300 01/01/14 0328 01/02/14 0435 01/04/14 0450 01/24/14 1207  NA 138 138 138 140 130*  K 3.5* 4.0 4.1 4.0 4.3  CL 97 97 97 96 91*  CO2 28 29 28 29  32  GLUCOSE 97 102* 96 99 96  BUN 39* 37* 33* 37* 20  CALCIUM 8.1* 8.4 8.4 8.5 8.7  CREATININE 1.32 1.25 1.35 1.38* 1.4  GFRNONAA 49* 52* 47* 46*  --   GFRAA 56* 60* 55* 53*  --     LIVER FUNCTION TESTS:  Recent Labs  12/22/13 1420 12/28/13 0400  BILITOT 0.2* 0.8  AST 18 35  ALT 10 11  ALKPHOS 53 31*  PROT 7.4 4.9*  ALBUMIN 3.9 2.8*   Assessment and Plan: Left upper lobe lung mass, increasing in size Hypermetabolic on PET S/p biopsy 09/2013, no evidence of malignancy Request for repeat CT guided lung mass biopsy. Patient has been NPO, no blood thinners taken, labs reviewed. Risks and Benefits discussed with the patient including, but not limited to bleeding, hemoptysis, respiratory failure requiring intubation, infection, pneumothorax requiring chest tube placement, stroke from air embolism or even death. All of the patient's questions were answered, patient is agreeable to proceed. Consent signed and in chart. Thoracic AAA   Thank you for this interesting consult.  I greatly enjoyed meeting TAEVYN HAUSEN and look forward to participating in their care.  SignedHedy Jacob 04/18/2014, 9:45 AM   I spent a total of 20 Minutes in face to face in clinical consultation, greater than 50% of  which was counseling/coordinating care for Left upper lobe lung mass.

## 2014-04-18 NOTE — Procedures (Signed)
Interventional Radiology Procedure Note  Procedure: CT guided core biopsy of left upper lobe mass.  4 x 18 G core.  Deployment of [employer] device to decrease PTX risk Complications: None Recommendations: - Bedrest supine x 4 hrs - CXR in 3 hours - Follow biopsy results  Signed,  Dulcy Fanny. Earleen Newport, DO

## 2014-04-23 ENCOUNTER — Encounter: Payer: Self-pay | Admitting: Cardiothoracic Surgery

## 2014-04-23 ENCOUNTER — Ambulatory Visit (INDEPENDENT_AMBULATORY_CARE_PROVIDER_SITE_OTHER): Payer: Medicare Other | Admitting: Cardiothoracic Surgery

## 2014-04-23 VITALS — BP 140/75 | HR 72 | Resp 20 | Ht 69.0 in | Wt 150.0 lb

## 2014-04-23 DIAGNOSIS — C3412 Malignant neoplasm of upper lobe, left bronchus or lung: Secondary | ICD-10-CM | POA: Diagnosis not present

## 2014-04-23 DIAGNOSIS — Z9889 Other specified postprocedural states: Secondary | ICD-10-CM | POA: Diagnosis not present

## 2014-04-23 NOTE — Progress Notes (Signed)
[address]       [place] [address]             [phone]      Bynum P Dalto [place] Medical Record #[id number] Date of Birth: 1929/08/06  Referring: Sherren Mocha, MD Primary Care: Augustin Coupe, MD  Chief Complaint:   POST OP FOLLOW UP 12/26/2013  OPERATIVE REPORT PREOPERATIVE DIAGNOSES: Ascending aortic aneurysm, severe critical aortic stenosis, coronary artery disease, left lung mass. POSTOPERATIVE DIAGNOSES: Ascending aortic aneurysm, severe critical aortic stenosis, coronary artery disease, left lung mass. SURGICAL PROCEDURES: Supracoronary replacement of ascending aorta with Hemashield graft 32 mm, replacement of aortic valve with pericardial tissue valve 25 mm, [employer], model 3300TFX, serial [id number]; coronary artery bypass grafting with the left internal mammary to the left anterior descending coronary artery, biopsy of left lung mass. SURGEON: Lanelle Bal, MD Path: Diagnosis 1. Lung, biopsy, Left upper lobe - FIBROSIS AND INFLAMMATION. - NO MALIGNANCY IDENTIFIED. 2. Aneurysm, aortic, Ascending aortic - LARGE VESSEL WALL WITH MYXOID DEGENERATION. 3. Heart valve leaflets, Aortic valve - VALVE TISSUE WITH NODULAR CALCIFICATIONS. Vicente Males MD Pathologist, Electronic Signature  Repeat Biopsy: 04/19/2014  Diagnosis Lung, needle/core biopsy(ies), Left upper lobe mass - INVASIVE SQUAMOUS CELL CARCINOMA, SEE COMMENT. Microscopic Comment The case was reviewed by Dr Lyndon Code who concurs. There is adequate tumor for additional ancillary tumor testing. (CRR:ecj 04/19/2014) Mali RUND DO Pathologist, Electronic Signature (Case signed 04/19/2014)  History of Present Illness:     Patient returns after replacement of his ascending aorta aortic valve coronary artery bypass graft. He is making steady progress. Now not needing O2 and increasing activity. Currently involved in cardiac rehabilitation. Preoperatively  when he originally presented with a large ascending aorta a left upper lobe lung mass was present this was biopsied once preoperatively and intraoperatively, both specimens showed no evidence of malignancy. Follow-up CT scan done several weeks ago confirm the left upper lobe lung mass was enlarging in size and arrange for the patient to have a repeat CT scan of the chest, which now confirms invasive squamous cell carcinoma. Although the patient tolerated his recent cardiac surgery he was with some difficulty, the degree of emphysema and age, and known almost 6 cm abdominal aortic aneurysm do not think he would tolerate a left upper lobectomy. He comes in today to review the pathology findings and discuss treatment options.     Past Medical History  Diagnosis Date  . PVD (peripheral vascular disease)   . Microhematuria     a. workup negative  . Aortic aneurysm, abdominal   . Hypercholesteremia   . Hypertension   . Thoracic aortic aneurysm without rupture   . Aortic stenosis     a. ECHO at [place]- pk AV grad 77 with a mean rate of 53   . Tobacco abuse   . CAD (coronary artery disease)     a. s/p LHC on 09/18/13 with non-obs disease and severe AS  . COPD (chronic obstructive pulmonary disease)   . Hypoglycemia   . Abdominal aortic aneurysm      History  Smoking status  . Former Smoker -- 0.50 packs/day for 60 years  . Types: Cigarettes  . Quit date: 12/22/2013  Smokeless tobacco  . Never Used    History  Alcohol Use  . Yes    Comment: " occasional beer"     No Known Allergies  Current Outpatient Prescriptions  Medication Sig Dispense Refill  . amiodarone (PACERONE)  200 MG tablet Take 1 tablet (200 mg total) by mouth daily. 31 tablet 6  . aspirin EC 325 MG tablet Take 1 tablet (325 mg total) by mouth daily.    Marland Kitchen atorvastatin (LIPITOR) 20 MG tablet Take 1 tablet (20 mg total) by mouth daily at 6 PM. 31 tablet 6  . cholecalciferol (VITAMIN D) 1000 UNITS tablet Take 1,000 Units by  mouth daily.     . Garlic 956 MG CAPS Take 300 mg by mouth daily.     . metoprolol tartrate (LOPRESSOR) 25 MG tablet Take 0.5 tablets (12.5 mg total) by mouth 2 (two) times daily. 62 tablet 6  . Multiple Vitamins-Minerals (CENTRUM SILVER ADULT 50+ PO) Take 1 tablet by mouth daily.     No current facility-administered medications for this visit.       Physical Exam: BP 140/75 mmHg  Pulse 72  Resp 20  Ht 5\' 9"  (1.753 m)  Wt 150 lb (68.04 kg)  BMI 22.14 kg/m2  SpO2 95%  General appearance: alert and cooperative Neurologic: intact Heart: regular rate and rhythm, S1, S2 normal, no murmur, click, rub or gallop Lungs: clear to auscultation bilaterally Abdomen: soft, non-tender; bowel sounds normal;  non tender palpable AAA  no organomegaly Extremities: extremities normal, atraumatic, no cyanosis or edema and Homans sign is negative, no sign of DVT Wound: well healed sternum, and chest tube sites  Diagnostic Studies & Laboratory data:     Recent Radiology Findings:   Dg Chest 2 View  04/19/2014   CLINICAL DATA:  Recent biopsy, evaluate for pneumothorax  EXAM: CHEST  2 VIEW  COMPARISON:  Portable chest x-ray of 04/18/2014 and CT chest of 03/26/2014  FINDINGS: The large left upper lobe mass is again noted consistent with primary lung carcinoma. A left pleural effusion is present. The lungs are hyperaerated consistent with emphysema. The heart is mildly enlarged and stable. Median sternotomy sutures are noted from prior aortic valve replacement.  IMPRESSION: 1. No change in large left upper lobe mass consistent with primary lung carcinoma. 2. Small left pleural effusion. 3. Emphysema.   Electronically Signed   By: Ivar Drape M.D.   On: 04/19/2014 07:52   Ct Biopsy  04/18/2014   CLINICAL DATA:  81 year old male with a left upper lobe mass. That the mass has been previously biopsied with inflammatory cells present. Has been referred for a repeat biopsy.  EXAM: CT GUIDED CORE BIOPSY OF LEFT UPPER  LOBE MASS  ANESTHESIA/SEDATION: 1.5  Mg IV Versed; 75 mcg IV Fentanyl  Total Moderate Sedation Time: 19 minutes.  PROCEDURE: The procedure risks, benefits, and alternatives were explained to the patient. Questions regarding the procedure were encouraged and answered. The patient understands and consents to the procedure.  Scout CT of the chest performed for surgical planning purposes.  The left lateral upper chest was prepped with Betadinein a sterile fashion, and a sterile drape was applied covering the operative field. A sterile gown and sterile gloves were used for the procedure. Local anesthesia was provided with 1% Lidocaine.  Once the patient is prepped and draped sterilely, the skin and subcutaneous tissues were generously infiltrated with 1% lidocaine without epinephrine.  Using CT guidance, a 17 gauge guide needle was advanced into the mass of the left upper lobe. Four separate 18 gauge core biopsy were then retrieved.  Before withdrawing the guide needle, a BioSentry device was deployed for decreasing the risk for pneumothorax development.  Once the needle was removed a final CT was  performed.  The patient tolerated the procedure well and remained hemodynamically stable throughout.  No complications were encountered and no significant blood loss was encountered.  COMPLICATIONS: None  FINDINGS: CT of the chest demonstrates mass of the left upper lobe, with a background of severe emphysema.  Images during the case demonstrate safe placement of the tip of 17 gauge guide needle into the anterior aspect of left upper lobe mass.  Status post biopsy, CT scan demonstrates no evidence of pneumothorax. Expected small amount of hemorrhage along the lateral aspect of the mass.  IMPRESSION: Status post CT-guided biopsy of left upper lobe mass. Tissue specimen sent to pathology for complete histopathologic analysis.  Signed,  Dulcy Fanny. Earleen Newport, DO  [facility]  [facility]    Electronically Signed   By: Corrie Mckusick D.O.   On: 04/18/2014 12:49   Dg Chest Port 1 View  04/18/2014   CLINICAL DATA:  81 year old male with a history of left upper lobe lung lesion, status post biopsy.  EXAM: PORTABLE CHEST - 1 VIEW  COMPARISON:  CT biopsy 04/18/2014, prior CT chest 03/26/2014, plain film 02/27/2014  FINDINGS: Cardiomediastinal silhouette within normal limits in size and contour. Atherosclerotic calcifications of the aortic arch.  Surgical changes of prior median sternotomy and aortic valve replacement.  Rounded density within the left upper lung, compatible with known lung mass.  Vague lucency along the left heart border. No evidence of large pleural fluid.  Right lung well aerated.  IMPRESSION: Status post left lung biopsy there is suspicion of small anterior pneumothorax.  Left upper lobe lung mass, previously demonstrated on the cross-sectional imaging, status post biopsy.  Surgical changes of median sternotomy and aortic valve replacement.  Atherosclerosis.  Signed,  Dulcy Fanny. Earleen Newport, DO  [facility]  [facility]   Electronically Signed   By: Corrie Mckusick D.O.   On: 04/18/2014 15:31   Ct Angio Chest Aorta W/cm &/or Wo/cm  03/26/2014   CLINICAL DATA:  Thoracoabdominal aortic aneurysm.  EXAM: CT ANGIOGRAPHY CHEST, ABDOMEN AND PELVIS  TECHNIQUE: Multidetector CT imaging through the chest, abdomen and pelvis was performed using the standard protocol during bolus administration of intravenous contrast. Multiplanar reconstructed images and MIPs were obtained and reviewed to evaluate the vascular anatomy.  CONTRAST:  87mL OMNIPAQUE IOHEXOL 350 MG/ML SOLN  COMPARISON:  CT scan of chest of September 29, 2013.  FINDINGS: CTA CHEST FINDINGS  No pneumothorax is noted. Minimal left pleural effusion is noted. Emphysematous disease is noted in the upper lobes bilaterally. 4.5 x 3.7 cm mass is noted posteriorly in the left upper lobe which is significantly  increased in size compared to prior exam and therefore concerning for malignancy.  There is no evidence of thoracic aortic dissection. Moderate atheromatous disease and calcified plaque is noted in the descending thoracic aorta. Status post aortic valve repair. 4.2 cm ascending aortic aneurysm is noted. Transverse aortic arch measures 2.8 cm 2.7 cm in diameter. Proximal portion of descending thoracic aorta measures 3.3 cm in diameter. Distal portion of descending thoracic aorta measures 3.0 cm. Great vessels are widely pain without significant stenosis.  Review of the MIP images confirms the above findings.  CTA ABDOMEN AND PELVIS FINDINGS  Cholelithiasis is noted. No focal abnormality is noted in the liver, spleen or pancreas. Adrenal glands appear normal. Bilateral renal cysts are noted with the largest measuring 5.3 cm. No hydronephrosis or renal obstruction is noted. There is no evidence of bowel obstruction. Urinary  bladder appears normal. No abnormal fluid collection is noted.  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 Superior mesenteric and bilateral renal arteries are widely patent. Two renal arteries are noted on the right. Infrarenal abdominal aortic aneurysm with mural thrombus is noted, measuring 5.7 cm in maximum AP diameter and 5.4 cm in maximum transverse diameter. It extends to the aortic bifurcation. Iliac arteries are widely patent, without significant aneurysmal dilatation but extensive calcifications. Infrarenal neck measures less than 1 cm in length with minor axis of 17 mm. No dissection is noted.  Review of the MIP images confirms the above findings.  IMPRESSION: 4.5 x 3.7 cm left upper lobe mass is noted which is significantly increased compared to prior exam, and concerning for malignancy.  4.2 cm ascending thoracic aortic aneurysm is noted with dilatation of the descending thoracic noted as well.  Cholelithiasis.  Infrarenal abdominal  aortic aneurysm is noted with mural thrombus which measures 5.7 x 5.4 cm in size.  No dissection is noted in the thoracic or abdominal aorta.  These results will be called to the ordering clinician or representative by the Radiologist Assistant, and communication documented in the PACS or zVision Dashboard.   Electronically Signed   By: Sabino Dick M.D.   On: 03/26/2014 14:14   I have independently reviewed the above radiology studies  and reviewed the findings with the patient.   Recent Lab Findings: Lab Results  Component Value Date   WBC 9.8 04/18/2014   HGB 12.0* 04/18/2014   HCT 37.7* 04/18/2014   PLT 263 04/18/2014   GLUCOSE 96 01/24/2014   ALT 11 12/28/2013   AST 35 12/28/2013   NA 130* 01/24/2014   K 4.3 01/24/2014   CL 91* 01/24/2014   CREATININE 1.4 01/24/2014   BUN 20 01/24/2014   CO2 32 01/24/2014   INR 1.11 04/18/2014   HGBA1C 6.0* 12/22/2013      Assessment / Plan:  Biopsy confirmed   INVASIVE SQUAMOUS CELL CARCINOMA of the left upper lobe. Previous biopsy negative x2   Although the patient tolerated his recent cardiac surgery he was with some difficulty, the degree of emphysema and age, and known almost 6 cm abdominal aortic aneurysm do not think he would tolerate a left upper lobectomy. I discussed with him possibility of radiation therapy. Previously he saw Dr. Pablo Ledger prior to the previous biopsy and surgery. We'll make arrangements for him to see her again now that we have a positive biopsy  Doing well following CABG, AVR and replacement of acending aorta  Being Evaluated by  Dr Trula Slade for treatment of AAA-   I  spent 35 minutes counseling the patient face to face and 50% or more the  time was spent in counseling and coordination of care. The total time spent in the appointment was 50 minutes Plan to see the patient back in 6 weeks.  Grace Isaac MD      [address] [place] [address] Office [phone]   Beeper  [phone]  04/23/2014 2:20 PM

## 2014-04-24 ENCOUNTER — Ambulatory Visit: Payer: Medicare Other | Admitting: Cardiothoracic Surgery

## 2014-04-27 ENCOUNTER — Encounter: Payer: Self-pay | Admitting: *Deleted

## 2014-04-27 ENCOUNTER — Telehealth: Payer: Self-pay | Admitting: *Deleted

## 2014-04-27 NOTE — Telephone Encounter (Signed)
I was checking on thoracic clinic patient list.  Patient is set up to see Dr. Pablo Ledger on 05/18/14.  He was given instructions for thoracic clinic on 05/03/14.  I called [facility] to clarify and also Rad Onc.  I am not sure about miss communication and no notes in [facility] to explain.  I called patient and left a vm message to call to clarify appt.

## 2014-04-27 NOTE — Telephone Encounter (Signed)
Called patient to clarify appt for thoracic clinic.  He is unable to make March 17th appt so he has been scheduled for 05/18/14 with Dr. Pablo Ledger.

## 2014-04-27 NOTE — [facility] Oncology Navigator Note (Unsigned)
Please see telephone note

## 2014-05-01 ENCOUNTER — Telehealth: Payer: Self-pay | Admitting: *Deleted

## 2014-05-01 ENCOUNTER — Other Ambulatory Visit: Payer: Self-pay | Admitting: Radiation Oncology

## 2014-05-01 DIAGNOSIS — C3412 Malignant neoplasm of upper lobe, left bronchus or lung: Secondary | ICD-10-CM

## 2014-05-01 NOTE — Telephone Encounter (Signed)
CALLED PATIENT TO INFORM OF PET SCAN ON 05-14-14 @ [place], SPOKE WITH PATIENT AND HE IS AWARE OF THIS APPT.

## 2014-05-03 ENCOUNTER — Ambulatory Visit: Payer: Medicare Other | Admitting: Cardiovascular Disease

## 2014-05-03 ENCOUNTER — Ambulatory Visit: Payer: Medicare Other | Admitting: Cardiothoracic Surgery

## 2014-05-04 ENCOUNTER — Ambulatory Visit: Payer: Medicare Other | Admitting: Cardiovascular Disease

## 2014-05-17 NOTE — Progress Notes (Signed)
Thoracic Location of Tumor / Histology:Left upper lung lobe and 3 cm right upper lobe mass.NSCLCA  Patient presented  months ago with symptoms WG:YKZL found on chest x-ray during annual check-up at [place].Initially shown as scar tissue on biopsy July 2015.  Biopsies of 04/18/14:FINAL DIAGNOSIS Diagnosis Lung, needle/core biopsy(ies), Left upper lobe mass - INVASIVE SQUAMOUS CELL CARCINOMA, SEE COMMENT.  Tobacco/Marijuana/Snuff/ETOH use:60 year history of 1/2 ppd.Quit in November 2015.Drinks a beer at times. No drugs.  Past/Anticipated interventions by cardiothoracic surgery, if any:Not candidate for surgery secondary to co-morbities  Past/Anticipated interventions by medical oncology, if any:No scheduled appointments  Signs/Symptoms  Weight changes, if DJT:TSVX 20 lbs back in November 2015 post surgery but has gained all but 3 lbs back.   Respiratory complaints, if BLT:JQZESPQZR/AQTM  Hemoptysis, if any:Yes, increased hemoptysis greatest started Thursday morning up to over 100 cc.Sometimes just streaky.  Pain issues, if AUQ:JFHLK hip pain since   SAFETY ISSUES:  Prior radiation? No  Pacemaker/ICD? No   Possible current pregnancy?N/A  Is the patient on methotrexate? No  Current Complaints / other details:Patient here with daughter whom is a Marine scientist at [employer]. Major surgery November Ascending aortic root replacement.  NKDA

## 2014-05-18 ENCOUNTER — Encounter: Payer: Self-pay | Admitting: Radiation Oncology

## 2014-05-18 ENCOUNTER — Ambulatory Visit
Admission: RE | Admit: 2014-05-18 | Discharge: 2014-05-18 | Disposition: A | Discharge status: Home / Self Care | Payer: Medicare Other | Source: Ambulatory Visit | Attending: Radiation Oncology | Admitting: Radiation Oncology

## 2014-05-18 DIAGNOSIS — C3492 Malignant neoplasm of unspecified part of left bronchus or lung: Secondary | ICD-10-CM | POA: Insufficient documentation

## 2014-05-18 DIAGNOSIS — Z510 Encounter for antineoplastic radiation therapy: Secondary | ICD-10-CM | POA: Insufficient documentation

## 2014-05-18 NOTE — Progress Notes (Signed)
   Department of Radiation Oncology  Phone:  [phone] Fax:        [phone]   Name: Alfred Hall MRN: [id number]  DOB: [birth year]  Date: 05/18/2014  Follow Up Visit Note  Diagnosis: T2N0 Squamous Cell Carcinoma of the left lung  Interval History: Alfred Hall presents today for routine followup.  Since I saw him in August, he has had repair of his thoracic aneurysm and aortic valve replacement. He was in rehab after this and lost about 20 pounds but is doing better. He has also had 3 biopsies of this lung tumor. The final CT guided biopsy on 3/2 finally returned invasive squamous cell carcinoma. He is present with his daughter today. He had a PET scan outside that shows the mass has grown to 5.7 cm with an SUV of 17.2. Most concerning today is that he states he has been coughing up blood over the past week or so. This has measured up to several tablespoons. He is just on a baby aspirin now. He notes no change in his breathing and no chronic cough. He does have transportation concerns as he will be driving back and forth to treatment or staying here. He is the primary caregiver for his wife which concerns him about driving back and forth and leaving her alone. He is not an operative candidate for this mass.   Physical Exam:  Filed Vitals:   05/18/14 1017  BP: 170/63  Pulse: 67  Temp: 98 F (36.7 C)  Weight: 152 lb 14.4 oz (69.355 kg)  SpO2: 99%   Pleasant elderly male. No distress. Normal respiratory effort. Alert and oriented. Walks unassisted.   IMPRESSION: Alfred Hall is a 81 y.o. male with inoperable 5.7 cm tumor.   PLAN:  We discussed the use of radiation in the treatment of inoperable node negative lung cancers. His lesion has unfortunately grown to a size where SBRT is not an option. We can offer him hypofractionated treatment ala the CALGB trial which would be 26 fractions. We discussed that he may fail in the mediastinum hilum or elsewhere in the body and radiation was a local only  process. We discussed the process of simulation and placement of tattoos. We discussed 26 treatments occurring every day as an outpatient. We discussed these treatments will last about 10-20 minutes.. We discussed that RT was unlikely to make his breathing any worse. It is also unlikely to make his breathing symptoms any better but hopefully could control this bleeding.  I would like to start as soon as possible. His wife had a few appointments that he was concerned he would miss and he wanted to start after April 15th.  We were able to talk him into starting earlier. Our social workere has given his daughter resources to contact for housing or gas cards. We discussed possible side effects including his shoulder pain due to arm positioning and possible rib fracture withthe pleural-based nodules proximity to the ribs. We discussed skin redness and fatigue We discussed damage to other critical normal structures including heart, ribs, lung collapse, chronic cough, and brachial plexus injury. We discussed that without treatment this  could develop into a more aggressive or even metastatic cancer.   He has signed informed consent and we were able to fit him in for simulation today.   I spent 40 minutes face to face with the patient and more than 50% of that time was spent in counseling and/or coordination of care.      Thea Silversmith, MD

## 2014-05-18 NOTE — Addendum Note (Signed)
Encounter addended by: Norm Salt, RN on: 05/18/2014  1:34 PM<BR>     Documentation filed: Charges VN

## 2014-05-18 NOTE — Progress Notes (Signed)
[facility] Radiation Oncology Simulation and Treatment Planning Note   Name: Alfred Hall MRN: [id number]  Date: 05/18/2014  DOB: 05-10-1929  Status: outpatient    DIAGNOSIS: The encounter diagnosis was Bronchogenic cancer of left lung.    CONSENT VERIFIED: yes   SET UP AND IMMOBILIZATION: Patient is setup supine with arms in a wing board.   NARRATIVE: The patient was brought to the [facility].  Identity was confirmed.  All relevant records and images related to the planned course of therapy were reviewed.  Then, the patient was positioned in a stable reproducible clinical set-up for radiation therapy.  CT images were obtained.  Skin markings were placed.  The CT images were loaded into the planning software where the target and avoidance structures were contoured.  The radiation prescription was entered and confirmed.   TREATMENT PLANNING NOTE:  Treatment planning then occurred. I have requested 3D simulation with [facility] of the spinal cord, total lungs and gross tumor volume. I have also requested mlcs and an isodose plan.   I have ordered a consult with the dietician for monitoring.  I will also be verifying that weekly lab values are appropriate.   A total of 5 complex treatment devices will be utilized in the form of MLCs

## 2014-05-22 ENCOUNTER — Other Ambulatory Visit: Payer: Self-pay

## 2014-05-22 ENCOUNTER — Telehealth: Payer: Self-pay

## 2014-05-22 DIAGNOSIS — R042 Hemoptysis: Secondary | ICD-10-CM

## 2014-05-22 NOTE — Telephone Encounter (Signed)
Patient's daughter Vilinda Blanks left a voice message that patient has been coughing up  about 1 cup of blood  At bedtime every night for last few night.she has not seen this is verbalized according to patient wife.She already has call placed to Dr.Gerhardts office.Dr.Wentworth thinks that is the best way to go.He is scheduled to start treatment on 05/28/14.

## 2014-05-23 ENCOUNTER — Ambulatory Visit
Admission: RE | Admit: 2014-05-23 | Discharge: 2014-05-23 | Disposition: A | Discharge status: Home / Self Care | Payer: Medicare Other | Source: Ambulatory Visit | Attending: Cardiothoracic Surgery | Admitting: Cardiothoracic Surgery

## 2014-05-23 ENCOUNTER — Ambulatory Visit (INDEPENDENT_AMBULATORY_CARE_PROVIDER_SITE_OTHER): Payer: Medicare Other | Admitting: Cardiothoracic Surgery

## 2014-05-23 ENCOUNTER — Encounter: Payer: Self-pay | Admitting: Cardiothoracic Surgery

## 2014-05-23 VITALS — BP 120/63 | HR 84 | Resp 20 | Ht 69.0 in | Wt 152.0 lb

## 2014-05-23 DIAGNOSIS — R042 Hemoptysis: Secondary | ICD-10-CM

## 2014-05-23 DIAGNOSIS — C3412 Malignant neoplasm of upper lobe, left bronchus or lung: Secondary | ICD-10-CM

## 2014-05-23 MED ORDER — AMOXICILLIN-POT CLAVULANATE 500-125 MG PO TABS: 1.0000 | ORAL_TABLET | Freq: Three times a day (TID) | ORAL | Status: DC

## 2014-05-24 DIAGNOSIS — Z510 Encounter for antineoplastic radiation therapy: Secondary | ICD-10-CM | POA: Diagnosis not present

## 2014-05-24 NOTE — Progress Notes (Signed)
Alfred Hall       Alfred Hall,Alfred Hall             [phone]      Alfred Hall Medical Record #[id number] Date of Birth: [birth year]  Referring: Sherren Mocha, MD Primary Care: Augustin Coupe, MD  Chief Complaint:   hemoptysis  12/26/2013  OPERATIVE REPORT PREOPERATIVE DIAGNOSES: Ascending aortic aneurysm, severe critical aortic stenosis, coronary artery disease, left lung mass. POSTOPERATIVE DIAGNOSES: Ascending aortic aneurysm, severe critical aortic stenosis, coronary artery disease, left lung mass. SURGICAL PROCEDURES: Supracoronary replacement of ascending aorta with Hemashield graft 32 mm, replacement of aortic valve with pericardial tissue valve 25 mm, [employer], model 3300TFX, serial [id number]; coronary artery bypass grafting with the left internal mammary to the left anterior descending coronary artery, biopsy of left lung mass. SURGEON: Lanelle Bal, MD Path: Diagnosis 1. Lung, biopsy, Left upper lobe - FIBROSIS AND INFLAMMATION. - NO MALIGNANCY IDENTIFIED. 2. Aneurysm, aortic, Ascending aortic - LARGE VESSEL WALL WITH MYXOID DEGENERATION. 3. Heart valve leaflets, Aortic valve - VALVE TISSUE WITH NODULAR CALCIFICATIONS. Vicente Males MD Pathologist, Electronic Signature  Repeat Biopsy: 04/19/2014  Diagnosis Lung, needle/core biopsy(ies), Left upper lobe mass - INVASIVE SQUAMOUS CELL CARCINOMA, SEE COMMENT. Microscopic Comment The case was reviewed by Dr Lyndon Code who concurs. There is adequate tumor for additional ancillary tumor testing. (CRR:ecj 04/19/2014) Mali RUND DO Pathologist, Electronic Signature (Case signed 04/19/2014)  History of Present Illness:      Follow-up CT scan and repeat needle biopsy done last month confirmed  the left upper lobe lung mass was enlarging in size and arrange for the patient to have a repeat CT scan of the chest, which now confirms invasive squamous cell carcinoma.  Although the patient tolerated his recent cardiac surgery he was with some difficulty, the degree of emphysema and age, and known almost 6 cm abdominal aortic aneurysm do not think he would tolerate a left upper lobectomy. He was referred to radiation therapy.  Repeat pet scan was done in [facility] and he is p[lanned to start radiation Monday 4/18 . He comes in today to  for repeat chest xray after noted some hemoptysis.  He is notes hemoptysis   , dark blood at night. First he saws a cup , but then is unclear because it was coughing in the same conatiner he urinates in. He has noted increased secretions with coughing addition, no fever or chill. Cough in office today produced no blood or sputum    Past Medical History  Diagnosis Date  . PVD (peripheral vascular disease)   . Microhematuria     a. workup negative  . Aortic aneurysm, abdominal   . Hypercholesteremia   . Hypertension   . Thoracic aortic aneurysm without rupture   . Aortic stenosis     a. ECHO at [place]- pk AV grad 77 with a mean rate of 53   . Tobacco abuse   . CAD (coronary artery disease)     a. s/p LHC on 09/18/13 with non-obs disease and severe AS  . COPD (chronic obstructive pulmonary disease)   . Hypoglycemia   . Abdominal aortic aneurysm      History  Smoking status  . Former Smoker -- 0.50 packs/day for 60 years  . Types: Cigarettes  . Quit date: 12/22/2013  Smokeless tobacco  . Never Used    History  Alcohol Use  . 0.0 oz/week  . 0 Standard drinks or equivalent  per week    Comment: " occasional beer"     No Known Allergies  Current Outpatient Prescriptions  Medication Sig Dispense Refill  . amiodarone (PACERONE) 200 MG tablet Take 1 tablet (200 mg total) by mouth daily. 31 tablet 6  . atorvastatin (LIPITOR) 20 MG tablet Take 1 tablet (20 mg total) by mouth daily at 6 PM. 31 tablet 6  . cholecalciferol (VITAMIN D) 1000 UNITS tablet Take 1,000 Units by mouth daily.     . Garlic 314 MG CAPS Take 300 mg  by mouth daily.     . metoprolol tartrate (LOPRESSOR) 25 MG tablet Take 0.5 tablets (12.5 mg total) by mouth 2 (two) times daily. 62 tablet 6  . Multiple Vitamins-Minerals (CENTRUM SILVER ADULT 50+ PO) Take 1 tablet by mouth daily.    Marland Kitchen amoxicillin-clavulanate (AUGMENTIN) 500-125 MG per tablet Take 1 tablet (500 mg total) by mouth 3 (three) times daily. 30 tablet 0   No current facility-administered medications for this visit.       Physical Exam: BP 120/63 mmHg  Pulse 84  Resp 20  Ht 5\' 9"  (1.753 m)  Wt 152 lb (68.947 kg)  BMI 22.44 kg/m2  SpO2 97%  General appearance: alert and cooperative Neurologic: intact Heart: regular rate and rhythm, S1, S2 normal, no murmur, click, rub or gallop Lungs: clear to auscultation bilaterally Abdomen: soft, non-tender; bowel sounds normal;  non tender palpable AAA  no organomegaly Extremities: extremities normal, atraumatic, no cyanosis or edema and Homans sign is negative, no sign of DVT Wound: well healed sternum, and chest tube sites  Diagnostic Studies & Laboratory data:     Recent Radiology Findings:  Dg Chest 2 View  05/23/2014   CLINICAL DATA:  Cough with hemoptysis for 1 week  EXAM: CHEST  2 VIEW  COMPARISON:  04/18/2014  FINDINGS: There is a persistent spiculated left upper lobe pulmonary mass measuring approximately 7.2 cm with mild surrounding pneumonitis. There is no pneumothorax. There is no pleural effusion. The lungs are hyperinflated. Prior median sternotomy and aortic valve replacement.  The osseous structures are unremarkable.  IMPRESSION: 1. Persistent spiculated left upper lobe pulmonary mass with mild surrounding pneumonitis. The mass is increased in size compared with 04/18/2014.   Electronically Signed   By: Kathreen Devoid   On: 05/23/2014 14:20    Dg Chest 2 View  04/19/2014   CLINICAL DATA:  Recent biopsy, evaluate for pneumothorax  EXAM: CHEST  2 VIEW  COMPARISON:  Portable chest x-ray of 04/18/2014 and CT chest of  03/26/2014  FINDINGS: The large left upper lobe mass is again noted consistent with primary lung carcinoma. A left pleural effusion is present. The lungs are hyperaerated consistent with emphysema. The heart is mildly enlarged and stable. Median sternotomy sutures are noted from prior aortic valve replacement.  IMPRESSION: 1. No change in large left upper lobe mass consistent with primary lung carcinoma. 2. Small left pleural effusion. 3. Emphysema.   Electronically Signed   By: Ivar Drape M.D.   On: 04/19/2014 07:52   Ct Biopsy  04/18/2014   CLINICAL DATA:  81 year old male with a left upper lobe mass. That the mass has been previously biopsied with inflammatory cells present. Has been referred for a repeat biopsy.  EXAM: CT GUIDED CORE BIOPSY OF LEFT UPPER LOBE MASS  ANESTHESIA/SEDATION: 1.5  Mg IV Versed; 75 mcg IV Fentanyl  Total Moderate Sedation Time: 19 minutes.  PROCEDURE: The procedure risks, benefits, and alternatives were explained to the  patient. Questions regarding the procedure were encouraged and answered. The patient understands and consents to the procedure.  Scout CT of the chest performed for surgical planning purposes.  The left lateral upper chest was prepped with Betadinein a sterile fashion, and a sterile drape was applied covering the operative field. A sterile gown and sterile gloves were used for the procedure. Local anesthesia was provided with 1% Lidocaine.  Once the patient is prepped and draped sterilely, the skin and subcutaneous tissues were generously infiltrated with 1% lidocaine without epinephrine.  Using CT guidance, a 17 gauge guide needle was advanced into the mass of the left upper lobe. Four separate 18 gauge core biopsy were then retrieved.  Before withdrawing the guide needle, a BioSentry device was deployed for decreasing the risk for pneumothorax development.  Once the needle was removed a final CT was performed.  The patient tolerated the procedure well and remained  hemodynamically stable throughout.  No complications were encountered and no significant blood loss was encountered.  COMPLICATIONS: None  FINDINGS: CT of the chest demonstrates mass of the left upper lobe, with a background of severe emphysema.  Images during the case demonstrate safe placement of the tip of 17 gauge guide needle into the anterior aspect of left upper lobe mass.  Status post biopsy, CT scan demonstrates no evidence of pneumothorax. Expected small amount of hemorrhage along the lateral aspect of the mass.  IMPRESSION: Status post CT-guided biopsy of left upper lobe mass. Tissue specimen sent to pathology for complete histopathologic analysis.  Signed,  Dulcy Fanny. Earleen Newport, DO  [facility]  [facility]   Electronically Signed   By: Corrie Mckusick D.O.   On: 04/18/2014 12:49   Dg Chest Port 1 View  04/18/2014   CLINICAL DATA:  81 year old male with a history of left upper lobe lung lesion, status post biopsy.  EXAM: PORTABLE CHEST - 1 VIEW  COMPARISON:  CT biopsy 04/18/2014, prior CT chest 03/26/2014, plain film 02/27/2014  FINDINGS: Cardiomediastinal silhouette within normal limits in size and contour. Atherosclerotic calcifications of the aortic arch.  Surgical changes of prior median sternotomy and aortic valve replacement.  Rounded density within the left upper lung, compatible with known lung mass.  Vague lucency along the left heart border. No evidence of large pleural fluid.  Right lung well aerated.  IMPRESSION: Status post left lung biopsy there is suspicion of small anterior pneumothorax.  Left upper lobe lung mass, previously demonstrated on the cross-sectional imaging, status post biopsy.  Surgical changes of median sternotomy and aortic valve replacement.  Atherosclerosis.  Signed,  Dulcy Fanny. Earleen Newport, DO  [facility]  [facility]   Electronically Signed   By: Corrie Mckusick D.O.   On: 04/18/2014 15:31     Ct Angio Chest Aorta W/cm &/or Wo/cm  03/26/2014   CLINICAL DATA:  Thoracoabdominal aortic aneurysm.  EXAM: CT ANGIOGRAPHY CHEST, ABDOMEN AND PELVIS  TECHNIQUE: Multidetector CT imaging through the chest, abdomen and pelvis was performed using the standard protocol during bolus administration of intravenous contrast. Multiplanar reconstructed images and MIPs were obtained and reviewed to evaluate the vascular anatomy.  CONTRAST:  98mL OMNIPAQUE IOHEXOL 350 MG/ML SOLN  COMPARISON:  CT scan of chest of September 29, 2013.  FINDINGS: CTA CHEST FINDINGS  No pneumothorax is noted. Minimal left pleural effusion is noted. Emphysematous disease is noted in the upper lobes bilaterally. 4.5 x 3.7 cm mass is noted posteriorly in the left upper lobe  which is significantly increased in size compared to prior exam and therefore concerning for malignancy.  There is no evidence of thoracic aortic dissection. Moderate atheromatous disease and calcified plaque is noted in the descending thoracic aorta. Status post aortic valve repair. 4.2 cm ascending aortic aneurysm is noted. Transverse aortic arch measures 2.8 cm 2.7 cm in diameter. Proximal portion of descending thoracic aorta measures 3.3 cm in diameter. Distal portion of descending thoracic aorta measures 3.0 cm. Great vessels are widely pain without significant stenosis.  Review of the MIP images confirms the above findings.  CTA ABDOMEN AND PELVIS FINDINGS  Cholelithiasis is noted. No focal abnormality is noted in the liver, spleen or pancreas. Adrenal glands appear normal. Bilateral renal cysts are noted with the largest measuring 5.3 cm. No hydronephrosis or renal obstruction is noted. There is no evidence of bowel obstruction. Urinary bladder appears normal. No abnormal fluid collection is noted.  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 Superior mesenteric and bilateral renal arteries are widely patent. Two renal arteries  are noted on the right. Infrarenal abdominal aortic aneurysm with mural thrombus is noted, measuring 5.7 cm in maximum AP diameter and 5.4 cm in maximum transverse diameter. It extends to the aortic bifurcation. Iliac arteries are widely patent, without significant aneurysmal dilatation but extensive calcifications. Infrarenal neck measures less than 1 cm in length with minor axis of 17 mm. No dissection is noted.  Review of the MIP images confirms the above findings.  IMPRESSION: 4.5 x 3.7 cm left upper lobe mass is noted which is significantly increased compared to prior exam, and concerning for malignancy.  4.2 cm ascending thoracic aortic aneurysm is noted with dilatation of the descending thoracic noted as well.  Cholelithiasis.  Infrarenal abdominal aortic aneurysm is noted with mural thrombus which measures 5.7 x 5.4 cm in size.  No dissection is noted in the thoracic or abdominal aorta.  These results will be called to the ordering clinician or representative by the Radiologist Assistant, and communication documented in the PACS or zVision Dashboard.   Electronically Signed   By: Sabino Dick M.D.   On: 03/26/2014 14:14   I have independently reviewed the above radiology studies  and reviewed the findings with the patient.   Recent Lab Findings: Lab Results  Component Value Date   WBC 9.8 04/18/2014   HGB 12.0* 04/18/2014   HCT 37.7* 04/18/2014   PLT 263 04/18/2014   GLUCOSE 96 01/24/2014   ALT 11 12/28/2013   AST 35 12/28/2013   NA 130* 01/24/2014   K 4.3 01/24/2014   CL 91* 01/24/2014   CREATININE 1.4 01/24/2014   BUN 20 01/24/2014   CO2 32 01/24/2014   INR 1.11 04/18/2014   HGBA1C 6.0* 12/22/2013      Assessment / Plan:  Biopsy confirmed   INVASIVE SQUAMOUS CELL CARCINOMA of the left upper lobe. Previous biopsy negative x2   Patient  saw Dr. Pablo Ledger and will start radiation therapy  4/18  Patient and daughter will monitor closely what he is coughing up. With area of  "pneumionitis ' and some increased secretions will start course of Augmentin and hold ASA for now  Being Evaluated by  Dr Trula Slade for treatment of AAA-   I  spent 35 minutes counseling the patient face to face and 50% or more the  time was spent in counseling and coordination of care. The total time spent in the appointment was 50 minutes Plan  to see the patient back in 6 weeks.  Grace Isaac MD      [address] [place] [address] Office [phone]   Beeper [phone]  05/24/2014 9:18 AM

## 2014-05-28 ENCOUNTER — Ambulatory Visit: Payer: Medicare Other | Attending: Radiation Oncology

## 2014-05-28 ENCOUNTER — Ambulatory Visit
Admission: RE | Admit: 2014-05-28 | Discharge: 2014-05-28 | Disposition: A | Discharge status: Home / Self Care | Payer: Medicare Other | Source: Ambulatory Visit | Attending: Radiation Oncology | Admitting: Radiation Oncology

## 2014-05-28 DIAGNOSIS — Z510 Encounter for antineoplastic radiation therapy: Secondary | ICD-10-CM | POA: Diagnosis not present

## 2014-05-29 ENCOUNTER — Ambulatory Visit
Admission: RE | Admit: 2014-05-29 | Discharge: 2014-05-29 | Disposition: A | Discharge status: Home / Self Care | Payer: Medicare Other | Source: Ambulatory Visit | Attending: Radiation Oncology | Admitting: Radiation Oncology

## 2014-05-29 VITALS — BP 137/54 | HR 69 | Temp 98.0°F | Wt 154.5 lb

## 2014-05-29 DIAGNOSIS — C3492 Malignant neoplasm of unspecified part of left bronchus or lung: Secondary | ICD-10-CM

## 2014-05-29 DIAGNOSIS — Z510 Encounter for antineoplastic radiation therapy: Secondary | ICD-10-CM | POA: Diagnosis not present

## 2014-05-29 NOTE — Progress Notes (Signed)
Pt here for patient teaching.  Pt given . Reviewed areas of pertinence such as fatigue, hair loss, skin changes, throat changes, cough, shortness of breath, earaches and taste changes . Pt able to give teach back of to pat skin, use unscented/gentle soap and drink plenty of water,apply Radiaplex bid and avoid applying anything to skin within 4 hours of treatment. Pt demonstrated understanding, needs reinforcement and verbalizes understanding of information given and will contact nursing with any questions or concerns. Completed 2 of 26 treatments to left lung.Hemoptysis has cleared.Aspirin discontinued and patient started on anti-biotic therapy by Dr.Gerhardt.

## 2014-05-29 NOTE — Progress Notes (Signed)
Weekly Management Note Current Dose: 5.4  Gy  Projected Dose: 70.2 Gy   Narrative:  The patient presents for routine under treatment assessment.  CBCT/MVCT images/Port film x-rays were reviewed.  The chart was checked. Doing well. No complaints. No bleeding this week. Staying with granddaughter.  Physical Findings: Weight: 154 lb 8 oz (70.081 kg). Unchanged  Impression:  The patient is tolerating radiation.  Plan:  Continue treatment as planned.

## 2014-05-30 ENCOUNTER — Ambulatory Visit
Admission: RE | Admit: 2014-05-30 | Discharge: 2014-05-30 | Disposition: A | Discharge status: Home / Self Care | Payer: Medicare Other | Source: Ambulatory Visit | Attending: Radiation Oncology | Admitting: Radiation Oncology

## 2014-05-30 DIAGNOSIS — Z510 Encounter for antineoplastic radiation therapy: Secondary | ICD-10-CM | POA: Diagnosis not present

## 2014-05-31 ENCOUNTER — Ambulatory Visit
Admission: RE | Admit: 2014-05-31 | Discharge: 2014-05-31 | Disposition: A | Discharge status: Home / Self Care | Payer: Medicare Other | Source: Ambulatory Visit | Attending: Radiation Oncology | Admitting: Radiation Oncology

## 2014-05-31 DIAGNOSIS — Z510 Encounter for antineoplastic radiation therapy: Secondary | ICD-10-CM | POA: Diagnosis not present

## 2014-06-01 ENCOUNTER — Ambulatory Visit
Admission: RE | Admit: 2014-06-01 | Discharge: 2014-06-01 | Disposition: A | Discharge status: Home / Self Care | Payer: Medicare Other | Source: Ambulatory Visit | Attending: Radiation Oncology | Admitting: Radiation Oncology

## 2014-06-01 DIAGNOSIS — Z510 Encounter for antineoplastic radiation therapy: Secondary | ICD-10-CM | POA: Diagnosis not present

## 2014-06-04 ENCOUNTER — Ambulatory Visit: Payer: Medicare Other | Admitting: Radiation Oncology

## 2014-06-04 ENCOUNTER — Ambulatory Visit
Admission: RE | Admit: 2014-06-04 | Discharge: 2014-06-04 | Disposition: A | Discharge status: Home / Self Care | Payer: Medicare Other | Source: Ambulatory Visit | Attending: Radiation Oncology | Admitting: Radiation Oncology

## 2014-06-04 DIAGNOSIS — Z510 Encounter for antineoplastic radiation therapy: Secondary | ICD-10-CM | POA: Diagnosis not present

## 2014-06-05 ENCOUNTER — Ambulatory Visit
Admission: RE | Admit: 2014-06-05 | Discharge: 2014-06-05 | Disposition: A | Discharge status: Home / Self Care | Payer: Medicare Other | Source: Ambulatory Visit | Attending: Radiation Oncology | Admitting: Radiation Oncology

## 2014-06-05 VITALS — BP 143/54 | HR 70 | Temp 97.9°F | Wt 151.1 lb

## 2014-06-05 DIAGNOSIS — C3492 Malignant neoplasm of unspecified part of left bronchus or lung: Secondary | ICD-10-CM

## 2014-06-05 DIAGNOSIS — Z510 Encounter for antineoplastic radiation therapy: Secondary | ICD-10-CM | POA: Diagnosis not present

## 2014-06-05 NOTE — Progress Notes (Signed)
Weekly Management Note Current Dose: 18.9Gy  Projected Dose:70.2 Gy   Narrative:  The patient presents for routine under treatment assessment.  CBCT/MVCT images/Port film x-rays were reviewed.  The chart was checked. Doing ok.  No hemoptysis. Enjoying living in [place].   Physical Findings:  Unchanged. No skin changes on back.   Vitals:  Filed Vitals:   06/05/14 1602  BP: 143/54  Pulse: 70  Temp: 97.9 F (36.6 C)   Weight:  Wt Readings from Last 3 Encounters:  06/05/14 151 lb 1.6 oz (68.539 kg)  05/29/14 154 lb 8 oz (70.081 kg)  05/23/14 152 lb (68.947 kg)   Lab Results  Component Value Date   WBC 9.8 04/18/2014   HGB 12.0* 04/18/2014   HCT 37.7* 04/18/2014   MCV 82.3 04/18/2014   PLT 263 04/18/2014   Lab Results  Component Value Date   CREATININE 1.4 01/24/2014   BUN 20 01/24/2014   NA 130* 01/24/2014   K 4.3 01/24/2014   CL 91* 01/24/2014   CO2 32 01/24/2014     Impression:  The patient is tolerating radiation.  Plan:  Continue treatment as planned. Continue biafene.

## 2014-06-06 ENCOUNTER — Ambulatory Visit
Admission: RE | Admit: 2014-06-06 | Discharge: 2014-06-06 | Disposition: A | Discharge status: Home / Self Care | Payer: Medicare Other | Source: Ambulatory Visit | Attending: Radiation Oncology | Admitting: Radiation Oncology

## 2014-06-06 DIAGNOSIS — Z510 Encounter for antineoplastic radiation therapy: Secondary | ICD-10-CM | POA: Diagnosis not present

## 2014-06-07 ENCOUNTER — Ambulatory Visit: Admission: RE | Admit: 2014-06-07 | Payer: Medicare Other | Source: Ambulatory Visit

## 2014-06-07 ENCOUNTER — Ambulatory Visit: Payer: Medicare Other | Admitting: Cardiothoracic Surgery

## 2014-06-08 ENCOUNTER — Ambulatory Visit
Admission: RE | Admit: 2014-06-08 | Discharge: 2014-06-08 | Disposition: A | Discharge status: Home / Self Care | Payer: Medicare Other | Source: Ambulatory Visit | Attending: Radiation Oncology | Admitting: Radiation Oncology

## 2014-06-08 DIAGNOSIS — Z510 Encounter for antineoplastic radiation therapy: Secondary | ICD-10-CM | POA: Diagnosis not present

## 2014-06-11 ENCOUNTER — Ambulatory Visit
Admission: RE | Admit: 2014-06-11 | Discharge: 2014-06-11 | Disposition: A | Discharge status: Home / Self Care | Payer: Medicare Other | Source: Ambulatory Visit | Attending: Radiation Oncology | Admitting: Radiation Oncology

## 2014-06-11 DIAGNOSIS — Z510 Encounter for antineoplastic radiation therapy: Secondary | ICD-10-CM | POA: Diagnosis not present

## 2014-06-12 ENCOUNTER — Ambulatory Visit
Admission: RE | Admit: 2014-06-12 | Discharge: 2014-06-12 | Disposition: A | Discharge status: Home / Self Care | Payer: Medicare Other | Source: Ambulatory Visit | Attending: Radiation Oncology | Admitting: Radiation Oncology

## 2014-06-12 VITALS — BP 145/57 | HR 63 | Temp 98.3°F | Wt 152.9 lb

## 2014-06-12 DIAGNOSIS — Z510 Encounter for antineoplastic radiation therapy: Secondary | ICD-10-CM | POA: Diagnosis not present

## 2014-06-12 DIAGNOSIS — C3492 Malignant neoplasm of unspecified part of left bronchus or lung: Secondary | ICD-10-CM

## 2014-06-12 NOTE — Progress Notes (Signed)
Completed 11 of 26 treatments to left lung.No skin changes.Reinforced when to apply radiaplex .Told to wait until he has skin changes as he states he has been itching.Denies pain, shortness of breath or cough.

## 2014-06-12 NOTE — Progress Notes (Signed)
Weekly Management Note Current Dose: 29.7 Gy  Projected Dose: 70.2 Gy   Narrative:  The patient presents for routine under treatment assessment.  CBCT/MVCT images/Port film x-rays were reviewed.  The chart was checked. Doing well. Some throat clearing. No hemoptysis. Had a noseblled this AM. No dysphagia. Back pain is better.   Physical Findings:  Unchanged  Vitals:  Filed Vitals:   06/12/14 1558  BP: 145/57  Pulse: 63  Temp: 98.3 F (36.8 C)   Weight:  Wt Readings from Last 3 Encounters:  06/12/14 152 lb 14.4 oz (69.355 kg)  06/05/14 151 lb 1.6 oz (68.539 kg)  05/29/14 154 lb 8 oz (70.081 kg)   Lab Results  Component Value Date   WBC 9.8 04/18/2014   HGB 12.0* 04/18/2014   HCT 37.7* 04/18/2014   MCV 82.3 04/18/2014   PLT 263 04/18/2014   Lab Results  Component Value Date   CREATININE 1.4 01/24/2014   BUN 20 01/24/2014   NA 130* 01/24/2014   K 4.3 01/24/2014   CL 91* 01/24/2014   CO2 32 01/24/2014     Impression:  The patient is tolerating radiation.  Plan:  Continue treatment as planned.

## 2014-06-13 ENCOUNTER — Ambulatory Visit
Admission: RE | Admit: 2014-06-13 | Discharge: 2014-06-13 | Disposition: A | Discharge status: Home / Self Care | Payer: Medicare Other | Source: Ambulatory Visit | Attending: Radiation Oncology | Admitting: Radiation Oncology

## 2014-06-13 DIAGNOSIS — Z510 Encounter for antineoplastic radiation therapy: Secondary | ICD-10-CM | POA: Diagnosis not present

## 2014-06-14 ENCOUNTER — Ambulatory Visit: Payer: Medicare Other | Admitting: Cardiothoracic Surgery

## 2014-06-14 ENCOUNTER — Ambulatory Visit
Admission: RE | Admit: 2014-06-14 | Discharge: 2014-06-14 | Disposition: A | Discharge status: Home / Self Care | Payer: Medicare Other | Source: Ambulatory Visit | Attending: Radiation Oncology | Admitting: Radiation Oncology

## 2014-06-14 DIAGNOSIS — Z510 Encounter for antineoplastic radiation therapy: Secondary | ICD-10-CM | POA: Diagnosis not present

## 2014-06-15 ENCOUNTER — Ambulatory Visit: Payer: Medicare Other | Admitting: Cardiothoracic Surgery

## 2014-06-15 ENCOUNTER — Ambulatory Visit
Admission: RE | Admit: 2014-06-15 | Discharge: 2014-06-15 | Disposition: A | Discharge status: Home / Self Care | Payer: Medicare Other | Source: Ambulatory Visit | Attending: Radiation Oncology | Admitting: Radiation Oncology

## 2014-06-15 ENCOUNTER — Ambulatory Visit: Payer: Medicare Other | Admitting: Cardiovascular Disease

## 2014-06-15 DIAGNOSIS — Z510 Encounter for antineoplastic radiation therapy: Secondary | ICD-10-CM | POA: Diagnosis not present

## 2014-06-18 ENCOUNTER — Ambulatory Visit
Admission: RE | Admit: 2014-06-18 | Discharge: 2014-06-18 | Disposition: A | Discharge status: Home / Self Care | Payer: Medicare Other | Source: Ambulatory Visit | Attending: Radiation Oncology | Admitting: Radiation Oncology

## 2014-06-18 DIAGNOSIS — Z510 Encounter for antineoplastic radiation therapy: Secondary | ICD-10-CM | POA: Diagnosis not present

## 2014-06-19 ENCOUNTER — Ambulatory Visit
Admission: RE | Admit: 2014-06-19 | Discharge: 2014-06-19 | Disposition: A | Discharge status: Home / Self Care | Payer: Medicare Other | Source: Ambulatory Visit | Attending: Radiation Oncology | Admitting: Radiation Oncology

## 2014-06-19 ENCOUNTER — Encounter: Payer: Self-pay | Admitting: Radiation Oncology

## 2014-06-19 VITALS — BP 130/55 | HR 59 | Temp 98.0°F | Resp 20 | Ht 69.0 in | Wt 151.4 lb

## 2014-06-19 DIAGNOSIS — Z510 Encounter for antineoplastic radiation therapy: Secondary | ICD-10-CM | POA: Diagnosis not present

## 2014-06-19 DIAGNOSIS — C3492 Malignant neoplasm of unspecified part of left bronchus or lung: Secondary | ICD-10-CM

## 2014-06-19 NOTE — Progress Notes (Signed)
Alfred Hall has completed 16 fractions to his left lung.  He denies pain and a sore throat.  He reports clearing his throat frequently since his heart surgery.  He denies cough and hemoptysis.  He reports shortness of breath with walking in up steps.  He reports having itching areas on his back and chest.  Redness noted on his left upper back and chest.  He is using radiaplex.  He denies fatigue.  BP 130/55 mmHg  Pulse 59  Temp(Src) 98 F (36.7 C) (Oral)  Resp 20  Ht '5\' 9"'$  (1.753 m)  Wt 151 lb 6.4 oz (68.675 kg)  BMI 22.35 kg/m2  SpO2 100%

## 2014-06-20 ENCOUNTER — Ambulatory Visit
Admission: RE | Admit: 2014-06-20 | Discharge: 2014-06-20 | Disposition: A | Discharge status: Home / Self Care | Payer: Medicare Other | Source: Ambulatory Visit | Attending: Radiation Oncology | Admitting: Radiation Oncology

## 2014-06-20 DIAGNOSIS — Z510 Encounter for antineoplastic radiation therapy: Secondary | ICD-10-CM | POA: Diagnosis not present

## 2014-06-20 NOTE — Progress Notes (Signed)
Weekly Management Note Current Dose:  43.2 Gy  Projected Dose: 70.2 Gy   Narrative:  The patient presents for routine under treatment assessment.  CBCT/MVCT images/Port film x-rays were reviewed.  The chart was checked. Doing well. No complaints. No hemoptysis.   Physical Findings: Weight: 151 lb 6.4 oz (68.675 kg). Unchanged  Impression:  The patient is tolerating radiation.  Plan:  Continue treatment as planned.

## 2014-06-21 ENCOUNTER — Ambulatory Visit
Admission: RE | Admit: 2014-06-21 | Discharge: 2014-06-21 | Disposition: A | Discharge status: Home / Self Care | Payer: Medicare Other | Source: Ambulatory Visit | Attending: Radiation Oncology | Admitting: Radiation Oncology

## 2014-06-21 DIAGNOSIS — Z510 Encounter for antineoplastic radiation therapy: Secondary | ICD-10-CM | POA: Diagnosis not present

## 2014-06-22 ENCOUNTER — Ambulatory Visit
Admission: RE | Admit: 2014-06-22 | Discharge: 2014-06-22 | Disposition: A | Discharge status: Home / Self Care | Payer: Medicare Other | Source: Ambulatory Visit | Attending: Radiation Oncology | Admitting: Radiation Oncology

## 2014-06-22 DIAGNOSIS — Z510 Encounter for antineoplastic radiation therapy: Secondary | ICD-10-CM | POA: Diagnosis not present

## 2014-06-25 ENCOUNTER — Ambulatory Visit
Admission: RE | Admit: 2014-06-25 | Discharge: 2014-06-25 | Disposition: A | Discharge status: Home / Self Care | Payer: Medicare Other | Source: Ambulatory Visit | Attending: Radiation Oncology | Admitting: Radiation Oncology

## 2014-06-25 DIAGNOSIS — Z510 Encounter for antineoplastic radiation therapy: Secondary | ICD-10-CM | POA: Diagnosis not present

## 2014-06-26 ENCOUNTER — Ambulatory Visit
Admission: RE | Admit: 2014-06-26 | Discharge: 2014-06-26 | Disposition: A | Discharge status: Home / Self Care | Payer: Medicare Other | Source: Ambulatory Visit | Attending: Radiation Oncology | Admitting: Radiation Oncology

## 2014-06-26 ENCOUNTER — Encounter: Payer: Self-pay | Admitting: Radiation Oncology

## 2014-06-26 VITALS — BP 152/54 | HR 61 | Temp 97.7°F | Resp 20 | Wt 152.1 lb

## 2014-06-26 DIAGNOSIS — C3492 Malignant neoplasm of unspecified part of left bronchus or lung: Secondary | ICD-10-CM

## 2014-06-26 DIAGNOSIS — Z510 Encounter for antineoplastic radiation therapy: Secondary | ICD-10-CM | POA: Diagnosis not present

## 2014-06-26 NOTE — Progress Notes (Signed)
Weekly rad txs left lung, 21 completed , congestive cough, no c/o pain or nausea or difficulty swallowing food'/fluids, no skin changes, using radiaplex  cream bid, appetite good,sob when walking to post office,  3:39 PM  BP 152/54 mmHg  Pulse 61  Temp(Src) 97.7 F (36.5 C) (Oral)  Resp 20  Wt 152 lb 1.6 oz (68.992 kg)  SpO2 100%  Wt Readings from Last 3 Encounters:  06/12/14 152 lb 14.4 oz (69.355 kg)  06/05/14 151 lb 1.6 oz (68.539 kg)  05/29/14 154 lb 8 oz (70.081 kg)

## 2014-06-26 NOTE — Progress Notes (Signed)
Weekly Management Note Current Dose: 56.7  Gy  Projected Dose: 70.2 Gy   Narrative:  The patient presents for routine under treatment assessment.  CBCT/MVCT images/Port film x-rays were reviewed.  The chart was checked. Doing well. No complaints. No bleeding.   Physical Findings: Weight: 152 lb 1.6 oz (68.992 kg). Unchanged skin of chest. Alert and oriented.   Impression:  The patient is tolerating radiation.  Plan:  Continue treatment as planned. Follow up in 1 month. Then scans every 3-4 months with follow up for 1 year.

## 2014-06-27 ENCOUNTER — Ambulatory Visit
Admission: RE | Admit: 2014-06-27 | Discharge: 2014-06-27 | Disposition: A | Discharge status: Home / Self Care | Payer: Medicare Other | Source: Ambulatory Visit | Attending: Radiation Oncology | Admitting: Radiation Oncology

## 2014-06-27 DIAGNOSIS — Z510 Encounter for antineoplastic radiation therapy: Secondary | ICD-10-CM | POA: Diagnosis not present

## 2014-06-28 ENCOUNTER — Ambulatory Visit
Admission: RE | Admit: 2014-06-28 | Discharge: 2014-06-28 | Disposition: A | Discharge status: Home / Self Care | Payer: Medicare Other | Source: Ambulatory Visit | Attending: Radiation Oncology | Admitting: Radiation Oncology

## 2014-06-28 DIAGNOSIS — Z510 Encounter for antineoplastic radiation therapy: Secondary | ICD-10-CM | POA: Diagnosis not present

## 2014-06-29 ENCOUNTER — Ambulatory Visit
Admission: RE | Admit: 2014-06-29 | Discharge: 2014-06-29 | Disposition: A | Discharge status: Home / Self Care | Payer: Medicare Other | Source: Ambulatory Visit | Attending: Radiation Oncology | Admitting: Radiation Oncology

## 2014-06-29 DIAGNOSIS — Z510 Encounter for antineoplastic radiation therapy: Secondary | ICD-10-CM | POA: Diagnosis not present

## 2014-07-02 ENCOUNTER — Ambulatory Visit
Admission: RE | Admit: 2014-07-02 | Discharge: 2014-07-02 | Disposition: A | Discharge status: Home / Self Care | Payer: Medicare Other | Source: Ambulatory Visit | Attending: Radiation Oncology | Admitting: Radiation Oncology

## 2014-07-02 ENCOUNTER — Ambulatory Visit: Payer: Medicare Other

## 2014-07-02 DIAGNOSIS — Z510 Encounter for antineoplastic radiation therapy: Secondary | ICD-10-CM | POA: Diagnosis not present

## 2014-07-03 ENCOUNTER — Ambulatory Visit
Admission: RE | Admit: 2014-07-03 | Discharge: 2014-07-03 | Disposition: A | Discharge status: Home / Self Care | Payer: Medicare Other | Source: Ambulatory Visit | Attending: Radiation Oncology | Admitting: Radiation Oncology

## 2014-07-03 ENCOUNTER — Ambulatory Visit: Payer: Medicare Other

## 2014-07-03 ENCOUNTER — Encounter: Payer: Self-pay | Admitting: Radiation Oncology

## 2014-07-03 DIAGNOSIS — Z510 Encounter for antineoplastic radiation therapy: Secondary | ICD-10-CM | POA: Diagnosis not present

## 2014-07-04 ENCOUNTER — Telehealth: Payer: Self-pay | Admitting: Surgery

## 2014-07-04 ENCOUNTER — Ambulatory Visit: Payer: Medicare Other

## 2014-07-04 NOTE — Telephone Encounter (Addendum)
-----   Message from Serafina Mitchell, MD sent at 07/02/2014  9:29 PM EDT ----- Please schedule him for an office visit to discuss AAA repair.  He finishes XRT for lung Ca tomorrow.  I can see him within 2-4 weeks.  Thanks  Alfred Hall   notified patient of appt. with dr. Trula Slade on 07-23-14 at 1pm, mailed np letter

## 2014-07-05 ENCOUNTER — Ambulatory Visit: Payer: Medicare Other

## 2014-07-06 ENCOUNTER — Ambulatory Visit: Payer: Medicare Other

## 2014-07-08 NOTE — Progress Notes (Signed)
  Radiation Oncology         [phone] ________________________________  Name: Alfred Hall MRN: [id number]  Date: 07/03/2014  DOB: January 02, 1930  End of Treatment Note  Diagnosis:   Bronchogenic cancer of left lung   Staging form: Lung, AJCC 7th Edition     Clinical: T2, N0, M0 - Signed by Thea Silversmith, MD on 05/18/2014     Indication for treatment:  Curative  Radiation treatment dates:   05/28/2014-07/03/18  Site/dose:   Left upper lung/ 70.2 Gy at 2.7 Gy per fractions x 26 fractions  Beams/energy:  3D conformal plan with 6 and 10 MV photons  Narrative: The patient tolerated radiation treatment relatively well.   He had minimal skin changes and no dysphagia.   Plan: The patient has completed radiation treatment. The patient will return to radiation oncology clinic for routine followup in one month. I advised them to call or return sooner if they have any questions or concerns related to their recovery or treatment.  ------------------------------------------------  Thea Silversmith, MD

## 2014-07-09 ENCOUNTER — Ambulatory Visit: Payer: Medicare Other

## 2014-07-19 ENCOUNTER — Ambulatory Visit
Admission: RE | Admit: 2014-07-19 | Discharge: 2014-07-19 | Disposition: A | Discharge status: Home / Self Care | Payer: Medicare Other | Source: Ambulatory Visit | Attending: Radiation Oncology | Admitting: Radiation Oncology

## 2014-07-19 VITALS — BP 149/63 | HR 65 | Temp 97.9°F | Wt 155.6 lb

## 2014-07-19 DIAGNOSIS — C3492 Malignant neoplasm of unspecified part of left bronchus or lung: Secondary | ICD-10-CM | POA: Diagnosis present

## 2014-07-19 MED ORDER — RADIAPLEXRX EX GEL
Freq: Once | CUTANEOUS | Status: AC
  Administered 2014-07-19: 13:00:00 via TOPICAL

## 2014-07-19 NOTE — Progress Notes (Signed)
   Department of Radiation Oncology  Phone:  [phone] Fax:        [phone]   Name: Alfred Hall MRN: [id number]  DOB: 1929/05/07  Date: 07/19/2014  Follow Up Visit Note  Diagnosis: Bronchogenic cancer of left lung   Staging form: Lung, AJCC 7th Edition     Clinical: T2, N0, M0 - Signed by Thea Silversmith, MD on 05/18/2014  Summary and Interval since last radiation: Left upper lung/ 70.2 Gy at 2.7 Gy per fractions x 26 fractions completed 06/2014   Interval History: Alfred Hall presents today for routine followup. He has not had any further hemoptysis. He had some skin changes. He has stable cough. He has no dysphagia and good appetite.   Physical Exam:  Filed Vitals:   07/19/14 1252  BP: 149/63  Pulse: 65  Temp: 97.9 F (36.6 C)  Weight: 155 lb 9.6 oz (70.58 kg)   Healing dry desquamation over the posterior left chest.   IMPRESSION:  T2N0 Squamous Cell Carcinoma of the left lung. He is post statis radiation  PLAN:  Patient is scheduled for a scan in two weeks. He can have the scan done closer to his home prior to the appt scheduled with Dr. Servando Snare on August 23, 2014. I would like to see the patient back in 4 months for f/u and CT scan. Encourage to contact radiation oncology with any bleeding issues.   Thea Silversmith, MD  This document serves as a record of services personally performed by Thea Silversmith , MD. It was created on her behalf by Jeralene Peters, a trained medical scribe. The creation of this record is based on the scribe's personal observations and the provider's statements to them. This document has been checked and approved by the attending provider.

## 2014-07-23 ENCOUNTER — Ambulatory Visit (INDEPENDENT_AMBULATORY_CARE_PROVIDER_SITE_OTHER): Payer: Medicare Other | Admitting: Surgery

## 2014-07-23 VITALS — BP 156/68 | HR 92 | Resp 16 | Ht 68.5 in | Wt 155.0 lb

## 2014-07-23 DIAGNOSIS — I714 Abdominal aortic aneurysm, without rupture, unspecified: Secondary | ICD-10-CM

## 2014-07-23 NOTE — Progress Notes (Signed)
Patient name: Alfred Hall MRN: [id number] DOB: 1930-01-03 Sex: male    No chief complaint on file.   HISTORY OF PRESENT ILLNESS: The patient is back for follow-up.  He was being evaluated for a fenestrated endovascular aneurysm repair when he became formally diagnosed with lung cancer.  He has recently completed radiation treatment.  He is here today to discuss aneurysm repair.  He has a history of an ascending aortic aneurysm repair with valve replacement and CABG November 2015.  He suffers from hypercholesterolemia which was managed with a statin.  He is a former smoker  He appears to have tolerated his radiation therapy very well.  He does have occasional weakness but no significant pain or shortness of breath.  Past Medical History  Diagnosis Date  . PVD (peripheral vascular disease)   . Microhematuria     a. workup negative  . Aortic aneurysm, abdominal   . Hypercholesteremia   . Hypertension   . Thoracic aortic aneurysm without rupture   . Aortic stenosis     a. ECHO at [place]- pk AV grad 77 with a mean rate of 53   . Tobacco abuse   . CAD (coronary artery disease)     a. s/p LHC on 09/18/13 with non-obs disease and severe AS  . COPD (chronic obstructive pulmonary disease)   . Hypoglycemia   . Abdominal aortic aneurysm     Past Surgical History  Procedure Laterality Date  . Cataract extraction    . Eye surgery      CATARACT  . Throat nodule excision    . Nose surgery    . Cardiac catheterization    . Tonsillectomy    . Appendectomy    . Ascending aortic root replacement N/A 12/26/2013    Procedure: ASCENDING AORTIC  REPLACEMENT;  Surgeon: Grace Isaac, MD;  Location: [facility];  Service: Open Heart Surgery;  Laterality: N/A;  . Intraoperative transesophageal echocardiogram N/A 12/26/2013    Procedure: INTRAOPERATIVE TRANSESOPHAGEAL ECHOCARDIOGRAM;  Surgeon: Grace Isaac, MD;  Location: [facility];  Service: Open Heart Surgery;  Laterality: N/A;  . Lung biopsy  Left 12/26/2013    Procedure: LUNG BIOPSY;  Surgeon: Grace Isaac, MD;  Location: [facility];  Service: Open Heart Surgery;  Laterality: Left;  . Coronary artery bypass graft N/A 12/26/2013    Procedure: CORONARY ARTERY BYPASS GRAFTING (CABG) times one using the left internal mammary artery.;  Surgeon: Grace Isaac, MD;  Location: [facility];  Service: Open Heart Surgery;  Laterality: N/A;  . Aortic valve replacement  12/26/2013    Procedure: AORTIC VALVE REPLACEMENT (AVR);  Surgeon: Grace Isaac, MD;  Location: [facility];  Service: Open Heart Surgery;;  . Left and right heart catheterization with coronary angiogram N/A 09/18/2013    Procedure: LEFT AND RIGHT HEART CATHETERIZATION WITH CORONARY ANGIOGRAM;  Surgeon: Blane Ohara, MD;  Location: [facility] CATH LAB;  Service: Cardiovascular;  Laterality: N/A;    History   Social History  . Marital Status: Married    Spouse Name: N/A  . Number of Children: 3  . Years of Education: N/A   Occupational History  . retired from [employer]  . Smoking status: Former Smoker -- 0.50 packs/day for 60 years    Types: Cigarettes    Quit date: 12/22/2013  . Smokeless tobacco: Never Used  . Alcohol Use: 0.0 oz/week    0 Standard drinks or equivalent per  week     Comment: " occasional beer"  . Drug Use: No  . Sexual Activity: Not on file   Other Topics Concern  . Not on file   Social History Narrative    Family History  Problem Relation Age of Onset  . Renal Disease Father   . Congestive Heart Failure Mother     Allergies as of 07/23/2014  . (No Known Allergies)    Current Outpatient Prescriptions on File Prior to Visit  Medication Sig Dispense Refill  . amiodarone (PACERONE) 200 MG tablet Take 1 tablet (200 mg total) by mouth daily. 31 tablet 6  . atorvastatin (LIPITOR) 20 MG tablet Take 1 tablet (20 mg total) by mouth daily at 6 PM. 31 tablet 6  . cholecalciferol (VITAMIN D) 1000 UNITS tablet Take 1,000  Units by mouth daily.     . Garlic 979 MG CAPS Take 300 mg by mouth daily.     . hyaluronate sodium (RADIAPLEXRX) GEL Apply 1 application topically once.    . metoprolol tartrate (LOPRESSOR) 25 MG tablet Take 0.5 tablets (12.5 mg total) by mouth 2 (two) times daily. 62 tablet 6  . Multiple Vitamins-Minerals (CENTRUM SILVER ADULT 50+ PO) Take 1 tablet by mouth daily.     No current facility-administered medications on file prior to visit.     REVIEW OF SYSTEMS: Please see history of present illness  PHYSICAL EXAMINATION:   Vital signs are  Filed Vitals:   07/23/14 1410 07/23/14 1411  BP: 159/64 156/68  Pulse: 88 92  Resp: 16   Height: 5' 8.5" (1.74 m)   Weight: 155 lb (70.308 kg)    Body mass index is 23.22 kg/(m^2). General: The patient appears their stated age. HEENT:  No gross abnormalities Pulmonary:  Non labored breathing Musculoskeletal: There are no major deformities. Neurologic: No focal weakness or paresthesias are detected, Skin: There are no ulcer or rashes noted. Psychiatric: The patient has normal affect.    Diagnostic Studies none  Assessment: juxtarenal abdominal aortic aneurysm Plan: The patient is scheduled to have a PET scan to evaluate for residual versus new disease.  Once this has been completed, if it is negative, I will repeat his CT angiogram of the chest abdomen and pelvis.  I will size his graft based off these images as it has beenmore than 4 months since his most recent CT scan.  More than likely we will proceed with fenestrated aneurysm repair assuming he is cleared from a pulmonary perspective.  I again went over the risks and benefits of the operation.  He is willing to proceed.  Eldridge Abrahams, M.D. [facility] Office: [phone] Pager:  [phone]

## 2014-07-23 NOTE — Progress Notes (Signed)
Filed Vitals:   07/23/14 1410 07/23/14 1411  BP: 159/64 156/68  Pulse: 88 92  Resp: 16   Height: 5' 8.5" (1.74 m)   Weight: 155 lb (70.308 kg)    Body mass index is 23.22 kg/(m^2).

## 2014-07-24 ENCOUNTER — Encounter: Payer: Self-pay | Admitting: Radiation Oncology

## 2014-07-24 ENCOUNTER — Encounter: Payer: Self-pay | Admitting: Surgery

## 2014-07-24 NOTE — Progress Notes (Signed)
CT scan to be done at [facility] Oct 14 at 1pm. Follow up appt with Dr. Pablo Ledger on 12/06/2014 at 1pm. Patient agreeable.

## 2014-07-24 NOTE — Addendum Note (Signed)
Addended by: Dorthula Rue L on: 07/24/2014 05:29 PM   Modules accepted: Orders

## 2014-07-24 NOTE — Progress Notes (Signed)
Spoke with Myrle Sheng at [facility] at [phone] and scheduled patient for a CT chest for Monday, June 13th at 1pm. I called patient and he was agreeable. I informed Dr. Pablo Ledger.

## 2014-07-26 ENCOUNTER — Telehealth: Payer: Self-pay

## 2014-07-26 ENCOUNTER — Telehealth: Payer: Self-pay | Admitting: Surgery

## 2014-07-26 ENCOUNTER — Telehealth: Payer: Self-pay | Admitting: *Deleted

## 2014-07-26 ENCOUNTER — Encounter: Payer: Self-pay | Admitting: Radiation Oncology

## 2014-07-26 NOTE — Telephone Encounter (Signed)
-----   Message from Serafina Mitchell, MD sent at 07/26/2014  3:56 PM EDT ----- Regarding: RE: need recommendation re: pt. with AAA He just got finished with XRT for lung Ca.  I would get their approval.  He can proceed from my perspective ----- Message -----    From: Denman George, RN    Sent: 07/24/2014   1:26 PM      To: Serafina Mitchell, MD Subject: need recommendation re: pt. with AAA           You saw this pt. yesterday.  His daighter is calling to inquire if he should hold-off taking Cardiac Rehab, until the 5.7 AAA is repaired?  Please advise.

## 2014-07-26 NOTE — Telephone Encounter (Signed)
Patient called to triage asking if he can resume his cardiac rehab visits; he states that he has gotten clearance from his cardiologist but wanted to get cleared by Dr. Trula Slade also. I told him that his daughter had already called and spoke to [place] about this and that we have sent Dr. Trula Slade a staff message.

## 2014-07-26 NOTE — Telephone Encounter (Signed)
-----   Message from Denman George, RN sent at 07/24/2014  1:52 PM EDT ----- Regarding: CT scan Contact: [phone] Above phone # is for pt's daughter, Al Corpus.  She left a vm that the pt. was seen yesterday, and is to have a CT scan scheduled; she is inquiring if the CT scan can be scheduled for 6/17; can you call her to set up the scan based on her schedule? (she also asked about if pt. should hold off on doing Cardiac Rehab, until AAA is repaired, and I have sent a message to Dr. Trula Slade re: this question)

## 2014-07-26 NOTE — Progress Notes (Signed)
Alfred Hall stated that the CT scan done at [facility] on Monday, June 13th was fine (with our Dr. Unknown Jim orders). Confirmed with Joe at [facility] that patient will be seen there at 1pm on Monday, June 13th.

## 2014-07-27 ENCOUNTER — Other Ambulatory Visit: Payer: Medicare Other

## 2014-07-27 ENCOUNTER — Telehealth: Payer: Self-pay | Admitting: *Deleted

## 2014-07-27 NOTE — Telephone Encounter (Signed)
Mr. Alfred Hall called questioning if Dr. Pablo Ledger planned to schedule his CT for the week of 07/30/14 in [place] and wants clarification regarding this scan.  Reinterated that according to Dr. Unknown Jim note, she stated the June 2016 scan can be completed closer to his home prior to the appt scheduled with Dr. Servando Snare on August 23, 2014. However he states  that he "thought" she was ordering this scan in addition to her order for his the October 2016 CT scan which she scheduled to be done in [place].    He also reports that  Dr. Bennye Alm has instructed him to get clearance from Dr. Pablo Ledger for resumption of his Cardiac Rehab.

## 2014-07-31 ENCOUNTER — Telehealth: Payer: Self-pay | Admitting: *Deleted

## 2014-07-31 NOTE — Telephone Encounter (Signed)
CALLED PATIENT TO GIVE INFO., LVM FOR A RETURN CALL 

## 2014-08-03 ENCOUNTER — Ambulatory Visit
Admission: RE | Admit: 2014-08-03 | Discharge: 2014-08-03 | Disposition: A | Discharge status: Home / Self Care | Payer: Medicare Other | Source: Ambulatory Visit | Attending: Surgery | Admitting: Surgery

## 2014-08-03 DIAGNOSIS — I714 Abdominal aortic aneurysm, without rupture, unspecified: Secondary | ICD-10-CM

## 2014-08-03 MED ORDER — IOPAMIDOL (ISOVUE-370) INJECTION 76%
60.0000 mL | Freq: Once | INTRAVENOUS | Status: AC | PRN
  Administered 2014-08-03: 60 mL via INTRAVENOUS

## 2014-08-06 ENCOUNTER — Ambulatory Visit: Payer: Medicare Other | Admitting: Cardiothoracic Surgery

## 2014-08-23 ENCOUNTER — Ambulatory Visit: Payer: Medicare Other | Admitting: Cardiothoracic Surgery

## 2014-09-27 ENCOUNTER — Encounter: Payer: Self-pay | Admitting: Cardiothoracic Surgery

## 2014-09-27 ENCOUNTER — Ambulatory Visit (INDEPENDENT_AMBULATORY_CARE_PROVIDER_SITE_OTHER): Payer: Medicare Other | Admitting: Cardiothoracic Surgery

## 2014-09-27 VITALS — BP 133/58 | HR 64 | Resp 20 | Ht 68.5 in | Wt 158.0 lb

## 2014-09-27 DIAGNOSIS — I712 Thoracic aortic aneurysm, without rupture: Secondary | ICD-10-CM

## 2014-09-27 DIAGNOSIS — C3412 Malignant neoplasm of upper lobe, left bronchus or lung: Secondary | ICD-10-CM

## 2014-09-27 DIAGNOSIS — I7121 Aneurysm of the ascending aorta, without rupture: Secondary | ICD-10-CM

## 2014-09-27 MED ORDER — ASPIRIN EC 81 MG PO TBEC: 81.0000 mg | DELAYED_RELEASE_TABLET | Freq: Every day | ORAL | Status: AC

## 2014-09-27 NOTE — Progress Notes (Signed)
[address]       [place] [address]             [phone]      Breaker P [facility] Medical Record #[id number] Date of Birth: 1929-09-21  Referring: Sherren Mocha, MD Primary Care: Augustin Coupe, MD  Chief Complaint:   Lung Cancer  12/26/2013  OPERATIVE REPORT PREOPERATIVE DIAGNOSES: Ascending aortic aneurysm, severe critical aortic stenosis, coronary artery disease, left lung mass. POSTOPERATIVE DIAGNOSES: Ascending aortic aneurysm, severe critical aortic stenosis, coronary artery disease, left lung mass. SURGICAL PROCEDURES: Supracoronary replacement of ascending aorta with Hemashield graft 32 mm, replacement of aortic valve with pericardial tissue valve 25 mm, [employer], model 3300TFX, serial [id number]; coronary artery bypass grafting with the left internal mammary to the left anterior descending coronary artery, biopsy of left lung mass. SURGEON: Lanelle Bal, MD Path: Diagnosis 1. Lung, biopsy, Left upper lobe - FIBROSIS AND INFLAMMATION. - NO MALIGNANCY IDENTIFIED. 2. Aneurysm, aortic, Ascending aortic - LARGE VESSEL WALL WITH MYXOID DEGENERATION. 3. Heart valve leaflets, Aortic valve - VALVE TISSUE WITH NODULAR CALCIFICATIONS. Vicente Males MD Pathologist, Electronic Signature  Repeat Biopsy: 04/19/2014  Diagnosis Lung, needle/core biopsy(ies), Left upper lobe mass - INVASIVE SQUAMOUS CELL CARCINOMA, SEE COMMENT. Microscopic Comment The case was reviewed by Dr Lyndon Code who concurs. There is adequate tumor for additional ancillary tumor testing. (CRR:ecj 04/19/2014) Mali RUND DO Pathologist, Electronic Signature (Case signed 04/19/2014)  History of Present Illness:      Follow-up CT scan and repeat needle biopsy done last month confirmed  the left upper lobe lung mass was enlarging in size and arrange for the patient to have a repeat CT scan of the chest, which now confirms invasive squamous cell carcinoma.  Although the patient tolerated his recent cardiac surgery he was with some difficulty, the degree of emphysema and age, and known almost 6 cm abdominal aortic aneurysm do not think he would tolerate a left upper lobectomy. He was referred to radiation therapy.  He completed radiation therapy to the right upper lung mass. Subsequent CT of the chest done in early June showed a decrease in size of the mass. Follow-up scan has been scheduled by Dr. Pablo Ledger for October. The patient has had no further hemoptysis since radiation treatment. He had been on aspirin for his tissue aortic valve, this is been held because of hemoptysis in the past.    Past Medical History  Diagnosis Date  . PVD (peripheral vascular disease)   . Microhematuria     a. workup negative  . Aortic aneurysm, abdominal   . Hypercholesteremia   . Hypertension   . Thoracic aortic aneurysm without rupture   . Aortic stenosis     a. ECHO at [place]- pk AV grad 77 with a mean rate of 53   . Tobacco abuse   . CAD (coronary artery disease)     a. s/p LHC on 09/18/13 with non-obs disease and severe AS  . COPD (chronic obstructive pulmonary disease)   . Hypoglycemia   . Abdominal aortic aneurysm      History  Smoking status  . Former Smoker -- 0.50 packs/day for 60 years  . Types: Cigarettes  . Quit date: 12/22/2013  Smokeless tobacco  . Never Used    History  Alcohol Use  . 0.0 oz/week  . 0 Standard drinks or equivalent per week    Comment: " occasional beer"     No Known Allergies  Current  Outpatient Prescriptions  Medication Sig Dispense Refill  . amiodarone (PACERONE) 200 MG tablet Take 1 tablet (200 mg total) by mouth daily. 31 tablet 6  . atorvastatin (LIPITOR) 20 MG tablet Take 1 tablet (20 mg total) by mouth daily at 6 PM. 31 tablet 6  . cholecalciferol (VITAMIN D) 1000 UNITS tablet Take 1,000 Units by mouth daily.     . Garlic 664 MG CAPS Take 300 mg by mouth daily.     . hyaluronate sodium (RADIAPLEXRX) GEL  Apply 1 application topically once.    . metoprolol tartrate (LOPRESSOR) 25 MG tablet Take 0.5 tablets (12.5 mg total) by mouth 2 (two) times daily. 62 tablet 6  . Multiple Vitamins-Minerals (CENTRUM SILVER ADULT 50+ PO) Take 1 tablet by mouth daily.     No current facility-administered medications for this visit.       Physical Exam: BP 133/58 mmHg  Pulse 64  Resp 20  Ht 5' 8.5" (1.74 m)  Wt 158 lb (71.668 kg)  BMI 23.67 kg/m2  SpO2 97%  General appearance: alert and cooperative Neurologic: intact Heart: regular rate and rhythm, S1, S2 normal, no murmur, click, rub or gallop Lungs: clear to auscultation bilaterally Abdomen: soft, non-tender; bowel sounds normal;  non tender palpable AAA  no organomegaly Extremities: extremities normal, atraumatic, no cyanosis or edema and Homans sign is negative, no sign of DVT Wound: well healed sternum, and chest tube sites  Diagnostic Studies & Laboratory data:     Recent Radiology Findings:  .CLINICAL DATA: 81 year old male with thoracic and abdominal aortic aneurysms. Patient has undergone prior aortic root and valve replacement as well as CABG. Past history includes left lung cancer diagnosed in August 2015. Radiation therapy was completed in May of 2016.  EXAM: CT ANGIOGRAPHY CHEST, ABDOMEN AND PELVIS  TECHNIQUE: Multidetector CT imaging through the chest, abdomen and pelvis was performed using the standard protocol during bolus administration of intravenous contrast. Multiplanar reconstructed images and MIPs were obtained and reviewed to evaluate the vascular anatomy.  CONTRAST: 60 mL Isovue 370 administered intravenously  COMPARISON: Prior CTA chest, abdomen and pelvis 03/26/2014  FINDINGS: CTA CHEST FINDINGS  Mediastinum: Peripherally calcified low attenuation nodule in the right upper gland measures 1.6 cm. This is completely unchanged compared to the prior chest CT from February. No suspicious mediastinal  or hilar adenopathy. No soft tissue mediastinal mass. The thoracic esophagus is unremarkable.  Heart/Vascular: Surgical changes of prior aortic root and valve replacement. Aneurysmal dilatation of the tubular portion of the ascending thoracic aorta just beyond the anastomosis measuring 4.3 cm in maximal diameter. Conventional 3 vessel arch anatomy. Mild scattered atherosclerotic vascular calcifications. The transverse and descending thoracic aorta are within normal limits in caliber. Irregular heterogeneous plaque and wall adherent mural thrombus present in the distal descending thoracic aorta. There is a segment were the plaque is highly ulcerated and irregular. The bulk of the thrombus/fibro fatty plaque appears to have decreased compared to prior.  Lungs/Pleura: Decreasing size of left upper lobe pulmonary mass consistent with treatment effect. Today, the mass measures 3.2 x 2.8 cm compared to 4.5 x 3.7 cm previously. Additionally, there is some internal necrosis/cavitation. Similar background of severe centrilobular pulmonary emphysema. No new pulmonary mass or nodule. Mild left pleural thickening likely secondary and reactive to radiation therapy.  Bones/Soft Tissues: Chronic nonunion of the median sternotomy. No acute fracture or aggressive appearing lytic or blastic osseous lesion.  Review of the MIP images confirms the above findings.  CTA ABDOMEN AND PELVIS  FINDINGS  VASCULAR  Aorta: Bilobed aneurysmal dilatation of the infrarenal abdominal aorta. The more proximal segment measures up to 4.7 cm in diameter while the more distal segment measures up to 5.3 cm in diameter which remains unchanged.  Celiac: Downsloping celiac artery with evidence of a median arcuate ligament compression and poststenotic dilatation to 1.3 cm. Conventional hepatic arterial anatomy. No splenic or hepatic artery aneurysm.  SMA: Widely patent and unremarkable.  Renals: 2 right  and single left renal arteries. The right accessory renal artery arises from nearly the same level is the main right renal artery. The left renal artery is the lowest of the 3 renal arteries.  IMA: Patent and unremarkable.  Inflow: Diffusely diseased with scattered heterogeneous atherosclerotic plaque. Both internal iliac arteries remain patent. No significant focal stenosis.  Proximal Outflow: Bulky calcified plaque along the posterior wall of the left common femoral artery. The visualized profunda and superficial femoral arteries remain patent.  Veins: No focal venous abnormality.  NON-VASCULAR  Abdomen: Unremarkable CT appearance of the stomach, duodenum, spleen, adrenal glands and pancreas. Normal hepatic contour and morphology. No discrete hepatic lesion. Radiopaque cholelithiasis. No evidence of cholecystitis or intra or extrahepatic biliary ductal dilatation.  No enhancing renal lesion, hydronephrosis or nephrolithiasis. 5 cm simple left renal cyst exophytic from the upper pole. Multiple additional tiny hypo attenuating lesions bilaterally are too small to characterize but also statistically highly likely benign cysts.  There are a few scattered colonic diverticula. No evidence of active inflammation. Normal appendix in the right lower quadrant. No focal bowel wall thickening or obstruction. No free fluid or suspicious adenopathy.  Pelvis: Unremarkable bladder, prostate gland and seminal vesicles. No free fluid or suspicious adenopathy.  Bones/Soft Tissues: No acute fracture or aggressive appearing lytic or blastic osseous lesion. Lower lumbar degenerative disc disease.  Review of the MIP images confirms the above findings.  IMPRESSION: VASCULAR  1. Aneurysmal dilatation of the ascending thoracic aorta measuring up to 4.3 cm in maximal diameter just beyond the anastomosis. This is similar to incrementally increased compared to 4.2 cm previously. 2.  Bilobed infrarenal abdominal aortic aneurysm with the largest dimension again measuring 5.3 cm. No significant interval change compared to 03/26/2014. 3. Irregular and ulcerated fibro fatty plaque versus wall adherent mural thrombus scattered throughout the descending thoracic aorta again noted. Overall, there appears to have been some slight interval decrease in the volume of this irregular material compared to prior. 4. Surgical changes of prior aortic root and valve replacement without evidence of complication. 5. Extensive diffuse calcified atherosclerotic plaque throughout all visualized vascular beds. 6. 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  NON VASCULAR  1. Interval treatment effect with decreased size of the left upper lobe pulmonary mass which is now status post radiation therapy. The mass presently measures 3.2 x 2.8 cm compared to 4.5 x 3.7 cm previously. No evidence of new metastatic disease or mediastinal/hilar adenopathy. 2. Additional ancillary findings as above without significant interval change.  Signed,  Criselda Peaches, MD  [facility]  [facility] Rad[facility]   Electronically Signed  By: Jacqulynn Cadet M.D.  On: 08/03/2014 15:36  Dg Chest 2 View  04/19/2014   CLINICAL DATA:  Recent biopsy, evaluate for pneumothorax  EXAM: CHEST  2 VIEW  COMPARISON:  Portable chest x-ray of 04/18/2014 and CT chest of 03/26/2014  FINDINGS: The large left upper lobe mass is again noted consistent with primary lung carcinoma. A left pleural effusion  is present. The lungs are hyperaerated consistent with emphysema. The heart is mildly enlarged and stable. Median sternotomy sutures are noted from prior aortic valve replacement.  IMPRESSION: 1. No change in large left upper lobe mass consistent with primary lung carcinoma. 2. Small left pleural effusion. 3. Emphysema.    Electronically Signed   By: Ivar Drape M.D.   On: 04/19/2014 07:52   Ct Biopsy  04/18/2014   CLINICAL DATA:  81 year old male with a left upper lobe mass. That the mass has been previously biopsied with inflammatory cells present. Has been referred for a repeat biopsy.  EXAM: CT GUIDED CORE BIOPSY OF LEFT UPPER LOBE MASS  ANESTHESIA/SEDATION: 1.5  Mg IV Versed; 75 mcg IV Fentanyl  Total Moderate Sedation Time: 19 minutes.  PROCEDURE: The procedure risks, benefits, and alternatives were explained to the patient. Questions regarding the procedure were encouraged and answered. The patient understands and consents to the procedure.  Scout CT of the chest performed for surgical planning purposes.  The left lateral upper chest was prepped with Betadinein a sterile fashion, and a sterile drape was applied covering the operative field. A sterile gown and sterile gloves were used for the procedure. Local anesthesia was provided with 1% Lidocaine.  Once the patient is prepped and draped sterilely, the skin and subcutaneous tissues were generously infiltrated with 1% lidocaine without epinephrine.  Using CT guidance, a 17 gauge guide needle was advanced into the mass of the left upper lobe. Four separate 18 gauge core biopsy were then retrieved.  Before withdrawing the guide needle, a BioSentry device was deployed for decreasing the risk for pneumothorax development.  Once the needle was removed a final CT was performed.  The patient tolerated the procedure well and remained hemodynamically stable throughout.  No complications were encountered and no significant blood loss was encountered.  COMPLICATIONS: None  FINDINGS: CT of the chest demonstrates mass of the left upper lobe, with a background of severe emphysema.  Images during the case demonstrate safe placement of the tip of 17 gauge guide needle into the anterior aspect of left upper lobe mass.  Status post biopsy, CT scan demonstrates no evidence of pneumothorax.  Expected small amount of hemorrhage along the lateral aspect of the mass.  IMPRESSION: Status post CT-guided biopsy of left upper lobe mass. Tissue specimen sent to pathology for complete histopathologic analysis.  Signed,  Dulcy Fanny. Earleen Newport, DO  [facility]  [facility] Radiology   Electronically Signed   By: Corrie Mckusick D.O.   On: 04/18/2014 12:49   Dg Chest Port 1 View  04/18/2014   CLINICAL DATA:  81 year old male with a history of left upper lobe lung lesion, status post biopsy.  EXAM: PORTABLE CHEST - 1 VIEW  COMPARISON:  CT biopsy 04/18/2014, prior CT chest 03/26/2014, plain film 02/27/2014  FINDINGS: Cardiomediastinal silhouette within normal limits in size and contour. Atherosclerotic calcifications of the aortic arch.  Surgical changes of prior median sternotomy and aortic valve replacement.  Rounded density within the left upper lung, compatible with known lung mass.  Vague lucency along the left heart border. No evidence of large pleural fluid.  Right lung well aerated.  IMPRESSION: Status post left lung biopsy there is suspicion of small anterior pneumothorax.  Left upper lobe lung mass, previously demonstrated on the cross-sectional imaging, status post biopsy.  Surgical changes of median sternotomy and aortic valve replacement.  Atherosclerosis.  Signed,  Dulcy Fanny. Earleen Newport, DO  [facility]  [facility]iology   Electronically Signed   By: Corrie Mckusick D.O.   On: 04/18/2014 15:31   Ct Angio Chest Aorta W/cm &/or Wo/cm  03/26/2014   CLINICAL DATA:  Thoracoabdominal aortic aneurysm.  EXAM: CT ANGIOGRAPHY CHEST, ABDOMEN AND PELVIS  TECHNIQUE: Multidetector CT imaging through the chest, abdomen and pelvis was performed using the standard protocol during bolus administration of intravenous contrast. Multiplanar reconstructed images and MIPs were obtained and reviewed to evaluate the vascular anatomy.  CONTRAST:  54m OMNIPAQUE  IOHEXOL 350 MG/ML SOLN  COMPARISON:  CT scan of chest of September 29, 2013.  FINDINGS: CTA CHEST FINDINGS  No pneumothorax is noted. Minimal left pleural effusion is noted. Emphysematous disease is noted in the upper lobes bilaterally. 4.5 x 3.7 cm mass is noted posteriorly in the left upper lobe which is significantly increased in size compared to prior exam and therefore concerning for malignancy.  There is no evidence of thoracic aortic dissection. Moderate atheromatous disease and calcified plaque is noted in the descending thoracic aorta. Status post aortic valve repair. 4.2 cm ascending aortic aneurysm is noted. Transverse aortic arch measures 2.8 cm 2.7 cm in diameter. Proximal portion of descending thoracic aorta measures 3.3 cm in diameter. Distal portion of descending thoracic aorta measures 3.0 cm. Great vessels are widely pain without significant stenosis.  Review of the MIP images confirms the above findings.  CTA ABDOMEN AND PELVIS FINDINGS  Cholelithiasis is noted. No focal abnormality is noted in the liver, spleen or pancreas. Adrenal glands appear normal. Bilateral renal cysts are noted with the largest measuring 5.3 cm. No hydronephrosis or renal obstruction is noted. There is no evidence of bowel obstruction. Urinary bladder appears normal. No abnormal fluid collection is noted.  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 Superior mesenteric and bilateral renal arteries are widely patent. Two renal arteries are noted on the right. Infrarenal abdominal aortic aneurysm with mural thrombus is noted, measuring 5.7 cm in maximum AP diameter and 5.4 cm in maximum transverse diameter. It extends to the aortic bifurcation. Iliac arteries are widely patent, without significant aneurysmal dilatation but extensive calcifications. Infrarenal neck measures less than 1 cm in length with minor axis of 17 mm. No dissection is noted.  Review of the MIP images confirms  the above findings.  IMPRESSION: 4.5 x 3.7 cm left upper lobe mass is noted which is significantly increased compared to prior exam, and concerning for malignancy.  4.2 cm ascending thoracic aortic aneurysm is noted with dilatation of the descending thoracic noted as well.  Cholelithiasis.  Infrarenal abdominal aortic aneurysm is noted with mural thrombus which measures 5.7 x 5.4 cm in size.  No dissection is noted in the thoracic or abdominal aorta.  These results will be called to the ordering clinician or representative by the Radiologist Assistant, and communication documented in the PACS or zVision Dashboard.   Electronically Signed   By: JSabino DickM.D.   On: 03/26/2014 14:14   I have independently reviewed the above radiology studies  and reviewed the findings with the patient.   Recent Lab Findings: Lab Results  Component Value Date   WBC 9.8 04/18/2014   HGB 12.0* 04/18/2014   HCT 37.7* 04/18/2014   PLT 263 04/18/2014   GLUCOSE 96 01/24/2014   ALT 11 12/28/2013   AST 35 12/28/2013   NA 130* 01/24/2014   K 4.3 01/24/2014   CL 91* 01/24/2014   CREATININE 1.4  01/24/2014   BUN 20 01/24/2014   CO2 32 01/24/2014   INR 1.11 04/18/2014   HGBA1C 6.0* 12/22/2013      Assessment / Plan:  Biopsy confirmed   INVASIVE SQUAMOUS CELL CARCINOMA of the left upper lobe. Previous biopsy negative x2   Patient  saw Dr. Pablo Ledger and and completed radiation therapy with decrease in size of left upper lobe lung mass on CT scan of the chest June 2016.             Patient has follow-up with Dr. Pablo Ledger in October and scheduled for a CT scan of the chest  Patient has seen Dr. Trula Slade for consideration of stenting of his abdominal aneurysm.  He had follow-up with cardiology but missed the appointment and has not been given a new one, will contact office to arrange for his cardiology follow-up  I'll plan to see him back in 6 months As long as he has no further hemoptysis he will resume asa 81 mg  daily Grace Isaac MD      [address] [place] [address] Office [phone]   Beeper [phone]  09/27/2014 12:45 PM

## 2014-10-04 ENCOUNTER — Other Ambulatory Visit: Payer: Self-pay

## 2014-10-04 ENCOUNTER — Encounter (HOSPITAL_COMMUNITY): Payer: Self-pay | Admitting: Vascular Surgery

## 2014-10-04 NOTE — Progress Notes (Addendum)
Anesthesia Chart Review: Patient is a 81 year old male scheduled for AAA endovascular fenestrated stent graft on 10/19/14 by Dr. Trula Slade. Case is posted from 7:30 AM - 15:54 PM.  PAT visit has not yet been scheduled, but I was asked by [facility] to have anesthesiologist view chart now to see if additional clearances/recommendations are anticipated.  History includes CAD with ascending TAA and severe AS s/p Supracoronary replacement of ascending aorta with 32 mm Hemashield graft and AVR (25 mm pericardial tissue) with CABG (LIMA to LAD) 12/26/13, brief post-CABG afib, 5.3 bilobed IR AAA, PAD s/p left EIA stent '04, former smoker, invasive T2N0 SCC lung CA (04/18/14 biopsy) s/p radation, microhematuria (reported negative work-up), HTN, hypoglycemia, hypercholesterolemia, COPD, nasal surgery.   PCP is Dr. Belenda Cruise ([place]). Local cardiologist is Dr. Acie Fredrickson. CT surgeon is Dr. Servando Snare. RAD-ONC Dr. Pablo Ledger. He has been followed at the [facility] in [place], [place] in the past. He is scheduled to been seen by Ermalinda Barrios, PA-C on 10/08/14 for a pre-operative cardiology evaluation.   01/24/14 EKG: SR, PACs, rightward axis, cannot rule out anterior infarct (age undetermined), T wave abnormality, consider ischemia.  PRE-AVR/CABG Echo 11/28/13: Global hypokinesis most prominent in the inferior wall; overallEF 45; grade 1 diastolic dysfunction; heavily calcified aortic valve with reduced cusp excursion; severe AS with mean gradient of 56 mmHg; dilated aortic root (44 mm).  PRE-AVR/CABG Cardiac cath 09/18/13:  Final Conclusions:  1. Known severe aortic stenosis with a severely calcified and restricted aortic valve on plain fluoroscopy  2. Mild to moderate CAD with moderate mid LAD stenosis and nonobstructive left circumflex stenosis and a left dominant coronary system  3. Normal right heart pressures  4. Ascending aortic aneurysm   Carotid duplex 12/22/13: Findings suggest 1-39% internal carotid artery  stenosis bilaterally. Vertebral arteries are patent with antegrade flow.  09/06/13 PFT's: FVC 2.70 (75%), FEV11.49 (59%), DLCO 12.74(42%).  PAT has not been scheduled yet, but he will need labs and any additional pre-operative testing done at that time. I reviewed with anesthesiologist Dr. Smith Robert. We would like to make sure he has a follow-up echo since his AVR, as it does not appear that one has been done yet. I have notified [facility] RN Colletta Maryland and sent a staff message to Dr. Acie Fredrickson and Bonnell Public, PA-C. Will await for cardiology recommendations, otherwise no new recommendations at this time.  George Hugh [facility] Short Stay Center/Anesthesiology Phone [phone] 10/04/2014 5:27 PM  Addendum: PAT was 10/12/14. Patient was seen by Bonnell Public, PA-C on 10/08/14. Echo ordered (see below).  10/08/14 EKG: SB at 58 bpm, non-specific ST/T wave abnormality.  10/12/14 Echo: Study Conclusions: - Left ventricle: Mild diffuse hypokinesis with abnormal septal motion The cavity size was mildly dilated. Wall thickness was increased in a pattern of mild LVH. The estimated ejection fraction was 45%. - Aortic valve: 25 mm Bioprosthetic AVR with root graft intact. Moderate aortic root dilatation distal to graft. (Aortic root ID, ED 41 mm.) Trivial perivalvular regurgitationwith no AS. Consider chest CTA to further assess the distal anastamosis of the tube graft Valve area (VTI): 1.38 cm^2. Valve area (Vmax): 1.33 cm^2. Valve area (Vmean): 1.36 cm^2. - Mitral valve: Calcified annulus. Mildly thickened leaflets . - Atrial septum: No defect or patent foramen ovale was identified. - Pulmonary arteries: PA peak pressure: 34 mm Hg (S).  08/03/14 CTA Chest/Aorta: IMPRESSION: VASCULAR 1. Aneurysmal dilatation of the ascending thoracic aorta measuring up to 4.3 cm in maximal diameter just beyond the anastomosis. This is  similar to incrementally increased compared to 4.2 cm previously (03/26/14). 2. Bilobed infrarenal  abdominal aortic aneurysm with the largest dimension again measuring 5.3 cm. No significant interval change compared to 03/26/2014. 3. Irregular and ulcerated fibro fatty plaque versus wall adherent mural thrombus scattered throughout the descending thoracic aorta again noted. Overall, there appears to have been some slight interval decrease in the volume of this irregular material compared to prior. 4. Surgical changes of prior aortic root and valve replacement without evidence of complication. 5. Extensive diffuse calcified atherosclerotic plaque throughout all visualized vascular beds. 6. 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 NON VASCULAR 1. Interval treatment effect with decreased size of the left upper lobe pulmonary mass which is now status post radiation therapy. The mass presently measures 3.2 x 2.8 cm compared to 4.5 x 3.7 cm previously. No evidence of new metastatic disease or mediastinal/hilar adenopathy. 2. Additional ancillary findings as above without significant interval change (see full report under Results Review tab). (This report was included in patient's last CT surgery visit with Dr. Servando Snare on 09/27/14. Six month office follow-up is planned.)  Labs from 10/12/14 noted. Cr 1.82 (previously 0.47-1.46 since 08/2013. I requested more recent labs from Dr. Mable Fill as available, but records are still pending. I also notified Dr. Aleen Campi' office and he is planning to admit patient the day before surgery for IV hydration.   George Hugh [facility] Short Stay Center/Anesthesiology Phone [phone] 10/15/2014 11:46 AM  Addendum: Dr. Acie Fredrickson felt echo unchanged, and had no further pre-operative recommendations. Records received from [place] [facility]. Cr on 07/25/14 was 1.7, GFR calculation was 39, consistent with CKD stage III. Patient is currently admitted to 2W for IV hydration prior to EVAR tomorrow. He will be evaluated by his  assigned anesthesiologist tomorrow. Currently, there are no new labs since 10/12/14.   George Hugh [facility] Short Stay Center/Anesthesiology Phone [phone] 10/27/2014 12:23 PM

## 2014-10-08 ENCOUNTER — Encounter: Payer: Self-pay | Admitting: Physician Assistant

## 2014-10-08 ENCOUNTER — Ambulatory Visit (INDEPENDENT_AMBULATORY_CARE_PROVIDER_SITE_OTHER): Payer: Medicare Other | Admitting: Physician Assistant

## 2014-10-08 VITALS — BP 150/68 | HR 58 | Ht 68.0 in | Wt 159.0 lb

## 2014-10-08 DIAGNOSIS — I350 Nonrheumatic aortic (valve) stenosis: Secondary | ICD-10-CM | POA: Diagnosis not present

## 2014-10-08 DIAGNOSIS — Z952 Presence of prosthetic heart valve: Secondary | ICD-10-CM

## 2014-10-08 DIAGNOSIS — Z01810 Encounter for preprocedural cardiovascular examination: Secondary | ICD-10-CM

## 2014-10-08 DIAGNOSIS — I480 Paroxysmal atrial fibrillation: Secondary | ICD-10-CM

## 2014-10-08 DIAGNOSIS — Z720 Tobacco use: Secondary | ICD-10-CM

## 2014-10-08 DIAGNOSIS — Z954 Presence of other heart-valve replacement: Secondary | ICD-10-CM | POA: Diagnosis not present

## 2014-10-08 DIAGNOSIS — I10 Essential (primary) hypertension: Secondary | ICD-10-CM

## 2014-10-08 NOTE — Assessment & Plan Note (Signed)
Patient quit smoking 

## 2014-10-08 NOTE — Patient Instructions (Addendum)
Medication Instructions:    Labwork:  NONE ORDER TODAY  Testing/Procedures:  Your physician has requested that you have an echocardiogram. Echocardiography is a painless test that uses sound waves to create images of your heart. It provides your doctor with information about the size and shape of your heart and how well your heart's chambers and valves are working. This procedure takes approximately one hour. There are no restrictions for this procedure.    Follow-Up:  2 to 3 months AFTER 10/27/2014 surgery   Any Other Special Instructions Will Be Listed Below (If Applicable).

## 2014-10-08 NOTE — Assessment & Plan Note (Signed)
Blood pressure is up a little today but he was rushing to get here. It has been stable.

## 2014-10-08 NOTE — Progress Notes (Signed)
Cardiology Office Note   Date:  10/08/2014   ID:  Alfred, Hall 01/19/1930, MRN [id number]  PCP:  Augustin Coupe, MD  Cardiologist:  Dr. Acie Fredrickson  Chief Complaint: preoperative clearance    History of Present Illness: Alfred Hall is a 81 y.o. male who presents for preoperative clearance before undergoing AAA EVAR fenestrated graft by Dr. Trula Slade scheduled for 11/14/2014. His 2-D echo is being requested by anesthesiologist because one has not been done since his aortic valve replacement and CABG.   He underwent left lung biopsy, median sternotomy for hemi-shield graft of the ascending aorta, , aortic valve replacement with pericardial tissue valve, and CABG with a LIMA to the LAD on 12/26/13. He had postop atrial fibrillation converted to normal sinus rhythm. He has hypertension, hyperlipidemia, PVD, elevated creatinine. 2-D echo 11/28/13 EF 45-50% with grade 1 diastolic dysfunction. This was preop.   Patient had a repeat lung biopsy in 04/2014 that showed invasive squamous cell carcinoma of the left upper lobe. It was not felt that he would tolerate lobectomy and he was referred to radiation therapy. Subsequent CT in June showed a decrease in size of the mass. He was on aspirin for his tissue aortic valve but this was being held because of hemoptysis in the past.  Patient comes in today accompanied by his daughter who is a Marine scientist at [employer]. Patient continues to do cardiac rehab 2 days/week near [facility] where he lives. He has chronic dyspnea on exertion from his COPD and lung CA. He denies any chest tightness, dyspnea at rest, palpitations, dizziness or presyncope. He does have some soreness when he raises his left arm above his head.He lives alone and does his own house work and laundry without a problem.     Past Medical History  Diagnosis Date  . PVD (peripheral vascular disease)   . Microhematuria     a. workup negative  . Aortic aneurysm, abdominal   . Hypercholesteremia    . Hypertension   . Thoracic aortic aneurysm without rupture   . Aortic stenosis     a. ECHO at [place]- pk AV grad 77 with a mean rate of 53   . Tobacco abuse   . CAD (coronary artery disease)     a. s/p LHC on 09/18/13 with non-obs disease and severe AS  . COPD (chronic obstructive pulmonary disease)   . Hypoglycemia   . Abdominal aortic aneurysm   . Lung cancer     Past Surgical History  Procedure Laterality Date  . Cataract extraction    . Eye surgery      CATARACT  . Throat nodule excision    . Nose surgery    . Cardiac catheterization    . Tonsillectomy    . Appendectomy    . Ascending aortic root replacement N/A 12/26/2013    Procedure: ASCENDING AORTIC  REPLACEMENT;  Surgeon: Grace Isaac, MD;  Location: [place];  Service: Open Heart Surgery;  Laterality: N/A;  . Intraoperative transesophageal echocardiogram N/A 12/26/2013    Procedure: INTRAOPERATIVE TRANSESOPHAGEAL ECHOCARDIOGRAM;  Surgeon: Grace Isaac, MD;  Location: [place];  Service: Open Heart Surgery;  Laterality: N/A;  . Lung biopsy Left 12/26/2013    Procedure: LUNG BIOPSY;  Surgeon: Grace Isaac, MD;  Location: [place];  Service: Open Heart Surgery;  Laterality: Left;  . Coronary artery bypass graft N/A 12/26/2013    Procedure: CORONARY ARTERY BYPASS GRAFTING (CABG) times one using the left internal mammary artery.;  Surgeon: Grace Isaac, MD;  Location: [place];  Service: Open Heart Surgery;  Laterality: N/A;  . Aortic valve replacement  12/26/2013    Procedure: AORTIC VALVE REPLACEMENT (AVR);  Surgeon: Grace Isaac, MD;  Location: [place];  Service: Open Heart Surgery;;  . Left and right heart catheterization with coronary angiogram N/A 09/18/2013    Procedure: LEFT AND RIGHT HEART CATHETERIZATION WITH CORONARY ANGIOGRAM;  Surgeon: Blane Ohara, MD;  Location: [facility] CATH LAB;  Service: Cardiovascular;  Laterality: N/A;     Current Outpatient Prescriptions  Medication Sig Dispense Refill  .  amiodarone (PACERONE) 200 MG tablet Take 1 tablet (200 mg total) by mouth daily. 31 tablet 6  . aspirin EC 81 MG tablet Take 1 tablet (81 mg total) by mouth daily. 30 tablet   . atorvastatin (LIPITOR) 20 MG tablet Take 1 tablet (20 mg total) by mouth daily at 6 PM. 31 tablet 6  . cholecalciferol (VITAMIN D) 1000 UNITS tablet Take 1,000 Units by mouth daily.     . Garlic 101 MG CAPS Take 300 mg by mouth daily.     . metoprolol tartrate (LOPRESSOR) 25 MG tablet Take 0.5 tablets (12.5 mg total) by mouth 2 (two) times daily. 62 tablet 6  . Multiple Vitamins-Minerals (CENTRUM SILVER ADULT 50+ PO) Take 1 tablet by mouth daily.     No current facility-administered medications for this visit.    Allergies:   Review of patient's allergies indicates no known allergies.    Social History:  The patient  reports that he quit smoking about 9 months ago. His smoking use included Cigarettes. He has a 30 pack-year smoking history. He has never used smokeless tobacco. He reports that he drinks alcohol. He reports that he does not use illicit drugs.   Family History:  The patient's    family history includes Congestive Heart Failure in his mother; Renal Disease in his father.    ROS:  Please see the history of present illness.   Otherwise, review of systems are positive for cough, muscle aches.   All other systems are reviewed and negative.    PHYSICAL EXAM: VS:  BP 150/68 mmHg  Pulse 58  Ht '5\' 8"'$  (1.727 m)  Wt 159 lb (72.122 kg)  BMI 24.18 kg/m2 , BMI Body mass index is 24.18 kg/(m^2). GEN: Well nourished, well developed, in no acute distress Neck: no JVD, HJR, carotid bruits, or masses Cardiac: BPZ;0/2 systolic murmur LSB,no gallop, rubs, thrill or heave,  Respiratory:  Decreased BS throughout without rales, wheezing or rhonchi GI: soft, nontender, nondistended, + BS MS: no deformity or atrophy Extremities: without cyanosis, clubbing, edema, good distal pulses bilaterally.  Skin: warm and dry, no  rash Neuro:  Strength and sensation are intact    EKG:  EKG is ordered today. The ekg ordered today demonstrates sinus bradycardia with nonspecific ST Twave abnormality   Recent Labs: 12/27/2013: Magnesium 2.4 12/28/2013: ALT 11 01/24/2014: BUN 20; Creatinine, Ser 1.4; Potassium 4.3; Sodium 130* 04/18/2014: Hemoglobin 12.0*; Platelets 263    Lipid Panel No results found for: CHOL, TRIG, HDL, CHOLHDL, VLDL, LDLCALC, LDLDIRECT    Wt Readings from Last 3 Encounters:  10/08/14 159 lb (72.122 kg)  09/27/14 158 lb (71.668 kg)  07/23/14 155 lb (70.308 kg)      Other studies Reviewed: Additional studies/ records that were reviewed today include and review of the records demonstrates:   FINDINGS: CTA CHEST FINDINGS   Mediastinum: Peripherally calcified low attenuation nodule in  the right upper gland measures 1.6 cm. This is completely unchanged compared to the prior chest CT from February. No suspicious mediastinal or hilar adenopathy. No soft tissue mediastinal mass. The thoracic esophagus is unremarkable.   Heart/Vascular: Surgical changes of prior aortic root and valve replacement. Aneurysmal dilatation of the tubular portion of the ascending thoracic aorta just beyond the anastomosis measuring 4.3 cm in maximal diameter. Conventional 3 vessel arch anatomy. Mild scattered atherosclerotic vascular calcifications. The transverse and descending thoracic aorta are within normal limits in caliber. Irregular heterogeneous plaque and wall adherent mural thrombus present in the distal descending thoracic aorta. There is a segment were the plaque is highly ulcerated and irregular. The bulk of the thrombus/fibro fatty plaque appears to have decreased compared to prior.   Lungs/Pleura: Decreasing size of left upper lobe pulmonary mass consistent with treatment effect. Today, the mass measures 3.2 x 2.8 cm compared to 4.5 x 3.7 cm previously. Additionally, there is some internal  necrosis/cavitation. Similar background of severe centrilobular pulmonary emphysema. No new pulmonary mass or nodule. Mild left pleural thickening likely secondary and reactive to radiation therapy.   Bones/Soft Tissues: Chronic nonunion of the median sternotomy. No acute fracture or aggressive appearing lytic or blastic osseous lesion.   Review of the MIP images confirms the above findings.   CTA ABDOMEN AND PELVIS FINDINGS   VASCULAR   Aorta: Bilobed aneurysmal dilatation of the infrarenal abdominal aorta. The more proximal segment measures up to 4.7 cm in diameter while the more distal segment measures up to 5.3 cm in diameter which remains unchanged.   Celiac: Downsloping celiac artery with evidence of a median arcuate ligament compression and poststenotic dilatation to 1.3 cm. Conventional hepatic arterial anatomy. No splenic or hepatic artery aneurysm.   SMA: Widely patent and unremarkable.   Renals: 2 right and single left renal arteries. The right accessory renal artery arises from nearly the same level is the main right renal artery. The left renal artery is the lowest of the 3 renal arteries.   IMA: Patent and unremarkable.   Inflow: Diffusely diseased with scattered heterogeneous atherosclerotic plaque. Both internal iliac arteries remain patent. No significant focal stenosis.   Proximal Outflow: Bulky calcified plaque along the posterior wall of the left common femoral artery. The visualized profunda and superficial femoral arteries remain patent.   Veins: No focal venous abnormality.   NON-VASCULAR   Abdomen: Unremarkable CT appearance of the stomach, duodenum, spleen, adrenal glands and pancreas. Normal hepatic contour and morphology. No discrete hepatic lesion. Radiopaque cholelithiasis. No evidence of cholecystitis or intra or extrahepatic biliary ductal dilatation.   No enhancing renal lesion, hydronephrosis or nephrolithiasis. 5 cm simple left  renal cyst exophytic from the upper pole. Multiple additional tiny hypo attenuating lesions bilaterally are too small to characterize but also statistically highly likely benign cysts.   There are a few scattered colonic diverticula. No evidence of active inflammation. Normal appendix in the right lower quadrant. No focal bowel wall thickening or obstruction. No free fluid or suspicious adenopathy.   Pelvis: Unremarkable bladder, prostate gland and seminal vesicles. No free fluid or suspicious adenopathy.   Bones/Soft Tissues: No acute fracture or aggressive appearing lytic or blastic osseous lesion. Lower lumbar degenerative disc disease.   Review of the MIP images confirms the above findings.   IMPRESSION: VASCULAR   1. Aneurysmal dilatation of the ascending thoracic aorta measuring up to 4.3 cm in maximal diameter just beyond the anastomosis. This is similar to incrementally increased compared  to 4.2 cm previously. 2. Bilobed infrarenal abdominal aortic aneurysm with the largest dimension again measuring 5.3 cm. No significant interval change compared to 03/26/2014. 3. Irregular and ulcerated fibro fatty plaque versus wall adherent mural thrombus scattered throughout the descending thoracic aorta again noted. Overall, there appears to have been some slight interval decrease in the volume of this irregular material compared to prior. 4. Surgical changes of prior aortic root and valve replacement without evidence of complication. 5. Extensive diffuse calcified atherosclerotic plaque throughout all visualized vascular beds. 6. 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   NON VASCULAR   1. Interval treatment effect with decreased size of the left upper lobe pulmonary mass which is now status post radiation therapy. The mass presently measures 3.2 x 2.8 cm compared to 4.5 x 3.7 cm previously. No evidence of new metastatic  disease or mediastinal/hilar adenopathy. 2. Additional ancillary findings as above without significant interval change.   Signed,   Criselda Peaches, MD   [facility]   [facility] Radiology     Electronically Signed   By: Jacqulynn Cadet M.D.   On: 08/03/2014 15:36  ASSESSMENT AND PLAN: Preoperative cardiovascular examination Patient had CABG 1 aVR with pericardial tissue valve and graft to the ascending aorta 12/26/13.He is doing well and continues to do cardiac rehab twice week. His EF was 45-50% preop. He hasn't had time to follow-up with Dr.Nahser since then. He has COPD and invasive squamous cell carcinoma of the left upper lobe of the lung treated with radiation. He is limited mostly by his COPD. I discussed this patient in detail with Dr.McAlhany. We will get a 2-D echo Friday to reassess LV function, aVR. He also had atrial fibrillation postop treated with amiodarone. He has had no recurrence and is in sinus rhythm today. May consider stopping after surgery.  Tobacco abuse Patient quit smoking  S/P AVR Patient is doing well since his AVR. Check 2-D echo to assess.  Hypertension Blood pressure is up a little today but he was rushing to get here. It has been stable.  Paroxysmal atrial fibrillation Patient had atrial fibrillation after his CABG and aVR. He has maintained normal sinus rhythm on amiodarone. Follow-up with Dr.Nahser after abdominal aortic aneurysm repair consider stopping amiodarone.     Sumner Boast, PA-C  10/08/2014 2:53 PM    [facility] [place], [place], [place]  [address] Phone: [phone]; Fax: [phone]

## 2014-10-08 NOTE — Assessment & Plan Note (Signed)
Patient had CABG 1 aVR with pericardial tissue valve and graft to the ascending aorta 12/26/13.He is doing well and continues to do cardiac rehab twice week. His EF was 45-50% preop. He hasn't had time to follow-up with Dr.Nahser since then. He has COPD and invasive squamous cell carcinoma of the left upper lobe of the lung treated with radiation. He is limited mostly by his COPD. I discussed this patient in detail with Dr.McAlhany. We will get a 2-D echo Friday to reassess LV function, aVR. He also had atrial fibrillation postop treated with amiodarone. He has had no recurrence and is in sinus rhythm today. May consider stopping after surgery.

## 2014-10-08 NOTE — Assessment & Plan Note (Signed)
Patient is doing well since his AVR. Check 2-D echo to assess.

## 2014-10-08 NOTE — Assessment & Plan Note (Signed)
Patient had atrial fibrillation after his CABG and aVR. He has maintained normal sinus rhythm on amiodarone. Follow-up with Dr.Nahser after abdominal aortic aneurysm repair consider stopping amiodarone.

## 2014-10-12 ENCOUNTER — Encounter (HOSPITAL_COMMUNITY): Payer: Self-pay

## 2014-10-12 ENCOUNTER — Ambulatory Visit (HOSPITAL_COMMUNITY)
Admission: RE | Admit: 2014-10-12 | Discharge: 2014-10-12 | Disposition: A | Discharge status: Home / Self Care | Payer: Medicare Other | Source: Ambulatory Visit | Attending: Physician Assistant | Admitting: Physician Assistant

## 2014-10-12 ENCOUNTER — Encounter (HOSPITAL_COMMUNITY)
Admission: RE | Admit: 2014-10-12 | Discharge: 2014-10-12 | Disposition: A | Discharge status: Home / Self Care | Payer: Medicare Other | Source: Ambulatory Visit | Attending: Surgery | Admitting: Surgery

## 2014-10-12 DIAGNOSIS — E785 Hyperlipidemia, unspecified: Secondary | ICD-10-CM | POA: Diagnosis not present

## 2014-10-12 DIAGNOSIS — Z952 Presence of prosthetic heart valve: Secondary | ICD-10-CM | POA: Diagnosis not present

## 2014-10-12 DIAGNOSIS — Z01810 Encounter for preprocedural cardiovascular examination: Secondary | ICD-10-CM | POA: Diagnosis not present

## 2014-10-12 DIAGNOSIS — I10 Essential (primary) hypertension: Secondary | ICD-10-CM | POA: Diagnosis not present

## 2014-10-12 DIAGNOSIS — Z0183 Encounter for blood typing: Secondary | ICD-10-CM | POA: Insufficient documentation

## 2014-10-12 DIAGNOSIS — I714 Abdominal aortic aneurysm, without rupture: Secondary | ICD-10-CM | POA: Diagnosis not present

## 2014-10-12 DIAGNOSIS — I350 Nonrheumatic aortic (valve) stenosis: Secondary | ICD-10-CM | POA: Diagnosis not present

## 2014-10-12 DIAGNOSIS — I071 Rheumatic tricuspid insufficiency: Secondary | ICD-10-CM | POA: Diagnosis not present

## 2014-10-12 DIAGNOSIS — J449 Chronic obstructive pulmonary disease, unspecified: Secondary | ICD-10-CM | POA: Insufficient documentation

## 2014-10-12 DIAGNOSIS — Z01812 Encounter for preprocedural laboratory examination: Secondary | ICD-10-CM | POA: Diagnosis not present

## 2014-10-12 DIAGNOSIS — I4891 Unspecified atrial fibrillation: Secondary | ICD-10-CM | POA: Diagnosis not present

## 2014-10-12 DIAGNOSIS — Z720 Tobacco use: Secondary | ICD-10-CM | POA: Insufficient documentation

## 2014-10-12 HISTORY — DX: Reserved for inherently not codable concepts without codable children: IMO0001

## 2014-10-12 HISTORY — DX: Presence of spectacles and contact lenses: Z97.3

## 2014-10-12 HISTORY — DX: Reserved for concepts with insufficient information to code with codable children: IMO0002

## 2014-10-12 LAB — CBC
HEMATOCRIT: 33.9 % — AB (ref 39.0–52.0)
HEMOGLOBIN: 10.5 g/dL — AB (ref 13.0–17.0)
MCH: 23.6 pg — AB (ref 26.0–34.0)
MCHC: 31 g/dL (ref 30.0–36.0)
MCV: 76.4 fL — AB (ref 78.0–100.0)
Platelets: 235 10*3/uL (ref 150–400)
RBC: 4.44 MIL/uL (ref 4.22–5.81)
RDW: 19.4 % — AB (ref 11.5–15.5)
WBC: 7 10*3/uL (ref 4.0–10.5)

## 2014-10-12 LAB — COMPREHENSIVE METABOLIC PANEL
ALK PHOS: 60 U/L (ref 38–126)
ALT: 27 U/L (ref 17–63)
ANION GAP: 9 (ref 5–15)
AST: 31 U/L (ref 15–41)
Albumin: 3.6 g/dL (ref 3.5–5.0)
BILIRUBIN TOTAL: 0.8 mg/dL (ref 0.3–1.2)
BUN: 27 mg/dL — ABNORMAL HIGH (ref 6–20)
CALCIUM: 9 mg/dL (ref 8.9–10.3)
CO2: 24 mmol/L (ref 22–32)
Chloride: 104 mmol/L (ref 101–111)
Creatinine, Ser: 1.82 mg/dL — ABNORMAL HIGH (ref 0.61–1.24)
GFR, EST AFRICAN AMERICAN: 38 mL/min — AB (ref >60)
GFR, EST NON AFRICAN AMERICAN: 33 mL/min — AB (ref >60)
GLUCOSE: 104 mg/dL — AB (ref 65–99)
POTASSIUM: 4.9 mmol/L (ref 3.5–5.1)
Sodium: 137 mmol/L (ref 135–145)
TOTAL PROTEIN: 7 g/dL (ref 6.5–8.1)

## 2014-10-12 LAB — BLOOD GAS, ARTERIAL
Acid-Base Excess: 0.8 mmol/L (ref 0.0–2.0)
BICARBONATE: 24.7 meq/L — AB (ref 20.0–24.0)
Drawn by: 421801
FIO2: 0.21
O2 Saturation: 98.2 %
PH ART: 7.428 (ref 7.350–7.450)
PO2 ART: 102 mmHg — AB (ref 80.0–100.0)
Patient temperature: 98.6
TCO2: 25.9 mmol/L (ref 0–100)
pCO2 arterial: 38.1 mmHg (ref 35.0–45.0)

## 2014-10-12 LAB — URINALYSIS, ROUTINE W REFLEX MICROSCOPIC
Bilirubin Urine: NEGATIVE
Glucose, UA: NEGATIVE mg/dL
HGB URINE DIPSTICK: NEGATIVE
Ketones, ur: NEGATIVE mg/dL
Leukocytes, UA: NEGATIVE
Nitrite: NEGATIVE
PH: 5.5 (ref 5.0–8.0)
Protein, ur: NEGATIVE mg/dL
SPECIFIC GRAVITY, URINE: 1.02 (ref 1.005–1.030)
UROBILINOGEN UA: 0.2 mg/dL (ref 0.0–1.0)

## 2014-10-12 LAB — PROTIME-INR
INR: 1.14 (ref 0.00–1.49)
PROTHROMBIN TIME: 14.8 s (ref 11.6–15.2)

## 2014-10-12 LAB — SURGICAL PCR SCREEN
MRSA, PCR: NEGATIVE
STAPHYLOCOCCUS AUREUS: NEGATIVE

## 2014-10-12 LAB — APTT: aPTT: 32 seconds (ref 24–37)

## 2014-10-12 NOTE — Progress Notes (Signed)
Per Colletta Maryland at Dr. Stephens Shire office, patient can continue taking 81 mg of Aspirin.

## 2014-10-12 NOTE — Progress Notes (Signed)
PCP- Dr. Mable Fill in [place] - Dr. Cathie Olden     -waiting for cardiac clearance- pt. Scheduled to have Echo completed 10/12/14 at 1500  EKG- 10/08/14 - Epic CXR - 05/2014  Echo- pending Cardiac Cath - 09/2013 - Epic  Pt. Denies shortness of breath and chest pain at PAT appointment.

## 2014-10-12 NOTE — Progress Notes (Signed)
  Echocardiogram 2D Echocardiogram has been performed.  Alfred Hall 10/12/2014, 3:10 PM

## 2014-10-12 NOTE — Pre-Procedure Instructions (Addendum)
    Alfred Hall  10/12/2014      [facility] - [place], [place] - [name] [address] Phone: [phone] Fax: [phone]    Your procedure is scheduled on Friday, September 2nd, 2016.   Report to [facility] Admitting at 5:30 A.M.  Call this number if you have problems the morning of surgery:  [phone]   Remember:  Do not eat food or drink liquids after midnight.   Take these medicines the morning of surgery with A SIP OF WATER: Amiodarone (Pacerone), Metoprolol Tartrate (Lopressor), Aspirin 81 mg.  Stop taking: NSAIDS,  Naproxen, Aleve, Ibuprofen, BC's, Goody's, Fish Oil, all herbal medications, and all vitamins.    Do not wear jewelry.  Do not wear lotions, powders, or colognes.  You may NOT wear deodorant.  Men may shave face and neck.  Do not bring valuables to the hospital.  [facility] is not responsible for any belongings or valuables.  Contacts, dentures or bridgework may not be worn into surgery.  Leave your suitcase in the car.  After surgery it may be brought to your room.  For patients admitted to the hospital, discharge time will be determined by your treatment team.  Patients discharged the day of surgery will not be allowed to drive home.   Special instructions:  See attached.   Please read over the following fact sheets that you were given. Pain Booklet, Coughing and Deep Breathing, Blood Transfusion Information, MRSA Information and Surgical Site Infection Prevention

## 2014-10-13 ENCOUNTER — Other Ambulatory Visit: Payer: Self-pay | Admitting: Cardiovascular Disease

## 2014-10-16 ENCOUNTER — Telehealth: Payer: Self-pay | Admitting: Adult Health

## 2014-10-16 NOTE — Telephone Encounter (Signed)
They need note stating patient has been cleared for surgery

## 2014-10-17 NOTE — Telephone Encounter (Signed)
Thanks, I had already read that and forwarded echo result to surgeon.Pt needs written cardiac assessment risk for surgery

## 2014-10-17 NOTE — Telephone Encounter (Signed)
I sent a copy to Dr. Trula Slade, his surgeon and anesthesia is already in epic. He doesn't need a written note. It's all in epic

## 2014-10-17 NOTE — Telephone Encounter (Signed)
Pt notified of Ms Lenze's message

## 2014-10-17 NOTE — Telephone Encounter (Signed)
Dr. Acie Fredrickson reviewed the echo and this is what he wrote :"The echo is basically unchanged from previous echo . There is nothing on this that would make me alter your pre-op assessment from the office visit."   Patient at increased risk of surgery because of age, COPD, lung CA. Cardiac status has been stable. Please see office note for details.

## 2014-10-18 ENCOUNTER — Encounter (HOSPITAL_COMMUNITY): Payer: Self-pay | Admitting: General Practice

## 2014-10-18 ENCOUNTER — Inpatient Hospital Stay (HOSPITAL_COMMUNITY)
Admission: RE | Admit: 2014-10-18 | Discharge: 2014-11-17 | DRG: 268 | Disposition: E | Payer: Medicare Other | Source: Ambulatory Visit | Attending: Surgery | Admitting: Surgery

## 2014-10-18 ENCOUNTER — Inpatient Hospital Stay (HOSPITAL_COMMUNITY): Payer: Medicare Other

## 2014-10-18 DIAGNOSIS — J449 Chronic obstructive pulmonary disease, unspecified: Secondary | ICD-10-CM | POA: Diagnosis present

## 2014-10-18 DIAGNOSIS — N179 Acute kidney failure, unspecified: Secondary | ICD-10-CM | POA: Diagnosis not present

## 2014-10-18 DIAGNOSIS — M79A21 Nontraumatic compartment syndrome of right lower extremity: Secondary | ICD-10-CM | POA: Diagnosis not present

## 2014-10-18 DIAGNOSIS — R001 Bradycardia, unspecified: Secondary | ICD-10-CM | POA: Diagnosis not present

## 2014-10-18 DIAGNOSIS — Z951 Presence of aortocoronary bypass graft: Secondary | ICD-10-CM | POA: Diagnosis not present

## 2014-10-18 DIAGNOSIS — N183 Chronic kidney disease, stage 3 (moderate): Secondary | ICD-10-CM | POA: Diagnosis present

## 2014-10-18 DIAGNOSIS — E874 Mixed disorder of acid-base balance: Secondary | ICD-10-CM | POA: Diagnosis not present

## 2014-10-18 DIAGNOSIS — D649 Anemia, unspecified: Secondary | ICD-10-CM | POA: Diagnosis not present

## 2014-10-18 DIAGNOSIS — K7200 Acute and subacute hepatic failure without coma: Secondary | ICD-10-CM | POA: Diagnosis not present

## 2014-10-18 DIAGNOSIS — R739 Hyperglycemia, unspecified: Secondary | ICD-10-CM | POA: Diagnosis not present

## 2014-10-18 DIAGNOSIS — Z95828 Presence of other vascular implants and grafts: Secondary | ICD-10-CM | POA: Diagnosis not present

## 2014-10-18 DIAGNOSIS — M79A22 Nontraumatic compartment syndrome of left lower extremity: Secondary | ICD-10-CM | POA: Diagnosis not present

## 2014-10-18 DIAGNOSIS — I714 Abdominal aortic aneurysm, without rupture, unspecified: Secondary | ICD-10-CM

## 2014-10-18 DIAGNOSIS — I998 Other disorder of circulatory system: Secondary | ICD-10-CM | POA: Diagnosis present

## 2014-10-18 DIAGNOSIS — E875 Hyperkalemia: Secondary | ICD-10-CM | POA: Diagnosis not present

## 2014-10-18 DIAGNOSIS — I469 Cardiac arrest, cause unspecified: Secondary | ICD-10-CM | POA: Diagnosis not present

## 2014-10-18 DIAGNOSIS — I700 Atherosclerosis of aorta: Secondary | ICD-10-CM | POA: Diagnosis present

## 2014-10-18 DIAGNOSIS — R34 Anuria and oliguria: Secondary | ICD-10-CM | POA: Diagnosis not present

## 2014-10-18 DIAGNOSIS — I129 Hypertensive chronic kidney disease with stage 1 through stage 4 chronic kidney disease, or unspecified chronic kidney disease: Secondary | ICD-10-CM | POA: Diagnosis present

## 2014-10-18 DIAGNOSIS — I70203 Unspecified atherosclerosis of native arteries of extremities, bilateral legs: Secondary | ICD-10-CM | POA: Diagnosis present

## 2014-10-18 DIAGNOSIS — I350 Nonrheumatic aortic (valve) stenosis: Secondary | ICD-10-CM | POA: Diagnosis present

## 2014-10-18 DIAGNOSIS — M6282 Rhabdomyolysis: Secondary | ICD-10-CM | POA: Diagnosis not present

## 2014-10-18 DIAGNOSIS — Z8679 Personal history of other diseases of the circulatory system: Secondary | ICD-10-CM

## 2014-10-18 DIAGNOSIS — E780 Pure hypercholesterolemia: Secondary | ICD-10-CM | POA: Diagnosis present

## 2014-10-18 DIAGNOSIS — N185 Chronic kidney disease, stage 5: Secondary | ICD-10-CM | POA: Diagnosis not present

## 2014-10-18 DIAGNOSIS — R579 Shock, unspecified: Secondary | ICD-10-CM | POA: Diagnosis not present

## 2014-10-18 DIAGNOSIS — C3412 Malignant neoplasm of upper lobe, left bronchus or lung: Secondary | ICD-10-CM | POA: Diagnosis present

## 2014-10-18 DIAGNOSIS — Z9889 Other specified postprocedural states: Secondary | ICD-10-CM

## 2014-10-18 DIAGNOSIS — D65 Disseminated intravascular coagulation [defibrination syndrome]: Secondary | ICD-10-CM | POA: Diagnosis not present

## 2014-10-18 DIAGNOSIS — Z953 Presence of xenogenic heart valve: Secondary | ICD-10-CM | POA: Diagnosis not present

## 2014-10-18 DIAGNOSIS — N170 Acute kidney failure with tubular necrosis: Secondary | ICD-10-CM | POA: Diagnosis not present

## 2014-10-18 DIAGNOSIS — I722 Aneurysm of renal artery: Secondary | ICD-10-CM | POA: Diagnosis not present

## 2014-10-18 DIAGNOSIS — Z87891 Personal history of nicotine dependence: Secondary | ICD-10-CM

## 2014-10-18 DIAGNOSIS — Z992 Dependence on renal dialysis: Secondary | ICD-10-CM

## 2014-10-18 DIAGNOSIS — I6523 Occlusion and stenosis of bilateral carotid arteries: Secondary | ICD-10-CM | POA: Diagnosis present

## 2014-10-18 DIAGNOSIS — Z923 Personal history of irradiation: Secondary | ICD-10-CM | POA: Diagnosis not present

## 2014-10-18 DIAGNOSIS — Z01818 Encounter for other preprocedural examination: Secondary | ICD-10-CM

## 2014-10-18 DIAGNOSIS — Z419 Encounter for procedure for purposes other than remedying health state, unspecified: Secondary | ICD-10-CM

## 2014-10-18 DIAGNOSIS — Z452 Encounter for adjustment and management of vascular access device: Secondary | ICD-10-CM

## 2014-10-18 DIAGNOSIS — J9601 Acute respiratory failure with hypoxia: Secondary | ICD-10-CM | POA: Diagnosis not present

## 2014-10-18 DIAGNOSIS — Z9289 Personal history of other medical treatment: Secondary | ICD-10-CM

## 2014-10-18 DIAGNOSIS — I2510 Atherosclerotic heart disease of native coronary artery without angina pectoris: Secondary | ICD-10-CM | POA: Diagnosis present

## 2014-10-18 HISTORY — DX: Malignant neoplasm of unspecified part of unspecified bronchus or lung: C34.90

## 2014-10-18 HISTORY — DX: Gout, unspecified: M10.9

## 2014-10-18 HISTORY — DX: Personal history of other medical treatment: Z92.89

## 2014-10-18 LAB — CBC
HCT: 35.1 % — ABNORMAL LOW (ref 39.0–52.0)
HEMOGLOBIN: 10.8 g/dL — AB (ref 13.0–17.0)
MCH: 23.6 pg — ABNORMAL LOW (ref 26.0–34.0)
MCHC: 30.8 g/dL (ref 30.0–36.0)
MCV: 76.6 fL — ABNORMAL LOW (ref 78.0–100.0)
Platelets: 238 10*3/uL (ref 150–400)
RBC: 4.58 MIL/uL (ref 4.22–5.81)
RDW: 19.6 % — ABNORMAL HIGH (ref 11.5–15.5)
WBC: 6.4 10*3/uL (ref 4.0–10.5)

## 2014-10-18 LAB — URINALYSIS, ROUTINE W REFLEX MICROSCOPIC
Bilirubin Urine: NEGATIVE
GLUCOSE, UA: NEGATIVE mg/dL
Hgb urine dipstick: NEGATIVE
Ketones, ur: NEGATIVE mg/dL
LEUKOCYTES UA: NEGATIVE
Nitrite: NEGATIVE
PROTEIN: NEGATIVE mg/dL
Specific Gravity, Urine: 1.009 (ref 1.005–1.030)
UROBILINOGEN UA: 0.2 mg/dL (ref 0.0–1.0)
pH: 7 (ref 5.0–8.0)

## 2014-10-18 LAB — COMPREHENSIVE METABOLIC PANEL
ALBUMIN: 3.4 g/dL — AB (ref 3.5–5.0)
ALK PHOS: 54 U/L (ref 38–126)
ALT: 25 U/L (ref 17–63)
ANION GAP: 7 (ref 5–15)
AST: 29 U/L (ref 15–41)
BUN: 30 mg/dL — ABNORMAL HIGH (ref 6–20)
CALCIUM: 8.8 mg/dL — AB (ref 8.9–10.3)
CO2: 27 mmol/L (ref 22–32)
Chloride: 102 mmol/L (ref 101–111)
Creatinine, Ser: 1.85 mg/dL — ABNORMAL HIGH (ref 0.61–1.24)
GFR calc Af Amer: 37 mL/min — ABNORMAL LOW (ref >60)
GFR calc non Af Amer: 32 mL/min — ABNORMAL LOW (ref >60)
GLUCOSE: 141 mg/dL — AB (ref 65–99)
Potassium: 4.4 mmol/L (ref 3.5–5.1)
SODIUM: 136 mmol/L (ref 135–145)
Total Bilirubin: 0.6 mg/dL (ref 0.3–1.2)
Total Protein: 6.4 g/dL — ABNORMAL LOW (ref 6.5–8.1)

## 2014-10-18 LAB — PROTIME-INR
INR: 1.19 (ref 0.00–1.49)
Prothrombin Time: 15.3 seconds — ABNORMAL HIGH (ref 11.6–15.2)

## 2014-10-18 MED ORDER — CHLORHEXIDINE GLUCONATE CLOTH 2 % EX PADS
6.0000 | MEDICATED_PAD | Freq: Once | CUTANEOUS | Status: AC
  Administered 2014-10-19: 6 via TOPICAL

## 2014-10-18 MED ORDER — CHLORHEXIDINE GLUCONATE CLOTH 2 % EX PADS
6.0000 | MEDICATED_PAD | Freq: Once | CUTANEOUS | Status: AC
  Administered 2014-10-18: 6 via TOPICAL

## 2014-10-18 MED ORDER — SODIUM CHLORIDE 0.9 % IV SOLN: INTRAVENOUS | Status: DC

## 2014-10-18 MED ORDER — ONDANSETRON HCL 4 MG/2ML IJ SOLN: 4.0000 mg | Freq: Four times a day (QID) | INTRAMUSCULAR | Status: DC | PRN

## 2014-10-18 MED ORDER — ENOXAPARIN SODIUM 30 MG/0.3ML ~~LOC~~ SOLN
30.0000 mg | SUBCUTANEOUS | Status: DC
  Administered 2014-10-18: 30 mg via SUBCUTANEOUS
  Filled 2014-10-18 (×2): qty 0.3

## 2014-10-18 MED ORDER — DEXTROSE 5 % IV SOLN
1.5000 g | INTRAVENOUS | Status: AC
  Administered 2014-10-20: 1.5 g via INTRAVENOUS
  Filled 2014-10-18: qty 1.5

## 2014-10-18 MED ORDER — PANTOPRAZOLE SODIUM 40 MG PO TBEC
40.0000 mg | DELAYED_RELEASE_TABLET | Freq: Every day | ORAL | Status: DC
  Administered 2014-10-18: 40 mg via ORAL
  Filled 2014-10-18: qty 1

## 2014-10-18 MED ORDER — VITAMIN D3 25 MCG (1000 UNIT) PO TABS
1000.0000 [IU] | ORAL_TABLET | Freq: Every day | ORAL | Status: DC
  Filled 2014-10-18: qty 1

## 2014-10-18 MED ORDER — SODIUM CHLORIDE 0.9 % IV SOLN
INTRAVENOUS | Status: DC
Start: 2014-10-18 — End: 2014-10-21
  Administered 2014-10-18: 75 mL/h via INTRAVENOUS
  Administered 2014-10-19 – 2014-10-20 (×4): via INTRAVENOUS

## 2014-10-18 MED ORDER — MORPHINE SULFATE (PF) 2 MG/ML IV SOLN: 2.0000 mg | INTRAVENOUS | Status: DC | PRN

## 2014-10-18 MED ORDER — ASPIRIN EC 81 MG PO TBEC
81.0000 mg | DELAYED_RELEASE_TABLET | Freq: Every day | ORAL | Status: DC
  Filled 2014-10-18: qty 1

## 2014-10-18 MED ORDER — ATORVASTATIN CALCIUM 20 MG PO TABS
20.0000 mg | ORAL_TABLET | Freq: Every day | ORAL | Status: DC
  Administered 2014-10-18: 20 mg via ORAL
  Filled 2014-10-18 (×3): qty 1

## 2014-10-18 MED ORDER — INFLUENZA VAC SPLIT QUAD 0.5 ML IM SUSY
0.5000 mL | PREFILLED_SYRINGE | INTRAMUSCULAR | Status: DC
  Filled 2014-10-18: qty 0.5

## 2014-10-18 MED ORDER — METOPROLOL TARTRATE 12.5 MG HALF TABLET
12.5000 mg | ORAL_TABLET | Freq: Two times a day (BID) | ORAL | Status: DC
  Administered 2014-10-18: 12.5 mg via ORAL
  Filled 2014-10-18 (×3): qty 1

## 2014-10-18 MED ORDER — DEXTROSE 5 % IV SOLN: 1.5000 g | INTRAVENOUS | Status: DC

## 2014-10-18 MED ORDER — GUAIFENESIN-DM 100-10 MG/5ML PO SYRP
15.0000 mL | ORAL_SOLUTION | ORAL | Status: DC | PRN
  Administered 2014-10-19: 15 mL via ORAL
  Filled 2014-10-18: qty 15

## 2014-10-18 MED ORDER — ALUM & MAG HYDROXIDE-SIMETH 200-200-20 MG/5ML PO SUSP: 15.0000 mL | ORAL | Status: DC | PRN

## 2014-10-18 MED ORDER — HYDRALAZINE HCL 20 MG/ML IJ SOLN: 5.0000 mg | INTRAMUSCULAR | Status: DC | PRN

## 2014-10-18 MED ORDER — ACETAMINOPHEN 325 MG PO TABS
325.0000 mg | ORAL_TABLET | ORAL | Status: DC | PRN
Start: 2014-10-18 — End: 2014-10-20

## 2014-10-18 MED ORDER — AMIODARONE HCL 200 MG PO TABS
200.0000 mg | ORAL_TABLET | Freq: Every day | ORAL | Status: DC
  Filled 2014-10-18: qty 1

## 2014-10-18 MED ORDER — ACETAMINOPHEN 325 MG RE SUPP
325.0000 mg | RECTAL | Status: DC | PRN
  Filled 2014-10-18: qty 2

## 2014-10-18 MED ORDER — OXYCODONE HCL 5 MG PO TABS
5.0000 mg | ORAL_TABLET | ORAL | Status: DC | PRN
  Administered 2014-10-19: 10 mg via ORAL
  Filled 2014-10-18: qty 2

## 2014-10-18 MED ORDER — PHENOL 1.4 % MT LIQD
1.0000 | OROMUCOSAL | Status: DC | PRN
Start: 2014-10-18 — End: 2014-10-21

## 2014-10-18 MED ORDER — POTASSIUM CHLORIDE CRYS ER 20 MEQ PO TBCR: 20.0000 meq | EXTENDED_RELEASE_TABLET | Freq: Once | ORAL | Status: DC | PRN

## 2014-10-18 MED ORDER — METOPROLOL TARTRATE 1 MG/ML IV SOLN: 2.0000 mg | INTRAVENOUS | Status: DC | PRN

## 2014-10-18 MED ORDER — LABETALOL HCL 5 MG/ML IV SOLN: 10.0000 mg | INTRAVENOUS | Status: DC | PRN

## 2014-10-19 ENCOUNTER — Encounter (HOSPITAL_COMMUNITY): Admission: RE | Disposition: E | Payer: Self-pay | Source: Ambulatory Visit | Attending: Surgery

## 2014-10-19 ENCOUNTER — Inpatient Hospital Stay (HOSPITAL_COMMUNITY): Payer: Medicare Other

## 2014-10-19 ENCOUNTER — Inpatient Hospital Stay (HOSPITAL_COMMUNITY): Payer: Medicare Other | Admitting: Vascular Surgery

## 2014-10-19 ENCOUNTER — Encounter (HOSPITAL_COMMUNITY): Payer: Self-pay | Admitting: Anesthesiology

## 2014-10-19 DIAGNOSIS — J9601 Acute respiratory failure with hypoxia: Secondary | ICD-10-CM

## 2014-10-19 DIAGNOSIS — R579 Shock, unspecified: Secondary | ICD-10-CM

## 2014-10-19 DIAGNOSIS — I722 Aneurysm of renal artery: Secondary | ICD-10-CM

## 2014-10-19 DIAGNOSIS — I714 Abdominal aortic aneurysm, without rupture: Principal | ICD-10-CM

## 2014-10-19 HISTORY — PX: ABDOMINAL AORTIC ENDOVASCULAR FENESTRATED STENT GRAFT: SHX6430

## 2014-10-19 HISTORY — PX: FASCIOTOMY: SHX132

## 2014-10-19 LAB — BASIC METABOLIC PANEL
ANION GAP: 20 — AB (ref 5–15)
Anion gap: 9 (ref 5–15)
Anion gap: 19 — ABNORMAL HIGH (ref 5–15)
BUN: 23 mg/dL — AB (ref 6–20)
BUN: 20 mg/dL (ref 6–20)
BUN: 22 mg/dL — AB (ref 6–20)
CALCIUM: 9 mg/dL (ref 8.9–10.3)
CALCIUM: 7.2 mg/dL — AB (ref 8.9–10.3)
CHLORIDE: 99 mmol/L — AB (ref 101–111)
CO2: 26 mmol/L (ref 22–32)
CO2: 22 mmol/L (ref 22–32)
CO2: 24 mmol/L (ref 22–32)
CREATININE: 1.74 mg/dL — AB (ref 0.61–1.24)
Calcium: 8.1 mg/dL — ABNORMAL LOW (ref 8.9–10.3)
Chloride: 101 mmol/L (ref 101–111)
Chloride: 99 mmol/L — ABNORMAL LOW (ref 101–111)
Creatinine, Ser: 2.07 mg/dL — ABNORMAL HIGH (ref 0.61–1.24)
Creatinine, Ser: 2.42 mg/dL — ABNORMAL HIGH (ref 0.61–1.24)
GFR calc Af Amer: 40 mL/min — ABNORMAL LOW (ref >60)
GFR calc Af Amer: 32 mL/min — ABNORMAL LOW (ref >60)
GFR calc Af Amer: 27 mL/min — ABNORMAL LOW (ref >60)
GFR calc non Af Amer: 23 mL/min — ABNORMAL LOW (ref >60)
GFR, EST NON AFRICAN AMERICAN: 35 mL/min — AB (ref >60)
GFR, EST NON AFRICAN AMERICAN: 28 mL/min — AB (ref >60)
GLUCOSE: 99 mg/dL (ref 65–99)
GLUCOSE: 147 mg/dL — AB (ref 65–99)
Glucose, Bld: 178 mg/dL — ABNORMAL HIGH (ref 65–99)
Potassium: 4.7 mmol/L (ref 3.5–5.1)
Potassium: 7 mmol/L (ref 3.5–5.1)
Potassium: 6.4 mmol/L (ref 3.5–5.1)
SODIUM: 136 mmol/L (ref 135–145)
SODIUM: 142 mmol/L (ref 135–145)
Sodium: 141 mmol/L (ref 135–145)

## 2014-10-19 LAB — POCT I-STAT 7, (LYTES, BLD GAS, ICA,H+H)
ACID-BASE DEFICIT: 10 mmol/L — AB (ref 0.0–2.0)
Acid-base deficit: 13 mmol/L — ABNORMAL HIGH (ref 0.0–2.0)
BICARBONATE: 18.3 meq/L — AB (ref 20.0–24.0)
Bicarbonate: 16.7 mEq/L — ABNORMAL LOW (ref 20.0–24.0)
Calcium, Ion: 1.13 mmol/L (ref 1.13–1.30)
Calcium, Ion: 0.96 mmol/L — ABNORMAL LOW (ref 1.13–1.30)
HCT: 23 % — ABNORMAL LOW (ref 39.0–52.0)
HEMATOCRIT: 31 % — AB (ref 39.0–52.0)
Hemoglobin: 7.8 g/dL — ABNORMAL LOW (ref 13.0–17.0)
Hemoglobin: 10.5 g/dL — ABNORMAL LOW (ref 13.0–17.0)
O2 SAT: 98 %
O2 Saturation: 97 %
PCO2 ART: 58.6 mmHg — AB (ref 35.0–45.0)
PH ART: 7.06 — AB (ref 7.350–7.450)
PH ART: 7.184 — AB (ref 7.350–7.450)
PO2 ART: 141 mmHg — AB (ref 80.0–100.0)
PO2 ART: 118 mmHg — AB (ref 80.0–100.0)
POTASSIUM: 6.1 mmol/L — AB (ref 3.5–5.1)
Patient temperature: 36.6
Potassium: 6.5 mmol/L (ref 3.5–5.1)
SODIUM: 138 mmol/L (ref 135–145)
Sodium: 135 mmol/L (ref 135–145)
TCO2: 18 mmol/L (ref 0–100)
TCO2: 20 mmol/L (ref 0–100)
pCO2 arterial: 48.6 mmHg — ABNORMAL HIGH (ref 35.0–45.0)

## 2014-10-19 LAB — CBC
HCT: 35.9 % — ABNORMAL LOW (ref 39.0–52.0)
HCT: 34.5 % — ABNORMAL LOW (ref 39.0–52.0)
HEMATOCRIT: 38.3 % — AB (ref 39.0–52.0)
HEMOGLOBIN: 12.2 g/dL — AB (ref 13.0–17.0)
Hemoglobin: 11.2 g/dL — ABNORMAL LOW (ref 13.0–17.0)
Hemoglobin: 11.2 g/dL — ABNORMAL LOW (ref 13.0–17.0)
MCH: 24 pg — AB (ref 26.0–34.0)
MCH: 26 pg (ref 26.0–34.0)
MCH: 25.4 pg — AB (ref 26.0–34.0)
MCHC: 31.2 g/dL (ref 30.0–36.0)
MCHC: 32.5 g/dL (ref 30.0–36.0)
MCHC: 31.9 g/dL (ref 30.0–36.0)
MCV: 76.9 fL — AB (ref 78.0–100.0)
MCV: 80 fL (ref 78.0–100.0)
MCV: 79.6 fL (ref 78.0–100.0)
PLATELETS: 255 10*3/uL (ref 150–400)
PLATELETS: 108 10*3/uL — AB (ref 150–400)
Platelets: 122 10*3/uL — ABNORMAL LOW (ref 150–400)
RBC: 4.67 MIL/uL (ref 4.22–5.81)
RBC: 4.31 MIL/uL (ref 4.22–5.81)
RBC: 4.81 MIL/uL (ref 4.22–5.81)
RDW: 19.6 % — AB (ref 11.5–15.5)
RDW: 19.3 % — AB (ref 11.5–15.5)
RDW: 19.2 % — ABNORMAL HIGH (ref 11.5–15.5)
WBC: 7.7 10*3/uL (ref 4.0–10.5)
WBC: 16.8 10*3/uL — AB (ref 4.0–10.5)
WBC: 23.1 10*3/uL — ABNORMAL HIGH (ref 4.0–10.5)

## 2014-10-19 LAB — PROTIME-INR
INR: 2.3 — ABNORMAL HIGH (ref 0.00–1.49)
Prothrombin Time: 25.1 seconds — ABNORMAL HIGH (ref 11.6–15.2)

## 2014-10-19 LAB — BLOOD GAS, ARTERIAL
Acid-base deficit: 6.3 mmol/L — ABNORMAL HIGH (ref 0.0–2.0)
BICARBONATE: 18.9 meq/L — AB (ref 20.0–24.0)
Drawn by: 347671
FIO2: 100
LHR: 16 {breaths}/min
O2 Saturation: 99.3 %
PATIENT TEMPERATURE: 98.6
PEEP: 5 cmH2O
TCO2: 20.1 mmol/L (ref 0–100)
VT: 550 mL
pCO2 arterial: 38.9 mmHg (ref 35.0–45.0)
pH, Arterial: 7.307 — ABNORMAL LOW (ref 7.350–7.450)
pO2, Arterial: 273 mmHg — ABNORMAL HIGH (ref 80.0–100.0)

## 2014-10-19 LAB — POCT I-STAT 3, ART BLOOD GAS (G3+)
Acid-base deficit: 2 mmol/L (ref 0.0–2.0)
BICARBONATE: 24.2 meq/L — AB (ref 20.0–24.0)
O2 Saturation: 98 %
PCO2 ART: 46.5 mmHg — AB (ref 35.0–45.0)
PH ART: 7.322 — AB (ref 7.350–7.450)
TCO2: 26 mmol/L (ref 0–100)
pO2, Arterial: 111 mmHg — ABNORMAL HIGH (ref 80.0–100.0)

## 2014-10-19 LAB — APTT: APTT: 86 s — AB (ref 24–37)

## 2014-10-19 LAB — PREPARE RBC (CROSSMATCH)

## 2014-10-19 LAB — MAGNESIUM: MAGNESIUM: 2.3 mg/dL (ref 1.7–2.4)

## 2014-10-19 SURGERY — FASCIOTOMY, UPPER EXTREMITY
Anesthesia: Monitor Anesthesia Care | Site: Leg Lower | Laterality: Left

## 2014-10-19 SURGERY — ABDOMINAL AORTIC ENDOVASCULAR FENESTRATED STENT GRAFT
Anesthesia: General | Site: Abdomen

## 2014-10-19 MED ORDER — FENTANYL CITRATE (PF) 250 MCG/5ML IJ SOLN
INTRAMUSCULAR | Status: AC
  Filled 2014-10-19: qty 5

## 2014-10-19 MED ORDER — SODIUM CHLORIDE 0.9 % IV SOLN
500.0000 mL | Freq: Once | INTRAVENOUS | Status: AC | PRN
  Administered 2014-10-19: 500 mL via INTRAVENOUS

## 2014-10-19 MED ORDER — DEXTROSE 50 % IV SOLN
50.0000 mL | Freq: Once | INTRAVENOUS | Status: AC
  Administered 2014-10-19: 50 mL via INTRAVENOUS
  Filled 2014-10-19: qty 50

## 2014-10-19 MED ORDER — DEXTROSE 50 % IV SOLN
INTRAVENOUS | Status: AC
  Filled 2014-10-19: qty 50

## 2014-10-19 MED ORDER — NOREPINEPHRINE BITARTRATE 1 MG/ML IV SOLN
2.0000 ug/min | INTRAVENOUS | Status: DC
  Filled 2014-10-19: qty 4

## 2014-10-19 MED ORDER — IODIXANOL 320 MG/ML IV SOLN
INTRAVENOUS | Status: DC | PRN
  Administered 2014-10-19: 150 mL via INTRAVENOUS

## 2014-10-19 MED ORDER — MIDAZOLAM HCL 2 MG/2ML IJ SOLN
2.0000 mg | Freq: Once | INTRAMUSCULAR | Status: AC
  Administered 2014-10-19: 2 mg via INTRAVENOUS

## 2014-10-19 MED ORDER — LACTATED RINGERS IV SOLN
INTRAVENOUS | Status: DC | PRN
  Administered 2014-10-19 (×2): via INTRAVENOUS

## 2014-10-19 MED ORDER — ROCURONIUM BROMIDE 50 MG/5ML IV SOLN
INTRAVENOUS | Status: AC
  Filled 2014-10-19: qty 1

## 2014-10-19 MED ORDER — BISACODYL 10 MG RE SUPP: 10.0000 mg | Freq: Every day | RECTAL | Status: DC | PRN

## 2014-10-19 MED ORDER — SODIUM POLYSTYRENE SULFONATE 15 GM/60ML PO SUSP
30.0000 g | Freq: Once | ORAL | Status: AC
  Administered 2014-10-19: 30 g
  Filled 2014-10-19: qty 120

## 2014-10-19 MED ORDER — INSULIN ASPART 100 UNIT/ML IV SOLN
10.0000 [IU] | Freq: Once | INTRAVENOUS | Status: AC
  Administered 2014-10-19: 10 [IU] via INTRAVENOUS

## 2014-10-19 MED ORDER — PHENYLEPHRINE HCL 10 MG/ML IJ SOLN
10.0000 mg | INTRAMUSCULAR | Status: DC | PRN
  Administered 2014-10-19: 25 ug/min via INTRAVENOUS

## 2014-10-19 MED ORDER — CALCIUM CHLORIDE 10 % IV SOLN
INTRAVENOUS | Status: AC
Start: 2014-10-19 — End: 2014-10-20
  Filled 2014-10-19: qty 10

## 2014-10-19 MED ORDER — AMIODARONE PEDIATRIC ORAL SUSPENSION 5 MG/ML
200.0000 mg | Freq: Every day | ORAL | Status: DC
  Filled 2014-10-19 (×2): qty 40

## 2014-10-19 MED ORDER — SODIUM CHLORIDE 0.9 % IR SOLN
Status: DC | PRN
  Administered 2014-10-19 (×3): 500 mL

## 2014-10-19 MED ORDER — PANTOPRAZOLE SODIUM 40 MG IV SOLR
40.0000 mg | INTRAVENOUS | Status: DC
  Filled 2014-10-19: qty 40

## 2014-10-19 MED ORDER — PROTAMINE SULFATE 10 MG/ML IV SOLN
INTRAVENOUS | Status: DC | PRN
  Administered 2014-10-19: 50 mg via INTRAVENOUS

## 2014-10-19 MED ORDER — MAGNESIUM SULFATE 2 GM/50ML IV SOLN: 2.0000 g | Freq: Every day | INTRAVENOUS | Status: DC | PRN

## 2014-10-19 MED ORDER — MIDAZOLAM HCL 2 MG/2ML IJ SOLN
INTRAMUSCULAR | Status: AC
  Filled 2014-10-19: qty 4

## 2014-10-19 MED ORDER — DEXAMETHASONE SODIUM PHOSPHATE 10 MG/ML IJ SOLN
INTRAMUSCULAR | Status: AC
  Filled 2014-10-19: qty 1

## 2014-10-19 MED ORDER — INSULIN ASPART 100 UNIT/ML ~~LOC~~ SOLN: 10.0000 [IU] | Freq: Once | SUBCUTANEOUS | Status: DC

## 2014-10-19 MED ORDER — ENOXAPARIN SODIUM 30 MG/0.3ML ~~LOC~~ SOLN
30.0000 mg | SUBCUTANEOUS | Status: DC
  Filled 2014-10-19 (×2): qty 0.3

## 2014-10-19 MED ORDER — DEXTROSE 5 % IV SOLN
1.5000 g | INTRAVENOUS | Status: AC
  Administered 2014-10-19 (×2): 1.5 g via INTRAVENOUS
  Filled 2014-10-19: qty 1.5

## 2014-10-19 MED ORDER — LIDOCAINE HCL (CARDIAC) 20 MG/ML IV SOLN
INTRAVENOUS | Status: AC
  Filled 2014-10-19: qty 5

## 2014-10-19 MED ORDER — PROPOFOL 10 MG/ML IV BOLUS
INTRAVENOUS | Status: AC
  Filled 2014-10-19: qty 20

## 2014-10-19 MED ORDER — SODIUM BICARBONATE 4.2 % IV SOLN
INTRAVENOUS | Status: DC | PRN
  Administered 2014-10-19 (×5): 50 meq via INTRAVENOUS

## 2014-10-19 MED ORDER — 0.9 % SODIUM CHLORIDE (POUR BTL) OPTIME
TOPICAL | Status: DC | PRN
  Administered 2014-10-19: 1000 mL

## 2014-10-19 MED ORDER — SODIUM CHLORIDE 0.9 % IV BOLUS (SEPSIS)
1000.0000 mL | Freq: Once | INTRAVENOUS | Status: AC
  Administered 2014-10-19: 1000 mL via INTRAVENOUS

## 2014-10-19 MED ORDER — CHLORHEXIDINE GLUCONATE 0.12% ORAL RINSE (MEDLINE KIT)
15.0000 mL | Freq: Two times a day (BID) | OROMUCOSAL | Status: DC
  Administered 2014-10-19 – 2014-10-21 (×4): 15 mL via OROMUCOSAL

## 2014-10-19 MED ORDER — DEXTROSE 5 % IV SOLN
1.5000 g | INTRAVENOUS | Status: DC
  Filled 2014-10-19: qty 1.5

## 2014-10-19 MED ORDER — DEXAMETHASONE SODIUM PHOSPHATE 10 MG/ML IJ SOLN
INTRAMUSCULAR | Status: DC | PRN
  Administered 2014-10-19: 10 mg via INTRAVENOUS

## 2014-10-19 MED ORDER — EPHEDRINE SULFATE 50 MG/ML IJ SOLN
INTRAMUSCULAR | Status: AC
  Filled 2014-10-19: qty 1

## 2014-10-19 MED ORDER — ONDANSETRON HCL 4 MG/2ML IJ SOLN
INTRAMUSCULAR | Status: AC
  Filled 2014-10-19: qty 2

## 2014-10-19 MED ORDER — ALBUTEROL SULFATE (2.5 MG/3ML) 0.083% IN NEBU: 2.5000 mg | INHALATION_SOLUTION | RESPIRATORY_TRACT | Status: DC | PRN

## 2014-10-19 MED ORDER — INSULIN ASPART 100 UNIT/ML ~~LOC~~ SOLN
SUBCUTANEOUS | Status: AC
  Filled 2014-10-19: qty 10

## 2014-10-19 MED ORDER — ANTISEPTIC ORAL RINSE SOLUTION (CORINZ)
7.0000 mL | Freq: Four times a day (QID) | OROMUCOSAL | Status: DC
  Administered 2014-10-19 – 2014-10-21 (×7): 7 mL via OROMUCOSAL

## 2014-10-19 MED ORDER — LIDOCAINE HCL (CARDIAC) 20 MG/ML IV SOLN
INTRAVENOUS | Status: DC | PRN
  Administered 2014-10-19: 100 mg via INTRAVENOUS

## 2014-10-19 MED ORDER — DEXTROSE 5 % IV SOLN
2.0000 ug/min | INTRAVENOUS | Status: DC
  Administered 2014-10-19: 5 ug/min via INTRAVENOUS
  Filled 2014-10-19 (×2): qty 16

## 2014-10-19 MED ORDER — MIDAZOLAM HCL 2 MG/2ML IJ SOLN
INTRAMUSCULAR | Status: AC
Start: 2014-10-19 — End: 2014-10-19
  Administered 2014-10-19: 2 mg via INTRAVENOUS
  Filled 2014-10-19: qty 2

## 2014-10-19 MED ORDER — PHENYLEPHRINE HCL 10 MG/ML IJ SOLN
0.0000 ug/min | INTRAMUSCULAR | Status: DC
  Administered 2014-10-19: 400 ug/min via INTRAVENOUS
  Administered 2014-10-20: 200 ug/min via INTRAVENOUS
  Administered 2014-10-20: 250 ug/min via INTRAVENOUS
  Filled 2014-10-19 (×5): qty 4

## 2014-10-19 MED ORDER — SODIUM CHLORIDE 0.9 % IV BOLUS (SEPSIS): 500.0000 mL | Freq: Once | INTRAVENOUS | Status: AC

## 2014-10-19 MED ORDER — HYDROMORPHONE HCL 1 MG/ML IJ SOLN: 0.2500 mg | INTRAMUSCULAR | Status: DC | PRN

## 2014-10-19 MED ORDER — SODIUM CHLORIDE 0.9 % IV SOLN
25.0000 ug/h | INTRAVENOUS | Status: DC
  Administered 2014-10-19 – 2014-10-20 (×2): 100 ug/h via INTRAVENOUS
  Filled 2014-10-19 (×2): qty 50

## 2014-10-19 MED ORDER — SODIUM CHLORIDE 0.9 % IV BOLUS (SEPSIS)
2000.0000 mL | Freq: Once | INTRAVENOUS | Status: AC
  Administered 2014-10-19: 2000 mL via INTRAVENOUS

## 2014-10-19 MED ORDER — EPHEDRINE SULFATE 50 MG/ML IJ SOLN
INTRAMUSCULAR | Status: DC | PRN
  Administered 2014-10-19 (×2): 5 mg via INTRAVENOUS

## 2014-10-19 MED ORDER — POLYETHYLENE GLYCOL 3350 17 G PO PACK
17.0000 g | PACK | Freq: Every day | ORAL | Status: DC | PRN
  Filled 2014-10-19: qty 1

## 2014-10-19 MED ORDER — PHENYLEPHRINE HCL 10 MG/ML IJ SOLN
0.0000 ug/min | INTRAMUSCULAR | Status: DC
  Administered 2014-10-19: 250 ug/min via INTRAVENOUS
  Administered 2014-10-19: 30 ug/min via INTRAVENOUS
  Filled 2014-10-19 (×2): qty 2

## 2014-10-19 MED ORDER — SODIUM BICARBONATE 8.4 % IV SOLN
INTRAVENOUS | Status: AC
  Filled 2014-10-19: qty 50

## 2014-10-19 MED ORDER — MEPERIDINE HCL 25 MG/ML IJ SOLN: 6.2500 mg | INTRAMUSCULAR | Status: DC | PRN

## 2014-10-19 MED ORDER — HEPARIN SODIUM (PORCINE) 1000 UNIT/ML IJ SOLN
INTRAMUSCULAR | Status: DC | PRN
  Administered 2014-10-19 (×2): 2 mL via INTRAVENOUS
  Administered 2014-10-19: 7 mL via INTRAVENOUS
  Administered 2014-10-19: 2 mL via INTRAVENOUS

## 2014-10-19 MED ORDER — SODIUM CHLORIDE 0.9 % IV SOLN: Freq: Once | INTRAVENOUS | Status: DC

## 2014-10-19 MED ORDER — HEPARIN SODIUM (PORCINE) 1000 UNIT/ML IJ SOLN
INTRAMUSCULAR | Status: AC
  Filled 2014-10-19: qty 1

## 2014-10-19 MED ORDER — ALBUMIN HUMAN 5 % IV SOLN
INTRAVENOUS | Status: DC | PRN
  Administered 2014-10-19 (×2): via INTRAVENOUS

## 2014-10-19 MED ORDER — DOCUSATE SODIUM 50 MG/5ML PO LIQD
100.0000 mg | Freq: Two times a day (BID) | ORAL | Status: DC | PRN
  Filled 2014-10-19: qty 10

## 2014-10-19 MED ORDER — DOCUSATE SODIUM 100 MG PO CAPS
100.0000 mg | ORAL_CAPSULE | Freq: Every day | ORAL | Status: DC
  Filled 2014-10-19: qty 1

## 2014-10-19 MED ORDER — MIDAZOLAM HCL 5 MG/5ML IJ SOLN
INTRAMUSCULAR | Status: DC | PRN
  Administered 2014-10-19 (×2): 1 mg via INTRAVENOUS

## 2014-10-19 MED ORDER — ONDANSETRON HCL 4 MG/2ML IJ SOLN: 4.0000 mg | Freq: Once | INTRAMUSCULAR | Status: DC | PRN

## 2014-10-19 MED ORDER — PHENYLEPHRINE 40 MCG/ML (10ML) SYRINGE FOR IV PUSH (FOR BLOOD PRESSURE SUPPORT)
PREFILLED_SYRINGE | INTRAVENOUS | Status: AC
  Filled 2014-10-19: qty 10

## 2014-10-19 MED ORDER — PROPOFOL 10 MG/ML IV BOLUS
INTRAVENOUS | Status: DC | PRN
  Administered 2014-10-19: 120 mg via INTRAVENOUS

## 2014-10-19 MED ORDER — PROPOFOL INFUSION 10 MG/ML OPTIME
INTRAVENOUS | Status: DC | PRN
  Administered 2014-10-19: 50 ug/kg/min via INTRAVENOUS

## 2014-10-19 MED ORDER — FENTANYL CITRATE (PF) 100 MCG/2ML IJ SOLN: 50.0000 ug | Freq: Once | INTRAMUSCULAR | Status: DC

## 2014-10-19 MED ORDER — FENTANYL BOLUS VIA INFUSION
25.0000 ug | INTRAVENOUS | Status: DC | PRN
  Filled 2014-10-19: qty 25

## 2014-10-19 MED ORDER — SODIUM CHLORIDE 0.9 % IJ SOLN
INTRAMUSCULAR | Status: AC
  Filled 2014-10-19: qty 10

## 2014-10-19 MED ORDER — FAMOTIDINE IN NACL 20-0.9 MG/50ML-% IV SOLN
20.0000 mg | Freq: Two times a day (BID) | INTRAVENOUS | Status: DC
  Administered 2014-10-19: 20 mg via INTRAVENOUS
  Filled 2014-10-19: qty 50

## 2014-10-19 MED ORDER — DOCUSATE SODIUM 50 MG/5ML PO LIQD: 100.0000 mg | Freq: Two times a day (BID) | ORAL | Status: DC | PRN

## 2014-10-19 MED ORDER — DEXTROSE 5 % IV SOLN
0.0000 ug/min | INTRAVENOUS | Status: DC
  Filled 2014-10-19: qty 1

## 2014-10-19 MED ORDER — DEXTROSE 50 % IV SOLN
INTRAVENOUS | Status: AC
  Administered 2014-10-19: 50 mL via INTRAVENOUS
  Filled 2014-10-19: qty 50

## 2014-10-19 MED ORDER — ROCURONIUM BROMIDE 100 MG/10ML IV SOLN
INTRAVENOUS | Status: DC | PRN
  Administered 2014-10-19: 50 mg via INTRAVENOUS

## 2014-10-19 MED ORDER — FENTANYL CITRATE (PF) 100 MCG/2ML IJ SOLN
INTRAMUSCULAR | Status: DC | PRN
Start: 2014-10-19 — End: 2014-10-19
  Administered 2014-10-19: 50 ug via INTRAVENOUS
  Administered 2014-10-19: 250 ug via INTRAVENOUS
  Administered 2014-10-19: 100 ug via INTRAVENOUS
  Administered 2014-10-19 (×4): 50 ug via INTRAVENOUS
  Administered 2014-10-19: 100 ug via INTRAVENOUS
  Administered 2014-10-19: 50 ug via INTRAVENOUS

## 2014-10-19 SURGICAL SUPPLY — 107 items
ARMADA 35 PTA CATHETER BALLOON ×3 IMPLANT
BAG DECANTER FOR FLEXI CONT (MISCELLANEOUS) IMPLANT
BAG SNAP BAND KOVER 36X36 (MISCELLANEOUS) ×3 IMPLANT
BALLN ARMADA 5X40X135 (BALLOONS) ×3
BALLN CODA OCL 2-9.0-35-120-3 (BALLOONS) ×3
BALLN MUSTANG 9X20X75 (BALLOONS) ×3
BALLOON ARMADA 5X40X135 (BALLOONS) ×1 IMPLANT
BALLOON COD OCL 2-9.0-35-120-3 (BALLOONS) ×1 IMPLANT
BALLOON MUSTANG 9X20X75 (BALLOONS) ×1 IMPLANT
CANISTER SUCTION 2500CC (MISCELLANEOUS) ×3 IMPLANT
CATH ANGIO 5F BER2 65CM (CATHETERS) ×3 IMPLANT
CATH BEACON 5 .035 65 C1 TIP (CATHETERS) ×3 IMPLANT
CATH BEACON 5 .035 65 VANSC3 (CATHETERS) ×3 IMPLANT
CATH CROSS OVER TEMPO 5F (CATHETERS) ×3 IMPLANT
CATH OMNI FLUSH .035X70CM (CATHETERS) ×3 IMPLANT
CATH QUICKCROSS SUPP .035X90CM (MICROCATHETER) ×3 IMPLANT
CATH SOFTOUCH MOTARJEME 5F (CATHETERS) ×3 IMPLANT
COVER PROBE W GEL 5X96 (DRAPES) IMPLANT
DEVICE CLOSURE PERCLS PRGLD 6F (VASCULAR PRODUCTS) ×6 IMPLANT
DEVICE TORQUE H2O (MISCELLANEOUS) ×3 IMPLANT
DRAIN CHANNEL 10F 3/8 F FF (DRAIN) IMPLANT
DRAIN CHANNEL 10M FLAT 3/4 FLT (DRAIN) IMPLANT
DRAPE ZERO GRAVITY STERILE (DRAPES) ×3 IMPLANT
DRSG TEGADERM 2-3/8X2-3/4 SM (GAUZE/BANDAGES/DRESSINGS) ×6 IMPLANT
DRYSEAL FLEXSHEATH 16FR 33CM (SHEATH) ×2
DRYSEAL FLEXSHEATH 18FR 33CM (SHEATH) ×2
DRYSEAL FLEXSHEATH 20FR 33CM (SHEATH) ×2
ELECT CAUTERY BLADE 6.4 (BLADE) IMPLANT
ELECT REM PT RETURN 9FT ADLT (ELECTROSURGICAL) ×6
ELECTRODE REM PT RTRN 9FT ADLT (ELECTROSURGICAL) ×2 IMPLANT
EVACUATOR 3/16  PVC DRAIN (DRAIN)
EVACUATOR 3/16 PVC DRAIN (DRAIN) IMPLANT
EVACUATOR SILICONE 100CC (DRAIN) IMPLANT
GAUZE SPONGE 2X2 8PLY STRL LF (GAUZE/BANDAGES/DRESSINGS) ×2 IMPLANT
GAUZE SPONGE 4X4 16PLY XRAY LF (GAUZE/BANDAGES/DRESSINGS) ×3 IMPLANT
GLOVE BIO SURGEON STRL SZ 6.5 (GLOVE) ×6 IMPLANT
GLOVE BIO SURGEONS STRL SZ 6.5 (GLOVE) ×3
GLOVE BIOGEL PI IND STRL 6.5 (GLOVE) ×4 IMPLANT
GLOVE BIOGEL PI IND STRL 7.0 (GLOVE) ×3 IMPLANT
GLOVE BIOGEL PI IND STRL 7.5 (GLOVE) ×2 IMPLANT
GLOVE BIOGEL PI INDICATOR 6.5 (GLOVE) ×8
GLOVE BIOGEL PI INDICATOR 7.0 (GLOVE) ×6
GLOVE BIOGEL PI INDICATOR 7.5 (GLOVE) ×4
GLOVE ECLIPSE 6.5 STRL STRAW (GLOVE) ×6 IMPLANT
GLOVE SS BIOGEL STRL SZ 7.5 (GLOVE) ×1 IMPLANT
GLOVE SUPERSENSE BIOGEL SZ 7.5 (GLOVE) ×2
GLOVE SURG SS PI 7.0 STRL IVOR (GLOVE) ×3 IMPLANT
GLOVE SURG SS PI 7.5 STRL IVOR (GLOVE) ×6 IMPLANT
GOWN STRL REUS W/ TWL LRG LVL3 (GOWN DISPOSABLE) ×8 IMPLANT
GOWN STRL REUS W/ TWL XL LVL3 (GOWN DISPOSABLE) ×3 IMPLANT
GOWN STRL REUS W/TWL LRG LVL3 (GOWN DISPOSABLE) ×16
GOWN STRL REUS W/TWL XL LVL3 (GOWN DISPOSABLE) ×6
GRAFT BALLN CATH 65CM (STENTS) IMPLANT
GRAFT FEN DIST EVAR 16X62X76 (Graft) ×3 IMPLANT
GRAFT FEN PROX EVAR 28X124M (Graft) ×3 IMPLANT
GRAFT LEG ILIAC ZSLE-13-74-ZT (Endovascular Graft) ×3 IMPLANT
GRAFT RT ILIAC AAA LEG EXTENS (Graft) ×3 IMPLANT
GUIDEWIRE ANGLED .035X150CM (WIRE) ×3 IMPLANT
GUIDEWIRE ANGLED .035X260CM (WIRE) ×3 IMPLANT
HEMOSTAT SNOW SURGICEL 2X4 (HEMOSTASIS) IMPLANT
KIT BASIN OR (CUSTOM PROCEDURE TRAY) ×3 IMPLANT
KIT ENCORE 26 ADVANTAGE (KITS) ×3 IMPLANT
KIT ROOM TURNOVER OR (KITS) ×3 IMPLANT
LIQUID BAND (GAUZE/BANDAGES/DRESSINGS) ×6 IMPLANT
NEEDLE PERC 18GX7CM (NEEDLE) ×3 IMPLANT
NS IRRIG 1000ML POUR BTL (IV SOLUTION) ×3 IMPLANT
PACK ENDOVASCULAR (PACKS) IMPLANT
PAD ARMBOARD 7.5X6 YLW CONV (MISCELLANEOUS) ×6 IMPLANT
PENCIL BUTTON HOLSTER BLD 10FT (ELECTRODE) IMPLANT
PERCLOSE PROGLIDE 6F (VASCULAR PRODUCTS) ×18
SHEATH AVANTI 11CM 8FR (MISCELLANEOUS) IMPLANT
SHEATH DRYSEAL FLEX 16FR 33CM (SHEATH) ×1 IMPLANT
SHEATH DRYSEAL FLEX 18FR 33CM (SHEATH) ×1 IMPLANT
SHEATH DRYSEAL FLEX 20FR 33CM (SHEATH) ×1 IMPLANT
SHEATH HIGHFLEX ANSEL 6FRX55 (SHEATH) ×6 IMPLANT
SHEATH HIGHFLEX ANSEL 7FR 55CM (SHEATH) ×3 IMPLANT
SHIELD RADPAD SCOOP 12X17 (MISCELLANEOUS) ×6 IMPLANT
SPONGE GAUZE 2X2 STER 10/PKG (GAUZE/BANDAGES/DRESSINGS) ×4
STENT GRAFT BALLN CATH 65CM (STENTS)
STENT ICAST 6X22X80 (Permanent Stent) ×3 IMPLANT
STENT ICAST 7X22X80 (Permanent Stent) ×3 IMPLANT
STOPCOCK MORSE 400PSI 3WAY (MISCELLANEOUS) ×3 IMPLANT
SUT ETHILON 3 0 PS 1 (SUTURE) IMPLANT
SUT PROLENE 5 0 C 1 24 (SUTURE) IMPLANT
SUT PROLENE 6 0 BV (SUTURE) IMPLANT
SUT SILK 2 0 (SUTURE)
SUT SILK 2 0 FS (SUTURE) ×3 IMPLANT
SUT SILK 2-0 18XBRD TIE 12 (SUTURE) IMPLANT
SUT SILK 3 0 (SUTURE)
SUT SILK 3-0 18XBRD TIE 12 (SUTURE) IMPLANT
SUT SILK 4 0 (SUTURE)
SUT SILK 4-0 18XBRD TIE 12 (SUTURE) IMPLANT
SUT VIC AB 2-0 CT1 27 (SUTURE)
SUT VIC AB 2-0 CT1 TAPERPNT 27 (SUTURE) IMPLANT
SUT VIC AB 3-0 SH 27 (SUTURE)
SUT VIC AB 3-0 SH 27X BRD (SUTURE) IMPLANT
SUT VICRYL 4-0 PS2 18IN ABS (SUTURE) ×6 IMPLANT
SYR 30ML LL (SYRINGE) IMPLANT
SYRINGE 10CC LL (SYRINGE) ×3 IMPLANT
TRAY FOLEY W/METER SILVER 16FR (SET/KITS/TRAYS/PACK) ×3 IMPLANT
TUBING HIGH PRESSURE 120CM (CONNECTOR) ×3 IMPLANT
WIRE AMPLATZ SS-J .035X180CM (WIRE) IMPLANT
WIRE BENTSON .035X145CM (WIRE) IMPLANT
WIRE HI TORQ VERSACORE J 260CM (WIRE) ×6 IMPLANT
WIRE ROSEN-J .035X260CM (WIRE) ×6 IMPLANT
WIRE SPARTACORE .014X190CM (WIRE) ×3 IMPLANT
WIRE STIFF LUNDERQUIST 260MM (WIRE) ×6 IMPLANT

## 2014-10-19 SURGICAL SUPPLY — 42 items
BAG ISOLATION DRAPE 18X18 (DRAPES) IMPLANT
BANDAGE ELASTIC 4 VELCRO ST LF (GAUZE/BANDAGES/DRESSINGS) IMPLANT
BANDAGE ELASTIC 6 VELCRO ST LF (GAUZE/BANDAGES/DRESSINGS) IMPLANT
BNDG GAUZE ELAST 4 BULKY (GAUZE/BANDAGES/DRESSINGS) IMPLANT
CANISTER SUCTION 2500CC (MISCELLANEOUS) IMPLANT
CANISTER WOUND CARE 500ML ATS (WOUND CARE) IMPLANT
CLIP TI MEDIUM 6 (CLIP) IMPLANT
CLIP TI WIDE RED SMALL 6 (CLIP) IMPLANT
CONNECTOR Y ATS VAC SYSTEM (MISCELLANEOUS) ×2 IMPLANT
DRAPE EXTREMITY BILATERAL (DRAPE) IMPLANT
DRAPE EXTREMITY T 121X128X90 (DRAPE) IMPLANT
DRAPE IMP U-DRAPE 54X76 (DRAPES) ×4 IMPLANT
DRAPE ISOLATION BAG 18X18 (DRAPES)
DRAPE PROXIMA HALF (DRAPES) ×2 IMPLANT
DRAPE U-SHAPE 76X120 STRL (DRAPES) IMPLANT
DRSG VAC ATS MED SENSATRAC (GAUZE/BANDAGES/DRESSINGS) ×2 IMPLANT
ELECT REM PT RETURN 9FT ADLT (ELECTROSURGICAL) ×2
ELECTRODE REM PT RTRN 9FT ADLT (ELECTROSURGICAL) ×1 IMPLANT
GAUZE SPONGE 4X4 12PLY STRL (GAUZE/BANDAGES/DRESSINGS) ×6 IMPLANT
GAUZE XEROFORM 5X9 LF (GAUZE/BANDAGES/DRESSINGS) ×2 IMPLANT
GLOVE BIOGEL PI IND STRL 7.5 (GLOVE) ×1 IMPLANT
GLOVE BIOGEL PI INDICATOR 7.5 (GLOVE) ×1
GLOVE SURG SS PI 7.5 STRL IVOR (GLOVE) ×2 IMPLANT
GOWN STRL REUS W/ TWL LRG LVL3 (GOWN DISPOSABLE) ×2 IMPLANT
GOWN STRL REUS W/ TWL XL LVL3 (GOWN DISPOSABLE) ×1 IMPLANT
GOWN STRL REUS W/TWL LRG LVL3 (GOWN DISPOSABLE) ×2
GOWN STRL REUS W/TWL XL LVL3 (GOWN DISPOSABLE) ×1
KIT BASIN OR (CUSTOM PROCEDURE TRAY) IMPLANT
KIT ROOM TURNOVER OR (KITS) IMPLANT
NS IRRIG 1000ML POUR BTL (IV SOLUTION) ×2 IMPLANT
PACK CV ACCESS (CUSTOM PROCEDURE TRAY) IMPLANT
PACK GENERAL/GYN (CUSTOM PROCEDURE TRAY) IMPLANT
PACK UNIVERSAL I (CUSTOM PROCEDURE TRAY) IMPLANT
PAD ARMBOARD 7.5X6 YLW CONV (MISCELLANEOUS) IMPLANT
PAD NEG PRESSURE SENSATRAC (MISCELLANEOUS) ×2 IMPLANT
SUT ETHILON 3 0 PS 1 (SUTURE) IMPLANT
SUT VIC AB 2-0 CTX 36 (SUTURE) IMPLANT
SUT VIC AB 3-0 SH 27 (SUTURE)
SUT VIC AB 3-0 SH 27X BRD (SUTURE) IMPLANT
SUT VICRYL 4-0 PS2 18IN ABS (SUTURE) IMPLANT
TOWEL NATURAL 10PK STERILE (DISPOSABLE) ×2 IMPLANT
WATER STERILE IRR 1000ML POUR (IV SOLUTION) IMPLANT

## 2014-10-19 NOTE — Consult Note (Signed)
PULMONARY / CRITICAL CARE MEDICINE   Name:Alfred Hall NFA:[id number] DOB:October 09, 1929   ADMISSION DATE: 11/06/2014 CONSULTATION DATE: 01/18/2015  REFERRING MD : Dr. Trula Slade, vascular surgery  CHIEF COMPLAINT: Postoperative medical management and ventilator management  INITIAL PRESENTATION: 81 year old male with a history of hypertension and peripheral vascular disease was admitted on 11/05/2014. He had an endovascular repair of an abdominal aortic aneurysm on 10/22/2014. This procedure was complicated by left leg ischemia requiring a fasciotomy for compartment syndrome.  STUDIES:    SIGNIFICANT EVENTS: 11/09/2014 endovascular repair of juxtarenal aortic aneurysm 11/11/2014 emergent left leg fasciotomy due to leg ischemia and reperfusion injury   HISTORY OF PRESENT ILLNESS: This is an 81 year old mouth a history of peripheral vascular disease, lung cancer, and hypertension who was admitted for an endovascular repair of an abdominal aortic aortic aneurysm on 11/01/2014. The procedures, located by left leg ischemia and likely reperfusion injury. This developed into compartment syndrome so he required an emergent fasciotomy on 11/08/2014.  PAST MEDICAL HISTORY :   has a past medical history of PVD (peripheral vascular disease); Microhematuria; Aortic aneurysm, abdominal; Hypercholesteremia; Hypertension; Thoracic aortic aneurysm without rupture; Aortic stenosis; Tobacco abuse; CAD (coronary artery disease); COPD (chronic obstructive pulmonary disease); Hypoglycemia; Abdominal aortic aneurysm; Wears glasses; Lung cancer; Radiation; History of blood transfusion; and Gout.  has past surgical history that includes Cataract extraction w/ intraocular lens implant (Right); throat nodule excision; Nasal hemorrhage control; Cardiac catheterization; Tonsillectomy; Appendectomy; Ascending aortic root replacement (N/A, 12/26/2013); Intraoprative transesophageal echocardiogram (N/A,  12/26/2013); Lung biopsy (Left, 12/26/2013); Coronary artery bypass graft (N/A, 12/26/2013); Aortic valve replacement (12/26/2013); left and right heart catheterization with coronary angiogram (N/A, 09/18/2013); Cataract extraction (Left); and Cardiac valve replacement. Prior to Admission medications   Medication Sig Start Date End Date Taking? Authorizing Provider  aspirin EC 81 MG tablet Take 1 tablet (81 mg total) by mouth daily. 09/27/14  Yes Grace Isaac, MD  atorvastatin (LIPITOR) 20 MG tablet Take 1 tablet (20 mg total) by mouth daily at 6 PM. 04/06/14  Yes Thayer Headings, MD  cholecalciferol (VITAMIN D) 1000 UNITS tablet Take 1,000 Units by mouth daily.    Yes Historical Provider, MD  Garlic 469 MG CAPS Take 300 mg by mouth daily.    Yes Historical Provider, MD  metoprolol tartrate (LOPRESSOR) 25 MG tablet Take 0.5 tablets (12.5 mg total) by mouth 2 (two) times daily. 04/06/14  Yes Thayer Headings, MD  Multiple Vitamins-Minerals (CENTRUM SILVER ADULT 50+ PO) Take 1 tablet by mouth daily.   Yes Historical Provider, MD  amiodarone (PACERONE) 200 MG tablet TAKE 1 TABLET BY MOUTH EVERY DAY 10/15/14   Thayer Headings, MD   No Known Allergies  FAMILY HISTORY:  indicated that his mother is deceased. He indicated that his father is deceased. He indicated that his maternal grandmother is deceased. He indicated that his maternal grandfather is deceased. He indicated that his paternal grandmother is deceased. He indicated that his paternal grandfather is deceased.  SOCIAL HISTORY:  reports that he quit smoking about 9 months ago. His smoking use included Cigarettes. He has a 30 pack-year smoking history. He has never used smokeless tobacco. He reports that he drinks alcohol. He reports that he does not use illicit drugs.  REVIEW OF SYSTEMS:  Unattainable.  SUBJECTIVE: Sedated and intubated post op.  VITAL SIGNS: Temp:  [97 F (36.1 C)-98 F (36.7 C)] 97.4 F (36.3 C) (09/02 1637) Pulse Rate:   [57-63] 63 (09/02 1745) Resp:  [0-24] 22 (09/02  1745) BP: (99-191)/(26-76) 99/47 mmHg (09/02 1745) SpO2:  [98 %-100 %] 100 % (09/02 1745) Arterial Line BP: (85-119)/(37-48) 105/40 mmHg (09/02 1637) FiO2 (%):  [100 %] 100 % (09/02 1745) HEMODYNAMICS:   VENTILATOR SETTINGS: Vent Mode:  [-] PRVC FiO2 (%):  [100 %] 100 % Set Rate:  [16 bmp] 16 bmp Vt Set:  [550 mL] 550 mL PEEP:  [5 cmH20] 5 cmH20 Plateau Pressure:  [17 cmH20] 17 cmH20 INTAKE / OUTPUT:  Intake/Output Summary (Last 24 hours) at 11/12/2014 1803 Last data filed at 10/29/2014 1637  Gross per 24 hour  Intake   4670 ml  Output   2435 ml  Net   2235 ml    PHYSICAL EXAMINATION: General:  Chronically ill elderly appearing male. Neuro:  Sedated, intubated and paralyzed, unable to assess. HEENT:  [place]/AT, pupil sluggish but reactive, no EOM and MMM. Cardiovascular:  RRR, Nl S1/S2, -M/R/G. Lungs:  Coarse BS diffusely. Abdomen:  Soft, NT, ND and no BS. Musculoskeletal:  Cyanotic bilateral feet. Skin:  Surgical sites noted, no puss, cyanotic feet.  LABS:  CBC  Recent Labs Lab 11/06/2014 1514 11/14/2014 0432 10/23/2014 1416 11/14/2014 1511 11/14/2014 1539  WBC 6.4 7.7  --   --  16.8*  HGB 10.8* 11.2* 7.8* 10.5* 11.2*  HCT 35.1* 35.9* 23.0* 31.0* 34.5*  PLT 238 255  --   --  108*   Coag's  Recent Labs Lab 11/03/2014 1514 11/16/2014 1539  APTT  --  86*  INR 1.19 2.30*   BMET  Recent Labs Lab 11/09/2014 1514 11/16/2014 0432 10/29/2014 1416 10/31/2014 1511 10/22/2014 1539  NA 136 136 135 138 141  K 4.4 4.7 6.1* 6.5* 7.0*  CL 102 101  --   --  99*  CO2 27 26  --   --  22  BUN 30* 23*  --   --  20  CREATININE 1.85* 1.74*  --   --  2.07*  GLUCOSE 141* 99  --   --  147*   Electrolytes  Recent Labs Lab 10/30/2014 1514 11/12/2014 0432 10/28/2014 1539  CALCIUM 8.8* 9.0 7.2*  MG  --   --  2.3   Sepsis Markers No results for input(s): LATICACIDVEN, PROCALCITON, O2SATVEN in the last 168 hours. ABG  Recent Labs Lab  11/08/2014 1416 11/06/2014 1511 10/24/2014 1617  PHART 7.060* 7.184* 7.307*  PCO2ART 58.6* 48.6* 38.9  PO2ART 141.0* 118.0* 273*   Liver Enzymes  Recent Labs Lab 11/06/2014 1514  AST 29  ALT 25  ALKPHOS 54  BILITOT 0.6  ALBUMIN 3.4*   Cardiac Enzymes No results for input(s): TROPONINI, PROBNP in the last 168 hours. Glucose No results for input(s): GLUCAP in the last 168 hours.  Imaging Dg Chest Port 1 View  11/15/2014   CLINICAL DATA:  Status post or percutaneous abdominal aortic aneurysm repair, postoperative shortness of breath.  EXAM: PORTABLE CHEST - 1 VIEW  COMPARISON:  PA and lateral chest x-ray of October 18, 2014  FINDINGS: Previously demonstrated left upper lobe mass is again demonstrated. The surrounding interstitial markings are more prominent. Elsewhere in both lungs the pulmonary interstitial markings are mildly increased. There is increased density in the retrocardiac region on the left. Mildly increased interstitial markings in the right lung are stable. The heart is normal in size. A prosthetic aortic valve ring is visible. There are 7 intact sternal wires. The endotracheal tube tip lies 4 cm above the carina. The esophagogastric tube tip terminates above the GE junction and advancement  by approximately 15-20 cm is recommended. The abdominal aortic stent graft superior age is visible at the inferior margin of the image.  IMPRESSION: 1. The left upper lobe interstitial markings associated with the known spiculated upper lobe mass are more conspicuous on this postoperative portable supine view. The interstitial markings in the right lung are mildly increased as well which suggests mild interstitial edema. There is new retrocardiac density on the left consistent with atelectasis. There is no significant pleural effusion. 2. The endotracheal tube is in reasonable position. 3. The esophagogastric tube tip lies above the GE junction. Advancement by 15-20 cm is recommended.    Electronically Signed   By: David  Martinique M.D.   On: 10/23/2014 15:59   Dg Abd Portable 1v  11/04/2014   CLINICAL DATA:  Status post percutaneous abdominal aortic aneurysm repair.  EXAM: PORTABLE ABDOMEN - 1 VIEW  COMPARISON:  CT 08/03/2014  FINDINGS: Status post aorta iliac stent graft. Patient has bilateral renal and left external iliac stents. Significant atherosclerotic calcification of the iliac and femoral arteries. Bowel gas pattern is nonobstructed.  IMPRESSION: Status post aorta iliac stent graft placement.   Electronically Signed   By: Nolon Nations M.D.   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, status post radiation therapy P:  Full vent support, 8 mL per K tidal volume, respiratory rate 14 ABG in one hour Chest x-ray now As needed albuterol Spontaneous breathing trial and morning  CARDIOVASCULAR CVL none A: Mild hypotension after emergent surgery: Suspect due to sedation; Intraoperatively he received over 3-1/2 L plus albumen and blood, EBL 275 ML's, urine output was adequate History of hypertension Aortic stenosis, status post bioprosthetic valve LVEF 45% on August 2016 echocardiogram P:  Twelve-lead now Hold beta blocker Neo-Synephrine as needed for map greater than 65 Bolus 500 mL saline now  Continue home dose of amiodarone, not clear why he's on this  RENAL A: Acute on chronic renal insufficiency in setting of hypotension, most likely prerenal P:  Monitor BMET and UOP Replace electrolytes as needed Gentle IV fluids  GASTROINTESTINAL A: No acute issues P:  Pepcid for stress ulcer prophylaxis Nothing by mouth for now, consider tube feeds if not off ventilator tomorrow  HEMATOLOGIC A: Mild anemia postoperatively, no evidence of bleeding at this time P:  Monitor for bleeding Daily  CBC  INFECTIOUS A: No acute issues P:   Abx: Perioperative cefuroxime 11/01/2014  ENDOCRINE A: No acute issues  P:  Monitor glucose  NEUROLOGIC A: Sedation needs for vent synchrony P:  RASS goal: -1 Fentanyl drip   FAMILY  - Updates: No family bedside.  The patient is critically ill with multiple organ systems failure and requires high complexity decision making for assessment and support, frequent evaluation and titration of therapies, application of advanced monitoring technologies and extensive interpretation of multiple databases.   Critical Care Time devoted to patient care services described in this note is  35  Minutes. This time reflects time of care of this signee Dr Jennet Maduro. This critical care time does not reflect procedure time, or teaching time or supervisory time of PA/NP/Med student/Med Resident etc but could involve care discussion time.  Rush Farmer, M.D. [facility] Pulmonary/Critical Care Medicine. Pager: [phone]. After hours pager: [phone].  10/30/2014, 6:03 PM

## 2014-10-19 NOTE — Progress Notes (Addendum)
S/p FEVAR  Sheath occlusion of EIA for duration pf procedure Left calf concerning for compartment syndrome Will proceed with fasciotomy.  Spoke with family, and they are in agreement  [employer]

## 2014-10-19 NOTE — H&P (Signed)
Patient name: Alfred Hall: [id number] DOB: 1931/01/29Sex: male   No chief complaint on file.   HISTORY OF PRESENT ILLNESS: The patient is back for follow-up. He was being evaluated for a fenestrated endovascular aneurysm repair when he became formally diagnosed with lung cancer. He has recently completed radiation treatment. He is here today to discuss aneurysm repair. He has a history of an ascending aortic aneurysm repair with valve replacement and CABG November 2015. He suffers from hypercholesterolemia which was managed with a statin. He is a former smoker  He appears to have tolerated his radiation therapy very well. He does have occasional weakness but no significant pain or shortness of breath.  Past Medical History  Diagnosis Date  . PVD (peripheral vascular disease)   . Microhematuria     a. workup negative  . Aortic aneurysm, abdominal   . Hypercholesteremia   . Hypertension   . Thoracic aortic aneurysm without rupture   . Aortic stenosis     a. ECHO at [place]- pk AV grad 77 with a mean rate of 53   . Tobacco abuse   . CAD (coronary artery disease)     a. s/p LHC on 09/18/13 with non-obs disease and severe AS  . COPD (chronic obstructive pulmonary disease)   . Hypoglycemia   . Abdominal aortic aneurysm     Past Surgical History  Procedure Laterality Date  . Cataract extraction    . Eye surgery      CATARACT  . Throat nodule excision    . Nose surgery    . Cardiac catheterization    . Tonsillectomy    . Appendectomy    . Ascending aortic root replacement N/A 12/26/2013    Procedure: ASCENDING AORTIC REPLACEMENT; Surgeon: Grace Isaac, MD; Location: [place]; Service: Open Heart Surgery; Laterality: N/A;  . Intraoperative transesophageal echocardiogram N/A 12/26/2013    Procedure: INTRAOPERATIVE TRANSESOPHAGEAL  ECHOCARDIOGRAM; Surgeon: Grace Isaac, MD; Location: [place]; Service: Open Heart Surgery; Laterality: N/A;  . Lung biopsy Left 12/26/2013    Procedure: LUNG BIOPSY; Surgeon: Grace Isaac, MD; Location: [place]; Service: Open Heart Surgery; Laterality: Left;  . Coronary artery bypass graft N/A 12/26/2013    Procedure: CORONARY ARTERY BYPASS GRAFTING (CABG) times one using the left internal mammary artery.; Surgeon: Grace Isaac, MD; Location: [place]; Service: Open Heart Surgery; Laterality: N/A;  . Aortic valve replacement  12/26/2013    Procedure: AORTIC VALVE REPLACEMENT (AVR); Surgeon: Grace Isaac, MD; Location: [place]; Service: Open Heart Surgery;;  . Left and right heart catheterization with coronary angiogram N/A 09/18/2013    Procedure: LEFT AND RIGHT HEART CATHETERIZATION WITH CORONARY ANGIOGRAM; Surgeon: Blane Ohara, MD; Location[facility] C[facility] LAB; Service: Cardiovascular; Laterality: N/A;    History   Social History  . Marital Status: Married    Spouse Name: N/A  . Number of Children: 3  . Years of Education: N/A   Occupational History  . retired from [employer]  . Smoking status: Former Smoker -- 0.50 packs/day for 60 years    Types: Cigarettes    Quit date: 12/22/2013  . Smokeless tobacco: Never Used  . Alcohol Use: 0.0 oz/week    0 Standard drinks or equivalent per week     Comment: " occasional beer"  . Drug Use: No  . Sexual Activity: Not on file   Other Topics Concern  . Not on file   Social History  Narrative    Family History  Problem Relation Age of Onset  . Renal Disease Father   . Congestive Heart Failure Mother     Allergies as of 07/23/2014  . (No Known Allergies)    Current Outpatient Prescriptions on File Prior to Visit  Medication Sig Dispense Refill  .  amiodarone (PACERONE) 200 MG tablet Take 1 tablet (200 mg total) by mouth daily. 31 tablet 6  . atorvastatin (LIPITOR) 20 MG tablet Take 1 tablet (20 mg total) by mouth daily at 6 PM. 31 tablet 6  . cholecalciferol (VITAMIN D) 1000 UNITS tablet Take 1,000 Units by mouth daily.     . Garlic 151 MG CAPS Take 300 mg by mouth daily.     . hyaluronate sodium (RADIAPLEXRX) GEL Apply 1 application topically once.    . metoprolol tartrate (LOPRESSOR) 25 MG tablet Take 0.5 tablets (12.5 mg total) by mouth 2 (two) times daily. 62 tablet 6  . Multiple Vitamins-Minerals (CENTRUM SILVER ADULT 50+ PO) Take 1 tablet by mouth daily.     No current facility-administered medications on file prior to visit.     REVIEW OF SYSTEMS: Please see history of present illness  PHYSICAL EXAMINATION:  Vital signs are  Filed Vitals:   07/23/14 1410 07/23/14 1411  BP: 159/64 156/68  Pulse: 88 92  Resp: 16   Height: 5' 8.5" (1.74 m)   Weight: 155 lb (70.308 kg)    Body mass index is 23.22 kg/(m^2). General: The patient appears their stated age. HEENT: No gross abnormalities Pulmonary: Non labored breathing Musculoskeletal: There are no major deformities. Neurologic: No focal weakness or paresthesias are detected, Skin: There are no ulcer or rashes noted. Psychiatric: The patient has normal affect.    Diagnostic Studies none  Assessment: juxtarenal abdominal aortic aneurysm Plan: The patient is scheduled to have a PET scan to evaluate for residual versus new disease. Once this has been completed, if it is negative, I will repeat his CT angiogram of the chest abdomen and pelvis. I will size his graft based off these images as it has beenmore than 4 months since his most recent CT scan. More than likely we will proceed with fenestrated aneurysm repair assuming he is cleared from a pulmonary perspective. I again went over the risks and benefits of  the operation. He is willing to proceed.  Eldridge Abrahams, M.D. [facility] Office: [phone] Pager: [phone]       Admitted yesterday for IV hydration given renal insufficiency.  Cr 1.7 today  [facility] Brabham

## 2014-10-19 NOTE — Op Note (Signed)
Patient name: Alfred Hall  MRN: [id number] DOB: [birth year] Sex: male  11/14/2014 - 11/01/2014 Pre-operative Diagnosis: Juxtarenal aortic aneurysm Post-operative diagnosis:  Same Surgeon:  Annamarie Major Assistants:  Pervis Hocking Procedure:   #1: Fenestrated endovascular repair of juxtarenal abdominal aortic aneurysm   #2: Stent, left renal artery   #3: Stent, right renal artery   #4: Selective left renal angiogram   #5: Selective right renal angiogram   #6: Abdominal angiogram   #7: Angioplasty, left external iliac artery   #8: Distal extension 1  Anesthesia:  Gen. Blood Loss:  See anesthesia record Specimens:  None  Findings:  The patient had heavy calcification within his iliac arteries bilaterally which made insertion of bilateral sheaths challenging.  He had a pre-existing left external iliac artery stent which had to be dilated in order to get a 56 French sheath into the aorta from the left.  It was very difficult to manipulate his graft inside the aorta because of the significant calcification.  He had excellent Doppler signals in bilateral lower extremities following the procedure.  Upon sheath removal, the patient became hypotensive.  I performed angiography while removing the sheaths to make sure that there was no arterial injury.  This was presumed to be washout from the sheaths given that they were likely occlusive because of the stenosis or narrowing within his iliac arteries and the duration of the case.  Devices used: Main body was a ZFEN-P 2-28-124.  Distal was a ZFEN 16-62-76.  Contralateral left limb was a ZSLE 13x74.  Distal ipsilateral extension was a ELSE 16x55  Indications:  This is an 81 year old gentleman who has a juxtarenal abdominal aortic aneurysm.  He was initially scheduled for repair but was diagnosed with lung cancer and has successfully completed treatment for T2 N0 lesion.  He is now ready for repair of his abdominal aortic aneurysm.  Because of  his comorbidities and recent treatment for lung cancer, I felt he was best treated with a fenestrated device.  The risks and benefits of the procedure were discussed with the patient which include but are not limited to renal failure, intestinal ischemia, lower extremity ischemia, death.  After a well-formed discussion, he wished to proceed  Procedure:  The patient was identified in the holding area and taken to [place] 16  The patient was then placed supine on the table. general anesthesia was administered.  The patient was prepped and draped in the usual sterile fashion.  A time out was called and antibiotics were administered.  Ultrasound was used to evaluate bilateral common femoral arteries.  They were calcified and diseased but did have an area of soft appearance anteriorly.  A #11 blade was used to make a skin nick.  Ultrasound was used to cannulate bilateral common femoral arteries with an 18-gauge needle.  An 035 wire was advanced without resistance.  The subcutaneous tract was dilated with an 8 [place] dilator.  Provide devices were deployed at the 11:00 and 1:00 position for pre-closure bilaterally.  8 French sheaths were placed bilaterally.  The patient was fully heparinized.  Heparin levels were adjusted based on ACT monitoring.  A Lunderquist wire was advanced up both sides.  I tried to place a 79 French sheath up the left groin but this would not advance past a stent that had previously been placed in the left external iliac artery.  I selected a 9 x 20 angioplasty balloon and perform balloon angioplasty within the stent  in the external iliac artery.  The balloon was taken to burst pressure.  It was then deflated.  The dilator was replaced into the sheath and the sheath again was attempted to be advanced without success.  I therefore elected to downsize the sheath size to a 16 French dry seal sheath.  This was able to be advanced into the aorta.  Using a BER catheter the left renal artery was  pre-cannulated with a 014 wire.  Next, the proximal piece was prepared on the back table and oriented outside of the body.  This was a ZFEN P 2-28-124.  It was then inserted through the right groin to the level of the renal arteries.  A contrast injection was performed confirming the location of the renal arteries.  The proximal 2 stents of the device were then deployed.  I had difficulty torquing and manipulating the graft because of the calcification within the aorta and iliac vessels.Marland Kitchen  Ultimately I was satisfied with the position of the stent and then it was fully deployed.  The distal end of the proximal piece was then cannulated from the left side and the 16 French sheath advanced within the device.  I placed a 7 [place] Ansel 1 sheath through the 16 [place] sheath.  Using a IM catheter, the right renal artery was selected after some difficulty.  I was able to advance a Glidewire out into the right renal artery.  I then tracked a quick cross catheter over the Glidewire, followed by removal of the Glidewire and replacement of a Rosen wire.  I removed the quick cross catheter, reinserted the dilator for the 7 French sheath and advanced the 7 French sheath out into the main right renal artery.  A contrast injection confirmed appropriate location.  Next a second access into the 16 French sheath was obtained and a 6 [place] sheath was advanced into the proximal piece.  Using a BER catheter and a Glidewire the left renal artery was selected.  I then advanced a quick cross catheter out the left renal artery so that a Rosen wire could be inserted.  The dilator was replaced in the sheath and the sheath was advanced out the left renal artery.  A contrast injection was performed confirming successful cannulation.  At this point the top cap was deployed, releasing the supra renal stent.  A Coda balloon was then used to mold the sealing stents.  A 7 x 22 ICAST stent was advanced out the left renal artery and a 6 x 22 ICAST  stent.  Both stents were fully deployed and then flared using a 9 x 20 balloon.  The distal main body was then prepared on the back table.  This was a ZFEN D 16-22-76 device.  This was advanced into the proximal piece, making sure the gate was properly oriented.  This was then deployed down to the contralateral gate.  From the left side the contralateral gate was cannulated using a reverse curve catheter and a Glidewire.  I confirmed successful cannulation with a Coda balloon.  A retrograde injection was performed through the left groin with a RAO oblique position to locate the left hypogastric artery.  I selected a ZSLE 13x74 device.  This was deployed down to the distal left common iliac artery.  Next, the remaining ipsilateral limb was deployed.  This landed short of the right hypogastric artery.  I was concerned that I could have ruptured the iliac artery on the way into the right and therefore  I placed a coda balloon in the main body of the device before removing the sheath.  A retrograde injection was performed this did not show any obvious evidence of rupture.  I replaced the main body device delivery system with a dry seal 20 French sheath.  This gave me a good evaluation of the right external iliac and hypogastric artery were I did not see any evidence of rupture.  I needed to extended distally on this side in order to get the appropriate amount of seal distance in the right common iliac artery..  I selected a ELSE 16 x 55 stent and deployed at the level of the hypogastric artery.  A Coda balloon was then used to mold the device overlap's as well as the limbs of the graft.  I elected not to shoot a completion Angiogram  because of the patient's renal issues.  I did shoot contrast injections through the renal stent to be sure they were deployed in the proper location.  This showed showed no evidence of a endoleak.   At this point the sheaths were withdrawn and the pro-glide devices were secured.  There was  some bleeding from the left groin which resolved with manual pressure.  50 mg of protamine was given.  The patient had a significant washout injury after the sheaths were withdrawn as there were likely occlusive in the external iliac arteries which are heavily calcified and the sheaths were very tight going through.  He did have Doppler signals in the bilateral dorsalis pedis pulses after the case.  The pro-glide devices were used to close the groin incisions followed by [place]ermabond.   because of the hypotension, and difficulty with sheath insertion, a on table CT scan was done with the  [facility] device to look for retroperitoneal hematoma.  None was apparent.     Disposition:  To PACU in stable condition.   Theotis Burrow, M.D. [facility] [place] Office: [phone] Pager:  [phone]

## 2014-10-19 NOTE — Progress Notes (Signed)
eLink Physician-Brief Progress Note Patient Name: [name] DOB: July 20, 1929 MRN: [id number]   Date of Service  11/05/2014  HPI/Events of Note  Call from [place] sbp 69 on neo  eICU Interventions  2L fluid bolus Start levophed Await midnight labs     Intervention Category Major Interventions: Hypovolemia - evaluation and treatment with fluids;Hypotension - evaluation and management;Shock - evaluation and management  Varick Keys 11/16/2014, 11:25 PM

## 2014-10-19 NOTE — Transfer of Care (Signed)
Immediate Anesthesia Transfer of Care Note  Patient: Alfred Hall  Procedure(s) Performed: Procedure(s): ABDOMINAL AORTIC ENDOVASCULAR FENESTRATED STENT GRAFT (N/A)  Patient Location: PACU  Anesthesia Type:General  Level of Consciousness: sedated, unresponsive and Patient remains intubated per anesthesia plan  Airway & Oxygen Therapy: Patient remains intubated per anesthesia plan and Patient placed on Ventilator (see vital sign flow sheet for setting)  Post-op Assessment: Report given to RN and Post -op Vital signs reviewed and stable  Post vital signs: Reviewed and stable  Last Vitals:  Filed Vitals:   11/14/2014 1553  BP:   Pulse: 58  Temp:   Resp: 22    Complications: No apparent anesthesia complications

## 2014-10-19 NOTE — Progress Notes (Signed)
64mq bicarb , 10units insulin, and amp D50 administered   Via IV by CRNA at bedside.

## 2014-10-19 NOTE — Progress Notes (Signed)
Vascular surgeons at bedside, per assessment Alfred Hall wishing to proceed with bedside fasciotomy in PACU.

## 2014-10-19 NOTE — Anesthesia Preprocedure Evaluation (Signed)
Anesthesia Evaluation  Patient identified by MRN, date of birth, ID band Patient awake    Reviewed: Allergy & Precautions, NPO status , Patient's Chart, lab work & pertinent test results  Airway Mallampati: I  TM Distance: >3 FB Neck ROM: Full    Dental   Pulmonary COPDformer smoker,    Pulmonary exam normal       Cardiovascular hypertension, Pt. on medications + CAD Normal cardiovascular exam    Neuro/Psych    GI/Hepatic   Endo/Other    Renal/GU CRFRenal disease     Musculoskeletal   Abdominal   Peds  Hematology   Anesthesia Other Findings   Reproductive/Obstetrics                             Anesthesia Physical Anesthesia Plan  ASA: III  Anesthesia Plan: General   Post-op Pain Management:    Induction: Intravenous  Airway Management Planned: Oral ETT  Additional Equipment:   Intra-op Plan:   Post-operative Plan: Extubation in OR  Informed Consent: I have reviewed the patients History and Physical, chart, labs and discussed the procedure including the risks, benefits and alternatives for the proposed anesthesia with the patient or authorized representative who has indicated his/her understanding and acceptance.     Plan Discussed with: CRNA and Surgeon  Anesthesia Plan Comments:         Anesthesia Quick Evaluation

## 2014-10-19 NOTE — Progress Notes (Signed)
CRITICAL VALUE ALERT  Critical value received:  Potassium 7.0  Date of notification:  10/19/14  Time of notification:  1620  Critical value read back:Yes.    Nurse who received alert:  Dina Rich  MD notified (1st page):  Brabham  Time of first page: 1620 verbal at bedside  MD notified (2nd page):  Time of second page:  Responding MD:  Trula Slade  Time MD responded:  [id number]  Ordered 10units insulin and amp D50

## 2014-10-19 NOTE — Progress Notes (Signed)
1 gram Calcium chloride administered by CRNA mike at bedside. Notified of blood gas results. ABG 7.307 bicarb 18.9. Additional amp of bicarb administered.

## 2014-10-19 NOTE — Progress Notes (Signed)
Neo drip titrated down to 38mg per CRNA at bedside.

## 2014-10-19 NOTE — Progress Notes (Signed)
Called to PACU to place pt on vent.  See flowsheet for vent settings.

## 2014-10-19 NOTE — Progress Notes (Signed)
eLink Physician-Brief Progress Note Patient Name: Alfred Hall DOB: 1930/02/14 MRN: [id number]   Date of Service  10/28/2014  HPI/Events of Note  Shock continues Hyperkalemia AKI Fellow noted IJ flat with CVL placment  eICU Interventions  Bolus saline now, 1 L Repeat BMET midnight kayexelate x1 Insulin, D50     Intervention Category Major Interventions: Shock - evaluation and management Intermediate Interventions: Oliguria - evaluation and management  MCQUAID, DOUGLAS 11/08/2014, 8:23 PM

## 2014-10-19 NOTE — Progress Notes (Signed)
Dr Meredith Pel completed left lower extremity fasciotomy with wound vac application. Patient stable remains on vent. Will transfer to 2s when bed available.

## 2014-10-19 NOTE — Procedures (Signed)
CENTRAL VENOUS CATHETER INSERTION   Indication: shock Consent: yes Time out: yes Appropriate position: yes Hand washing: yes Patient Sterilized and Draped: yes Location: Left IJ # of Attempts: 1 Ultrasound Guidance: yes Wire Confirmed with [place]: yes Insertion depth: 20 cm All Ports Draw and flush: yes CXR:   Pneumothorax: no  Line position appropriate: yes Line sutured in place: yes EBL: <5 cc Complications: no Patient Tolerated Procedure Well: yes     Alfred Mattes, DO., MS [facility]

## 2014-10-19 NOTE — Anesthesia Postprocedure Evaluation (Signed)
Anesthesia Post Note  Patient: Alfred Hall  Procedure(s) Performed: Procedure(s) (LRB): ABDOMINAL AORTIC ENDOVASCULAR FENESTRATED STENT GRAFT (N/A)  Anesthesia type: General  Patient location: ICU  Post pain: Pain level controlled  Post assessment: Post-op Vital signs reviewed  Last Vitals:  Filed Vitals:   11/09/2014 1637  BP:   Pulse: 59  Temp: 36.3 C  Resp:     Post vital signs: stable  Level of consciousness: Patient remains intubated per anesthesia plan  Complications: No apparent anesthesia complications

## 2014-10-19 NOTE — H&P (Signed)
History and Physical  Patient name: Alfred Hall MRN: [id number] DOB: Jan 15, 1930 Sex: male   Reason for Admission: No chief complaint on file.   HISTORY OF PRESENT ILLNESS: Admitted for IV hydration in anticipation of FEVAR tomorrow given renal insufficiency  Past Medical History  Diagnosis Date  . PVD (peripheral vascular disease)   . Microhematuria     a. workup negative  . Aortic aneurysm, abdominal   . Hypercholesteremia   . Hypertension   . Thoracic aortic aneurysm without rupture   . Aortic stenosis     a. ECHO at [place]- pk AV grad 77 with a mean rate of 53   . Tobacco abuse   . CAD (coronary artery disease)     a. s/p LHC on 09/18/13 with non-obs disease and severe AS  . COPD (chronic obstructive pulmonary disease)   . Hypoglycemia   . Abdominal aortic aneurysm   . Wears glasses   . Lung cancer   . Radiation     treatment for lung  . History of blood transfusion     "don't remember why"  . Gout     "have had it in my feet & in my right hand"    Past Surgical History  Procedure Laterality Date  . Cataract extraction w/ intraocular lens implant Right   . Throat nodule excision    . Nasal hemorrhage control    . Cardiac catheterization    . Tonsillectomy    . Appendectomy    . Ascending aortic root replacement N/A 12/26/2013    Procedure: ASCENDING AORTIC  REPLACEMENT;  Surgeon: Grace Isaac, MD;  Location: [place];  Service: Open Heart Surgery;  Laterality: N/A;  . Intraoperative transesophageal echocardiogram N/A 12/26/2013    Procedure: INTRAOPERATIVE TRANSESOPHAGEAL ECHOCARDIOGRAM;  Surgeon: Grace Isaac, MD;  Location: [place];  Service: Open Heart Surgery;  Laterality: N/A;  . Lung biopsy Left 12/26/2013    Procedure: LUNG BIOPSY;  Surgeon: Grace Isaac, MD;  Location: [place];  Service: Open Heart Surgery;  Laterality: Left;  . Coronary artery bypass graft N/A 12/26/2013    Procedure: CORONARY ARTERY BYPASS GRAFTING (CABG) times one  using the left internal mammary artery.;  Surgeon: Grace Isaac, MD;  Location: [place];  Service: Open Heart Surgery;  Laterality: N/A;  . Aortic valve replacement  12/26/2013    Procedure: AORTIC VALVE REPLACEMENT (AVR);  Surgeon: Grace Isaac, MD;  Location: [place];  Service: Open Heart Surgery;;  . Left and right heart catheterization with coronary angiogram N/A 09/18/2013    Procedure: LEFT AND RIGHT HEART CATHETERIZATION WITH CORONARY ANGIOGRAM;  Surgeon: Blane Ohara, MD;  Location: [facility] CATH LAB;  Service: Cardiovascular;  Laterality: N/A;  . Cataract extraction Left     "have a hard contact in that eye"  . Cardiac valve replacement      Social History   Social History  . Marital Status: Married    Spouse Name: N/A  . Number of Children: 3  . Years of Education: N/A   Occupational History  . retired from [employer]  . Smoking status: Former Smoker -- 0.50 packs/day for 60 years    Types: Cigarettes    Quit date: 12/22/2013  . Smokeless tobacco: Never Used  . Alcohol Use: 0.0 oz/week    0 Standard drinks or equivalent per week     Comment: 11/14/2014 "3-4 beers  and a small glass of scotch/yr"  . Drug Use: No  . Sexual Activity: Not Currently   Other Topics Concern  . Not on file   Social History Narrative    Family History  Problem Relation Age of Onset  . Renal Disease Father   . Congestive Heart Failure Mother     Allergies as of 10/04/2014  . (No Known Allergies)    No current facility-administered medications on file prior to encounter.   Current Outpatient Prescriptions on File Prior to Encounter  Medication Sig Dispense Refill  . aspirin EC 81 MG tablet Take 1 tablet (81 mg total) by mouth daily. 30 tablet   . atorvastatin (LIPITOR) 20 MG tablet Take 1 tablet (20 mg total) by mouth daily at 6 PM. 31 tablet 6  . cholecalciferol (VITAMIN D) 1000 UNITS tablet Take 1,000 Units by mouth daily.     . Garlic 401 MG  CAPS Take 300 mg by mouth daily.     . metoprolol tartrate (LOPRESSOR) 25 MG tablet Take 0.5 tablets (12.5 mg total) by mouth 2 (two) times daily. 62 tablet 6  . Multiple Vitamins-Minerals (CENTRUM SILVER ADULT 50+ PO) Take 1 tablet by mouth daily.       REVIEW OF SYSTEMS: Cardiovascular: No chest pain, chest pressure, palpitations, orthopnea, or dyspnea on exertion. No claudication or rest pain,  No history of DVT or phlebitis. Pulmonary: No productive cough, asthma or wheezing. Neurologic: No weakness, paresthesias, aphasia, or amaurosis. No dizziness. Hematologic: No bleeding problems or clotting disorders. Musculoskeletal: No joint pain or joint swelling. Gastrointestinal: No blood in stool or hematemesis Genitourinary: No dysuria or hematuria. Psychiatric:: No history of major depression. Integumentary: No rashes or ulcers. Constitutional: No fever or chills.  PHYSICAL EXAMINATION: General: The patient appears their stated age.  Vital signs are BP 135/62 mmHg  Pulse 62  Temp(Src) 98 F (36.7 C) (Oral)  Resp 18  Ht '5\' 8"'$  (1.727 m)  Wt 158 lb (71.668 kg)  BMI 24.03 kg/m2  SpO2 99% HEENT:  No gross abnormalities Pulmonary: Respirations are non-labored Abdomen: Soft and non-tender with. Musculoskeletal: There are no major deformities.   Neurologic: No focal weakness or paresthesias are detected, Skin: There are no ulcer or rashes noted. Psychiatric: The patient has normal affect. Cardiovascular: There is a regular rate and rhythm without significant murmur appreciated.   Assessment:  AAA Plan: IV hydration ocernight FEVAR tomorrow     V. Leia Alf, M.D. [facility] [place] Office: [phone] Pager:  [phone]

## 2014-10-19 NOTE — Anesthesia Procedure Notes (Signed)
Procedure Name: Intubation Date/Time: 11/08/2014 7:54 AM Performed by: Rebekah Chesterfield L Pre-anesthesia Checklist: Patient identified, Emergency Drugs available, Suction available, Patient being monitored and Timeout performed Patient Re-evaluated:Patient Re-evaluated prior to inductionOxygen Delivery Method: Circle system utilized Preoxygenation: Pre-oxygenation with 100% oxygen Intubation Type: IV induction and Cricoid Pressure applied Ventilation: Mask ventilation without difficulty Laryngoscope Size: Mac and 3 Grade View: Grade II Tube type: Oral Tube size: 8.0 mm Number of attempts: 1 Airway Equipment and Method: Stylet Placement Confirmation: ETT inserted through vocal cords under direct vision,  positive ETCO2 and breath sounds checked- equal and bilateral Secured at: 21 cm Tube secured with: Tape Dental Injury: Teeth and Oropharynx as per pre-operative assessment

## 2014-10-19 NOTE — Op Note (Signed)
    Patient name: Alfred Hall MRN: [id number] DOB: August 12, 1929 Sex: male  10/22/2014 - 11/08/2014 Pre-operative Diagnosis: Compartment syndrome, left leg Post-operative diagnosis:  Same Surgeon:  Annamarie Major Assistants:  [name] Procedure:   4 compartment fasciotomy Anesthesia:  Gen. Blood Loss:  See anesthesia record Specimens:  None  Findings:  Bulging, viable muscle especially in the anterior compartment  Indications:  The patient underwent a complex fenestrated endovascular aneurysm repair today.  Because of how tight his external iliac arteries were and the duration of the procedure he likely developed ischemia and a reperfusion injury.  His left calf was significantly more tight than the right.  I felt that he needed to have a fasciotomy done.  I discussed this with the family over the telephone.  This was done emergently.  Procedure:  The patient was identified in the PACU.  The patient was then placed supine on the table. general anesthesia was administered.  The patient was prepped and draped in the usual sterile fashion.  A time out was called and antibiotics were administered.  A medial and lateral incisions were made below the knee.  I initially addressed the lateral incision.  A longitudinal incision was made in the anterior compartment.  There was some bulging of the muscle after opening the compartment.  The muscle was viable.  I then took Metzenbaum scissors to open up the compartment throughout the lower leg.  I then identified the lateral compartment and opened this with Bovie cautery.  There was less bulging here in the muscle was viable.  The fascial incision was then extended down to the ankle and up to the knee.  All muscle appeared viable.  Next a medial incision was made in the calf.  The fascia was identified and then opened with Metzenbaum scissors.  The fascia was opened with Metzenbaum scissors from the ankle to the knee.  All possible here appeared viable.  Once  hemostasis was satisfactory a wound VAC was placed on the medial and lateral incisions.  Upon completion of the fasciotomy, there was a better appearance in the left foot.-   Disposition:  To PACU in stable condition.   Theotis Burrow, M.D. [facility] [place] Office: [phone] Pager:  [phone]

## 2014-10-19 NOTE — Progress Notes (Signed)
[facility] Progress Note Patient Name: Alfred Hall DOB: 1929-03-25 MRN: [id number]   Date of Service  10/26/2014  HPI/Events of Note   patient has just arrived in the intensive care unit after a fasciotomy. He had an endovascular repair of a AAA earlier today. He is currently mechanically ventilated and sedated.   eICU Interventions   mechanical ventilator orders written Neo-Synephrine orders written Sedation orders written On ground M.D. to evaluate      Intervention Category Major Interventions: Respiratory failure - evaluation and management;Shock - evaluation and management  Cleotilde Spadaccini 11/15/2014, 5:26 PM

## 2014-10-20 ENCOUNTER — Inpatient Hospital Stay (HOSPITAL_COMMUNITY): Payer: Medicare Other

## 2014-10-20 ENCOUNTER — Inpatient Hospital Stay (HOSPITAL_COMMUNITY): Payer: Medicare Other | Admitting: Anesthesiology

## 2014-10-20 ENCOUNTER — Encounter (HOSPITAL_COMMUNITY): Admission: RE | Disposition: E | Payer: Self-pay | Source: Ambulatory Visit | Attending: Surgery

## 2014-10-20 DIAGNOSIS — N179 Acute kidney failure, unspecified: Secondary | ICD-10-CM

## 2014-10-20 DIAGNOSIS — M79A22 Nontraumatic compartment syndrome of left lower extremity: Secondary | ICD-10-CM

## 2014-10-20 DIAGNOSIS — M79A21 Nontraumatic compartment syndrome of right lower extremity: Secondary | ICD-10-CM

## 2014-10-20 DIAGNOSIS — N185 Chronic kidney disease, stage 5: Secondary | ICD-10-CM

## 2014-10-20 HISTORY — PX: FASCIOTOMY: SHX132

## 2014-10-20 HISTORY — PX: INSERTION OF DIALYSIS CATHETER: SHX1324

## 2014-10-20 LAB — GLUCOSE, CAPILLARY
Glucose-Capillary: 307 mg/dL — ABNORMAL HIGH (ref 65–99)
Glucose-Capillary: 339 mg/dL — ABNORMAL HIGH (ref 65–99)
Glucose-Capillary: 218 mg/dL — ABNORMAL HIGH (ref 65–99)
Glucose-Capillary: 164 mg/dL — ABNORMAL HIGH (ref 65–99)

## 2014-10-20 LAB — POCT I-STAT 7, (LYTES, BLD GAS, ICA,H+H)
ACID-BASE DEFICIT: 17 mmol/L — AB (ref 0.0–2.0)
Acid-base deficit: 13 mmol/L — ABNORMAL HIGH (ref 0.0–2.0)
Acid-base deficit: 15 mmol/L — ABNORMAL HIGH (ref 0.0–2.0)
BICARBONATE: 14.3 meq/L — AB (ref 20.0–24.0)
BICARBONATE: 12.3 meq/L — AB (ref 20.0–24.0)
Bicarbonate: 13.3 mEq/L — ABNORMAL LOW (ref 20.0–24.0)
CALCIUM ION: 0.77 mmol/L — AB (ref 1.13–1.30)
Calcium, Ion: 0.61 mmol/L — CL (ref 1.13–1.30)
Calcium, Ion: 0.78 mmol/L — ABNORMAL LOW (ref 1.13–1.30)
HCT: 26 % — ABNORMAL LOW (ref 39.0–52.0)
HEMATOCRIT: 25 % — AB (ref 39.0–52.0)
HEMATOCRIT: 24 % — AB (ref 39.0–52.0)
HEMOGLOBIN: 8.5 g/dL — AB (ref 13.0–17.0)
HEMOGLOBIN: 8.2 g/dL — AB (ref 13.0–17.0)
HEMOGLOBIN: 8.8 g/dL — AB (ref 13.0–17.0)
O2 SAT: 100 %
O2 SAT: 100 %
O2 Saturation: 100 %
PCO2 ART: 40.7 mmHg (ref 35.0–45.0)
PH ART: 7.161 — AB (ref 7.350–7.450)
PH ART: 7.078 — AB (ref 7.350–7.450)
PO2 ART: 329 mmHg — AB (ref 80.0–100.0)
POTASSIUM: 6.1 mmol/L — AB (ref 3.5–5.1)
Potassium: 5.5 mmol/L — ABNORMAL HIGH (ref 3.5–5.1)
Potassium: 6.3 mmol/L (ref 3.5–5.1)
SODIUM: 136 mmol/L (ref 135–145)
Sodium: 134 mmol/L — ABNORMAL LOW (ref 135–145)
Sodium: 136 mmol/L (ref 135–145)
TCO2: 15 mmol/L (ref 0–100)
TCO2: 14 mmol/L (ref 0–100)
TCO2: 15 mmol/L (ref 0–100)
pCO2 arterial: 39.9 mmHg (ref 35.0–45.0)
pCO2 arterial: 41.9 mmHg (ref 35.0–45.0)
pH, Arterial: 7.123 — CL (ref 7.350–7.450)
pO2, Arterial: 329 mmHg — ABNORMAL HIGH (ref 80.0–100.0)
pO2, Arterial: 302 mmHg — ABNORMAL HIGH (ref 80.0–100.0)

## 2014-10-20 LAB — BLOOD GAS, ARTERIAL
ACID-BASE DEFICIT: 18.5 mmol/L — AB (ref 0.0–2.0)
ACID-BASE DEFICIT: 16 mmol/L — AB (ref 0.0–2.0)
BICARBONATE: 12.1 meq/L — AB (ref 20.0–24.0)
Bicarbonate: 10.1 mEq/L — ABNORMAL LOW (ref 20.0–24.0)
DRAWN BY: 23604
DRAWN BY: 252031
FIO2: 0.5
FIO2: 0.5
MECHVT: 550 mL
MECHVT: 550 mL
O2 Saturation: 94.6 %
O2 Saturation: 96.7 %
PEEP/CPAP: 5 cmH2O
PEEP/CPAP: 5 cmH2O
Patient temperature: 97.4
Patient temperature: 98.6
RATE: 22 resp/min
RATE: 22 resp/min
TCO2: 11.3 mmol/L (ref 0–100)
TCO2: 13.5 mmol/L (ref 0–100)
pCO2 arterial: 38.6 mmHg (ref 35.0–45.0)
pCO2 arterial: 43.2 mmHg (ref 35.0–45.0)
pH, Arterial: 7.042 — CL (ref 7.350–7.450)
pH, Arterial: 7.077 — CL (ref 7.350–7.450)
pO2, Arterial: 105 mmHg — ABNORMAL HIGH (ref 80.0–100.0)
pO2, Arterial: 131 mmHg — ABNORMAL HIGH (ref 80.0–100.0)

## 2014-10-20 LAB — CBC
HCT: 40.2 % (ref 39.0–52.0)
HEMATOCRIT: 33.9 % — AB (ref 39.0–52.0)
HEMATOCRIT: 37.9 % — AB (ref 39.0–52.0)
HEMOGLOBIN: 11.6 g/dL — AB (ref 13.0–17.0)
HEMOGLOBIN: 12.9 g/dL — AB (ref 13.0–17.0)
Hemoglobin: 10.6 g/dL — ABNORMAL LOW (ref 13.0–17.0)
MCH: 25.1 pg — ABNORMAL LOW (ref 26.0–34.0)
MCH: 26.4 pg (ref 26.0–34.0)
MCH: 28 pg (ref 26.0–34.0)
MCHC: 30.6 g/dL (ref 30.0–36.0)
MCHC: 31.3 g/dL (ref 30.0–36.0)
MCHC: 32.1 g/dL (ref 30.0–36.0)
MCV: 82 fL (ref 78.0–100.0)
MCV: 84.5 fL (ref 78.0–100.0)
MCV: 87.2 fL (ref 78.0–100.0)
PLATELETS: 90 10*3/uL — AB (ref 150–400)
Platelets: 109 10*3/uL — ABNORMAL LOW (ref 150–400)
Platelets: 32 10*3/uL — ABNORMAL LOW (ref 150–400)
RBC: 4.01 MIL/uL — ABNORMAL LOW (ref 4.22–5.81)
RBC: 4.61 MIL/uL (ref 4.22–5.81)
RBC: 4.62 MIL/uL (ref 4.22–5.81)
RDW: 18.2 % — ABNORMAL HIGH (ref 11.5–15.5)
RDW: 19.8 % — ABNORMAL HIGH (ref 11.5–15.5)
RDW: 20 % — AB (ref 11.5–15.5)
WBC: 12.3 10*3/uL — ABNORMAL HIGH (ref 4.0–10.5)
WBC: 23 10*3/uL — AB (ref 4.0–10.5)
WBC: 24.6 10*3/uL — AB (ref 4.0–10.5)

## 2014-10-20 LAB — POCT I-STAT 3, ART BLOOD GAS (G3+)
ACID-BASE DEFICIT: 15 mmol/L — AB (ref 0.0–2.0)
BICARBONATE: 13.7 meq/L — AB (ref 20.0–24.0)
O2 SAT: 100 %
PH ART: 7.109 — AB (ref 7.350–7.450)
PO2 ART: 289 mmHg — AB (ref 80.0–100.0)
Patient temperature: 96
TCO2: 15 mmol/L (ref 0–100)
pCO2 arterial: 42.3 mmHg (ref 35.0–45.0)

## 2014-10-20 LAB — MAGNESIUM: Magnesium: 2.3 mg/dL (ref 1.7–2.4)

## 2014-10-20 LAB — HEPATIC FUNCTION PANEL
ALBUMIN: 2 g/dL — AB (ref 3.5–5.0)
ALT: 7430 U/L — AB (ref 17–63)
Alkaline Phosphatase: 125 U/L (ref 38–126)
BILIRUBIN INDIRECT: 0.6 mg/dL (ref 0.3–0.9)
Bilirubin, Direct: 0.6 mg/dL — ABNORMAL HIGH (ref 0.1–0.5)
TOTAL PROTEIN: 4 g/dL — AB (ref 6.5–8.1)
Total Bilirubin: 1.2 mg/dL (ref 0.3–1.2)

## 2014-10-20 LAB — RENAL FUNCTION PANEL
ANION GAP: 22 — AB (ref 5–15)
Albumin: 2.5 g/dL — ABNORMAL LOW (ref 3.5–5.0)
BUN: 17 mg/dL (ref 6–20)
CALCIUM: 6.5 mg/dL — AB (ref 8.9–10.3)
CO2: 13 mmol/L — AB (ref 22–32)
CREATININE: 2.36 mg/dL — AB (ref 0.61–1.24)
Chloride: 105 mmol/L (ref 101–111)
GFR, EST AFRICAN AMERICAN: 28 mL/min — AB (ref >60)
GFR, EST NON AFRICAN AMERICAN: 24 mL/min — AB (ref >60)
Glucose, Bld: 231 mg/dL — ABNORMAL HIGH (ref 65–99)
PHOSPHORUS: 6.9 mg/dL — AB (ref 2.5–4.6)
Potassium: 5.4 mmol/L — ABNORMAL HIGH (ref 3.5–5.1)
SODIUM: 140 mmol/L (ref 135–145)

## 2014-10-20 LAB — PREPARE RBC (CROSSMATCH)

## 2014-10-20 LAB — BASIC METABOLIC PANEL
ANION GAP: 24 — AB (ref 5–15)
Anion gap: 21 — ABNORMAL HIGH (ref 5–15)
BUN: 26 mg/dL — ABNORMAL HIGH (ref 6–20)
BUN: 27 mg/dL — ABNORMAL HIGH (ref 6–20)
CALCIUM: 6.5 mg/dL — AB (ref 8.9–10.3)
CHLORIDE: 103 mmol/L (ref 101–111)
CO2: 13 mmol/L — AB (ref 22–32)
CO2: 15 mmol/L — ABNORMAL LOW (ref 22–32)
CREATININE: 3.15 mg/dL — AB (ref 0.61–1.24)
Calcium: 7.1 mg/dL — ABNORMAL LOW (ref 8.9–10.3)
Chloride: 103 mmol/L (ref 101–111)
Creatinine, Ser: 2.89 mg/dL — ABNORMAL HIGH (ref 0.61–1.24)
GFR calc non Af Amer: 19 mL/min — ABNORMAL LOW (ref >60)
GFR, EST AFRICAN AMERICAN: 20 mL/min — AB (ref >60)
GFR, EST AFRICAN AMERICAN: 22 mL/min — AB (ref >60)
GFR, EST NON AFRICAN AMERICAN: 17 mL/min — AB (ref >60)
Glucose, Bld: 304 mg/dL — ABNORMAL HIGH (ref 65–99)
Glucose, Bld: 325 mg/dL — ABNORMAL HIGH (ref 65–99)
POTASSIUM: 5.2 mmol/L — AB (ref 3.5–5.1)
Potassium: 5.4 mmol/L — ABNORMAL HIGH (ref 3.5–5.1)
SODIUM: 139 mmol/L (ref 135–145)
Sodium: 140 mmol/L (ref 135–145)

## 2014-10-20 LAB — APTT: aPTT: 109 seconds — ABNORMAL HIGH (ref 24–37)

## 2014-10-20 LAB — PHOSPHORUS: PHOSPHORUS: 9.3 mg/dL — AB (ref 2.5–4.6)

## 2014-10-20 LAB — PROTIME-INR
INR: 4.92 — ABNORMAL HIGH (ref 0.00–1.49)
INR: 6.92 (ref 0.00–1.49)
Prothrombin Time: 44.4 seconds — ABNORMAL HIGH (ref 11.6–15.2)
Prothrombin Time: 57.4 seconds — ABNORMAL HIGH (ref 11.6–15.2)

## 2014-10-20 LAB — CK TOTAL AND CKMB (NOT AT ARMC)
CK, MB: >260 ng/mL — ABNORMAL HIGH (ref 0.5–5.0)
Total CK: 37516 U/L — ABNORMAL HIGH (ref 49–397)

## 2014-10-20 LAB — LACTIC ACID, PLASMA: LACTIC ACID, VENOUS: 17.1 mmol/L — AB (ref 0.5–2.0)

## 2014-10-20 LAB — TROPONIN I: TROPONIN I: 0.2 ng/mL — AB (ref <0.031)

## 2014-10-20 SURGERY — FASCIOTOMY, UPPER EXTREMITY
Anesthesia: General | Site: Neck | Laterality: Right

## 2014-10-20 MED ORDER — HEPARIN SODIUM (PORCINE) 1000 UNIT/ML DIALYSIS
1000.0000 [IU] | INTRAMUSCULAR | Status: DC | PRN
  Filled 2014-10-20: qty 6

## 2014-10-20 MED ORDER — SODIUM CHLORIDE 0.9 % IV SOLN
1.0000 g | Freq: Once | INTRAVENOUS | Status: AC
  Administered 2014-10-20: 1 g via INTRAVENOUS
  Filled 2014-10-20: qty 10

## 2014-10-20 MED ORDER — PRISMASOL BGK 4/2.5 32-4-2.5 MEQ/L IV SOLN
INTRAVENOUS | Status: DC
  Administered 2014-10-20 – 2014-10-21 (×4): via INTRAVENOUS_CENTRAL
  Filled 2014-10-20 (×9): qty 5000

## 2014-10-20 MED ORDER — ALBUMIN HUMAN 5 % IV SOLN
INTRAVENOUS | Status: DC | PRN
  Administered 2014-10-20: 12:00:00 via INTRAVENOUS

## 2014-10-20 MED ORDER — ALTEPLASE 2 MG IJ SOLR
2.0000 mg | Freq: Once | INTRAMUSCULAR | Status: AC
  Administered 2014-10-20: 2 mg
  Filled 2014-10-20: qty 2

## 2014-10-20 MED ORDER — EPHEDRINE SULFATE 50 MG/ML IJ SOLN
INTRAMUSCULAR | Status: AC
  Filled 2014-10-20: qty 1

## 2014-10-20 MED ORDER — VITAMIN K1 10 MG/ML IJ SOLN
10.0000 mg | Freq: Once | INTRAMUSCULAR | Status: AC
  Administered 2014-10-20: 10 mg via SUBCUTANEOUS
  Filled 2014-10-20: qty 1

## 2014-10-20 MED ORDER — 0.9 % SODIUM CHLORIDE (POUR BTL) OPTIME
TOPICAL | Status: DC | PRN
  Administered 2014-10-20: 1000 mL

## 2014-10-20 MED ORDER — ROCURONIUM BROMIDE 50 MG/5ML IV SOLN
INTRAVENOUS | Status: AC
  Filled 2014-10-20: qty 1

## 2014-10-20 MED ORDER — HEPARIN SODIUM (PORCINE) 1000 UNIT/ML IJ SOLN
INTRAMUSCULAR | Status: AC
  Filled 2014-10-20: qty 1

## 2014-10-20 MED ORDER — ONDANSETRON HCL 4 MG/2ML IJ SOLN
INTRAMUSCULAR | Status: AC
  Filled 2014-10-20: qty 2

## 2014-10-20 MED ORDER — GLYCOPYRROLATE 0.2 MG/ML IJ SOLN
INTRAMUSCULAR | Status: AC
  Filled 2014-10-20: qty 3

## 2014-10-20 MED ORDER — SODIUM BICARBONATE 8.4 % IV SOLN
INTRAVENOUS | Status: DC
  Administered 2014-10-20 (×2): via INTRAVENOUS
  Filled 2014-10-20 (×6): qty 150

## 2014-10-20 MED ORDER — PHENYLEPHRINE 40 MCG/ML (10ML) SYRINGE FOR IV PUSH (FOR BLOOD PRESSURE SUPPORT)
PREFILLED_SYRINGE | INTRAVENOUS | Status: AC
  Filled 2014-10-20: qty 10

## 2014-10-20 MED ORDER — PIPERACILLIN-TAZOBACTAM 3.375 G IVPB 30 MIN
3.3750 g | Freq: Four times a day (QID) | INTRAVENOUS | Status: DC
Start: 2014-10-20 — End: 2014-10-21
  Administered 2014-10-20 – 2014-10-21 (×5): 3.375 g via INTRAVENOUS
  Filled 2014-10-20 (×8): qty 50

## 2014-10-20 MED ORDER — HEPARIN SODIUM (PORCINE) 1000 UNIT/ML IJ SOLN: INTRAMUSCULAR | Status: DC | PRN

## 2014-10-20 MED ORDER — INSULIN ASPART 100 UNIT/ML ~~LOC~~ SOLN
0.0000 [IU] | SUBCUTANEOUS | Status: DC
  Administered 2014-10-20: 2 [IU] via SUBCUTANEOUS
  Administered 2014-10-21: 1 [IU] via SUBCUTANEOUS

## 2014-10-20 MED ORDER — SODIUM CHLORIDE 0.9 % IV SOLN: Freq: Once | INTRAVENOUS | Status: DC

## 2014-10-20 MED ORDER — SODIUM CHLORIDE 0.9 % IV SOLN
0.0300 [IU]/min | INTRAVENOUS | Status: DC
  Administered 2014-10-20 – 2014-10-21 (×2): 0.03 [IU]/min via INTRAVENOUS
  Filled 2014-10-20 (×2): qty 2

## 2014-10-20 MED ORDER — SODIUM CHLORIDE 0.9 % IV SOLN: 10.0000 mL/h | Freq: Once | INTRAVENOUS | Status: DC

## 2014-10-20 MED ORDER — SODIUM CHLORIDE 0.9 % IJ SOLN
INTRAMUSCULAR | Status: AC
  Filled 2014-10-20: qty 10

## 2014-10-20 MED ORDER — MIDAZOLAM HCL 2 MG/2ML IJ SOLN
INTRAMUSCULAR | Status: AC
  Filled 2014-10-20: qty 4

## 2014-10-20 MED ORDER — MIDAZOLAM HCL 2 MG/2ML IJ SOLN
INTRAMUSCULAR | Status: DC | PRN
  Administered 2014-10-20 (×2): 2 mg via INTRAVENOUS

## 2014-10-20 MED ORDER — FAMOTIDINE IN NACL 20-0.9 MG/50ML-% IV SOLN
20.0000 mg | INTRAVENOUS | Status: DC
  Administered 2014-10-21: 20 mg via INTRAVENOUS
  Filled 2014-10-20: qty 50

## 2014-10-20 MED ORDER — PRISMASOL BGK 4/2.5 32-4-2.5 MEQ/L IV SOLN
INTRAVENOUS | Status: DC
  Administered 2014-10-20 – 2014-10-21 (×8): via INTRAVENOUS_CENTRAL
  Filled 2014-10-20 (×19): qty 5000

## 2014-10-20 MED ORDER — FENTANYL CITRATE (PF) 250 MCG/5ML IJ SOLN
INTRAMUSCULAR | Status: AC
  Filled 2014-10-20: qty 5

## 2014-10-20 MED ORDER — PRISMASOL BGK 4/2.5 32-4-2.5 MEQ/L IV SOLN
INTRAVENOUS | Status: DC
  Administered 2014-10-20 – 2014-10-21 (×4): via INTRAVENOUS_CENTRAL
  Filled 2014-10-20 (×17): qty 5000

## 2014-10-20 MED ORDER — SUCCINYLCHOLINE CHLORIDE 20 MG/ML IJ SOLN
INTRAMUSCULAR | Status: AC
  Filled 2014-10-20: qty 1

## 2014-10-20 MED ORDER — SODIUM CHLORIDE 0.9 % IV SOLN
INTRAVENOUS | Status: DC | PRN
  Administered 2014-10-20: 500 mL

## 2014-10-20 MED ORDER — SODIUM BICARBONATE 8.4 % IV SOLN
INTRAVENOUS | Status: AC
  Administered 2014-10-20: 50 meq
  Filled 2014-10-20: qty 50

## 2014-10-20 MED ORDER — SODIUM BICARBONATE 8.4 % IV SOLN
100.0000 meq | Freq: Once | INTRAVENOUS | Status: AC
  Administered 2014-10-20: 100 meq via INTRAVENOUS
  Filled 2014-10-20: qty 100

## 2014-10-20 MED ORDER — SODIUM CHLORIDE 0.9 % IV SOLN
Freq: Once | INTRAVENOUS | Status: AC
  Administered 2014-10-20: 20:00:00 via INTRAVENOUS

## 2014-10-20 MED ORDER — NEOSTIGMINE METHYLSULFATE 10 MG/10ML IV SOLN
INTRAVENOUS | Status: AC
  Filled 2014-10-20: qty 1

## 2014-10-20 MED ORDER — SODIUM CHLORIDE 0.9 % IV BOLUS (SEPSIS)
1000.0000 mL | Freq: Once | INTRAVENOUS | Status: AC
  Administered 2014-10-20: 1000 mL via INTRAVENOUS

## 2014-10-20 MED ORDER — LIDOCAINE HCL (CARDIAC) 20 MG/ML IV SOLN
INTRAVENOUS | Status: AC
  Filled 2014-10-20: qty 5

## 2014-10-20 MED ORDER — SODIUM CHLORIDE 0.9 % FOR CRRT
INTRAVENOUS_CENTRAL | Status: DC | PRN
  Filled 2014-10-20: qty 1000

## 2014-10-20 MED ORDER — SODIUM CHLORIDE 0.9 % IV SOLN
1250.0000 mg | INTRAVENOUS | Status: AC
Start: 2014-10-20 — End: 2014-10-20
  Administered 2014-10-20: 1250 mg via INTRAVENOUS
  Filled 2014-10-20: qty 1250

## 2014-10-20 MED ORDER — VANCOMYCIN HCL IN DEXTROSE 750-5 MG/150ML-% IV SOLN
750.0000 mg | INTRAVENOUS | Status: DC
  Filled 2014-10-20: qty 150

## 2014-10-20 MED ORDER — CALCIUM CHLORIDE 10 % IV SOLN
INTRAVENOUS | Status: DC | PRN
  Administered 2014-10-20: 1 g via INTRAVENOUS

## 2014-10-20 MED ORDER — SODIUM BICARBONATE 8.4 % IV SOLN
INTRAVENOUS | Status: DC | PRN
  Administered 2014-10-20: 50 meq via INTRAVENOUS

## 2014-10-20 MED ORDER — ARTIFICIAL TEARS OP OINT
TOPICAL_OINTMENT | OPHTHALMIC | Status: AC
Start: 2014-10-20 — End: 2014-10-20
  Filled 2014-10-20: qty 3.5

## 2014-10-20 MED ORDER — SODIUM CHLORIDE 0.9 % IJ SOLN
INTRAMUSCULAR | Status: DC | PRN
  Administered 2014-10-20: 4.6 mL via INTRAVENOUS

## 2014-10-20 SURGICAL SUPPLY — 50 items
BAG ISOLATION DRAPE 18X18 (DRAPES) ×4 IMPLANT
BANDAGE ELASTIC 4 VELCRO ST LF (GAUZE/BANDAGES/DRESSINGS) IMPLANT
BANDAGE ELASTIC 6 VELCRO ST LF (GAUZE/BANDAGES/DRESSINGS) IMPLANT
BIOPATCH BLUE 3/4IN DISK W/1.5 (GAUZE/BANDAGES/DRESSINGS) ×4 IMPLANT
BNDG GAUZE ELAST 4 BULKY (GAUZE/BANDAGES/DRESSINGS) IMPLANT
CANISTER SUCTION 2500CC (MISCELLANEOUS) ×4 IMPLANT
CANISTER WOUND CARE 500ML ATS (WOUND CARE) ×8 IMPLANT
CLIP TI MEDIUM 6 (CLIP) ×4 IMPLANT
CLIP TI WIDE RED SMALL 24 (CLIP) ×4 IMPLANT
CLIP TI WIDE RED SMALL 6 (CLIP) ×8 IMPLANT
CONNECTOR Y ATS VAC SYSTEM (MISCELLANEOUS) ×8 IMPLANT
DRAPE BILATERAL SPLIT (DRAPES) ×4 IMPLANT
DRAPE INCISE IOBAN 66X45 STRL (DRAPES) IMPLANT
DRAPE ISOLATION BAG 18X18 (DRAPES) ×4
DRAPE ORTHO SPLIT 77X108 STRL (DRAPES) ×4
DRAPE PROXIMA HALF (DRAPES) ×4 IMPLANT
DRAPE SURG ORHT 6 SPLT 77X108 (DRAPES) ×4 IMPLANT
DRSG MEPITEL 4X7.2 (GAUZE/BANDAGES/DRESSINGS) ×12 IMPLANT
DRSG OPSITE 4X5.5 SM (GAUZE/BANDAGES/DRESSINGS) ×4 IMPLANT
DRSG VAC ATS LRG SENSATRAC (GAUZE/BANDAGES/DRESSINGS) ×8 IMPLANT
ELECT REM PT RETURN 9FT ADLT (ELECTROSURGICAL) ×4
ELECTRODE REM PT RTRN 9FT ADLT (ELECTROSURGICAL) ×2 IMPLANT
GAUZE SPONGE 4X4 12PLY STRL (GAUZE/BANDAGES/DRESSINGS) ×4 IMPLANT
GLOVE BIO SURGEON STRL SZ 6.5 (GLOVE) ×3 IMPLANT
GLOVE BIO SURGEON STRL SZ7.5 (GLOVE) ×12 IMPLANT
GLOVE BIO SURGEONS STRL SZ 6.5 (GLOVE) ×1
GLOVE BIOGEL PI IND STRL 6.5 (GLOVE) ×4 IMPLANT
GLOVE BIOGEL PI IND STRL 7.0 (GLOVE) ×2 IMPLANT
GLOVE BIOGEL PI IND STRL 8 (GLOVE) ×2 IMPLANT
GLOVE BIOGEL PI INDICATOR 6.5 (GLOVE) ×4
GLOVE BIOGEL PI INDICATOR 7.0 (GLOVE) ×2
GLOVE BIOGEL PI INDICATOR 8 (GLOVE) ×2
GOWN STRL REUS W/ TWL LRG LVL3 (GOWN DISPOSABLE) ×6 IMPLANT
GOWN STRL REUS W/TWL LRG LVL3 (GOWN DISPOSABLE) ×6
KIT BASIN OR (CUSTOM PROCEDURE TRAY) ×4 IMPLANT
KIT ROOM TURNOVER OR (KITS) ×4 IMPLANT
NS IRRIG 1000ML POUR BTL (IV SOLUTION) ×4 IMPLANT
PACK CV ACCESS (CUSTOM PROCEDURE TRAY) IMPLANT
PACK GENERAL/GYN (CUSTOM PROCEDURE TRAY) IMPLANT
PACK UNIVERSAL I (CUSTOM PROCEDURE TRAY) ×4 IMPLANT
PAD ARMBOARD 7.5X6 YLW CONV (MISCELLANEOUS) ×8 IMPLANT
PAD NEG PRESSURE SENSATRAC (MISCELLANEOUS) ×8 IMPLANT
SPONGE GAUZE 4X4 12PLY STER LF (GAUZE/BANDAGES/DRESSINGS) ×4 IMPLANT
SUT ETHILON 3 0 PS 1 (SUTURE) ×4 IMPLANT
SUT VIC AB 2-0 CTB1 (SUTURE) IMPLANT
SUT VIC AB 3-0 SH 27 (SUTURE)
SUT VIC AB 3-0 SH 27X BRD (SUTURE) IMPLANT
SUT VICRYL 4-0 PS2 18IN ABS (SUTURE) ×4 IMPLANT
TAPE CLOTH SURG 4X10 WHT LF (GAUZE/BANDAGES/DRESSINGS) ×4 IMPLANT
WATER STERILE IRR 1000ML POUR (IV SOLUTION) ×4 IMPLANT

## 2014-10-20 NOTE — Progress Notes (Signed)
Initial Nutrition Assessment  DOCUMENTATION CODES:   Not applicable  INTERVENTION:    If TF started, recommend:   Vital AF 1.2 formula -- initiate at 25 ml/hr and increase by 10 ml every 4 hours to goal rate of 45 ml/hr   Prostat liquid protein 30 ml TID via tube  Total TF regimen to provide 1596 kcals, 126 gm protein, 876 ml of free water  NUTRITION DIAGNOSIS:   Inadequate oral intake related to inability to eat as evidenced by NPO status  GOAL:   Patient will meet greater than or equal to 90% of their needs  MONITOR:   Vent status, Labs, Weight trends, Skin, I & O's  REASON FOR ASSESSMENT:   Ventilator  ASSESSMENT:   81 yo Male with a history of HTN and PVD; admitted on 11/11/2014. He had an endovascular repair of an abdominal aortic aneurysm on 10/24/2014. This procedure was complicated by left leg ischemia requiring a fasciotomy for compartment syndrome.   Pt s/p procedure 9/2: 4 COMPARTMENT FASCIOTOMY RIGHT LOWER EXTREMITY PLACEMENT OF NEGATIVE PRESSURE DRESSING  Patient is currently intubated on ventilator support -- OGT in place MV: 10.1 L/min Temp (24hrs), Avg:97.2 F (36.2 C), Min:96 F (35.6 C), Max:97.9 F (36.6 C)   RD unable to complete Nutrition Focused Physical Exam at this time.  Diet Order:  Diet NPO time specified  Skin:  Reviewed, no issues (R leg wound VAC)  Last BM:  9/1  Height:   Ht Readings from Last 1 Encounters:  10/24/2014 '5\' 8"'$  (1.727 m)    Weight:   Wt Readings from Last 1 Encounters:  11/01/2014 158 lb (71.668 kg)    Ideal Body Weight:  70 kg  BMI:  Body mass index is 24.03 kg/(m^2).  Estimated Nutritional Needs:   Kcal:  8675  Protein:  120-130 gm  Fluid:  per MD  EDUCATION NEEDS:   No education needs identified at this time  Arthur Holms, RD, LDN Pager #: [phone] After-Hours Pager #: [phone]

## 2014-10-20 NOTE — Progress Notes (Addendum)
   VASCULAR SURGERY ASSESSMENT & PLAN:  * 1 Day Post-Op s/p: Fenestrated Endovascular Aneurysm Repair  *  Increased swelling in right calf. Will need fasciotomy this AM. Discussed with patient and his daughter.  * Cardiac: On Neo, Levo, and Vasopressin  * Pulmonary: good oxygenation  * Renal: crt up. 8cc overnight. CCM/ Renal to start CVVH.   * On bicarb drip for acidosis  * CCM plans placement of catheter to begin CVVH  * Thrombocytopenia: No heparin  * Coagulopathic. INR = 4.92. Receiving Vitamin K. May need FFP if bleeding issues with surgery.  * Appreciate CCM's help with this critically ill patient.  * CCM has asked me to place the HD catheter. I have discussed the procedure and risk with the patient's daughter. He is at risk for bleeding complications because of his coagulopathy. Will give FFP and do under ultrasound guidance.  SUBJECTIVE: Arousable.   PHYSICAL EXAM: Filed Vitals:   11/13/2014 0615 10/28/2014 0630 11/07/2014 0645 10/27/2014 0700  BP:      Pulse:      Temp:      TempSrc:      Resp: 15 11 0 14  Height:      Weight:      SpO2:       PT signal with doppler bilat. No DP signal Both feet slightly mottled. Right calf more swollen Able to dorsiflex and plantar flex both feet Abdomen slightly distended Lungs clear  LABS: Lab Results  Component Value Date   WBC 23.0* 11/08/2014   HGB 10.6* 11/03/2014   HCT 33.9* 11/02/2014   MCV 84.5 11/14/2014   PLT 90* 11/06/2014   Lab Results  Component Value Date   CREATININE 3.15* 10/22/2014   Lab Results  Component Value Date   INR 4.92* 11/05/2014   Active Problems:   Abdominal aortic aneurysm without rupture   Shock circulatory   Acute respiratory failure with hypoxia   Alfred Hall Beeper: [phone] 10/27/2014

## 2014-10-20 NOTE — Progress Notes (Signed)
POD#1, s/p FEVAR adn left leg fasciotomy  Remains in critical condition.  Required right leg fasciotomy today by Dr. Scot Dock On pressors for circulatory support Coagulopathy being addressed  Patient is in circulatory shock, likely secondary to acidosis from reperfusion injury to bilateral lower extremity ischemia, which was secondary to occlusion of bilateral iliac occlusion while the sheaths were in place for his FEVAR  Renal:  Requiring CVVHD for acute renal failure.   Acidosis:  Combined metabolic and respiratory.  Bicarb being replaced with HD.  Vent settings adjusting for respiratory component Shock Liver:  Secondary to severe acidosis from lower extremity washout.  Could also be from embolic disease.  Low suspicion for stent graft coverage of SMA and definitely not celiac.  Mesenteric and renal duplex ordered.  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  Still most likely explanation is reperfusion injury.  Continue full support  Spoke at length with Daughter about current state and possible outcomes   Alfred Hall

## 2014-10-20 NOTE — Progress Notes (Signed)
[facility] Physician-Brief Progress Note Patient Name: CHANTZ MONTEFUSCO DOB: 11/10/1929 MRN: [id number]   Date of Service  10/23/2014  HPI/Events of Note   Recent Labs Lab 11/14/2014 1514 11/07/2014 1539 11/12/2014 0524  INR 1.19 2.30* 4.92*    Recent Labs Lab 10/23/2014 1900 11/14/2014 2340 11/09/2014 0523  HGB 12.2* 11.6* 10.6*  HCT 38.3* 37.9* 33.9*  WBC 23.1* 24.6* 23.0*  PLT 122* 109* 90*    Recent Labs Lab 10/28/2014 0432  10/26/2014 1511 10/25/2014 1539 11/13/2014 1900 11/10/2014 2340 11/14/2014 0523  NA 136  < > 138 141 142 139 140  K 4.7  < > 6.5* 7.0* 6.4* 5.2* 5.4*  CL 101  --   --  99* 99* 103 103  CO2 26  --   --  22 24 15* 13*  GLUCOSE 99  --   --  147* 178* 325* 304*  BUN 23*  --   --  20 22* 27* 26*  CREATININE 1.74*  --   --  2.07* 2.42* 2.89* 3.15*  CALCIUM 9.0  --   --  7.2* 8.1* 7.1* 6.5*  MG  --   --   --  2.3  --   --  2.3  PHOS  --   --   --   --   --   --  9.3*  < > = values in this interval not displayed. CK MB VERY HIGH  eICU Interventions  RHABDO ATN ACIDOSIS HyperKalemia ? DIC   PLAN Vit K Needs CVVH Calcium gluconate     Intervention Category Intermediate Interventions: Coagulopathy - evaluation and management;Diagnostic test evaluation  Cypress Fanfan 10/25/2014, 6:41 AM

## 2014-10-20 NOTE — Progress Notes (Signed)
Pt arrived back from OR, placed back on vent at this time.  Spo2 reading 50-60's, ABG done spo2 100%.  CCM at bedside, RR inc to 24 per order due to ABG results.  RT will continue to monitor.

## 2014-10-20 NOTE — Anesthesia Postprocedure Evaluation (Signed)
  Anesthesia Post-op Note  Patient: Alfred Hall  Procedure(s) Performed: Procedure(s): Four Compartment Fasciotomy Right Leg; Extension of Previous Fasciotomy Left Leg (Bilateral) Insertion of Dialysis Catheter Rigth Internal Jugular (Right)  Patient Location: ICU  Anesthesia Type:General  Level of Consciousness: sedated, unresponsive and Patient remains intubated per anesthesia plan  Airway and Oxygen Therapy: Patient remains intubated per anesthesia plan  Post-op Pain: sedated, unable to evaluate  Post-op Assessment: Post-op Vital signs reviewed and Patient's Cardiovascular Status Stable  On pressors  LLE Motor Response: Purposeful movement, Responds to commands LLE Sensation: Full sensation RLE Motor Response: Purposeful movement, Responds to commands RLE Sensation: Full sensation      Post-op Vital Signs: Reviewed  Last Vitals:  Filed Vitals:   11/09/2014 1145  BP:   Pulse:   Temp: 35.6 C  Resp:     Complications: No apparent anesthesia complications

## 2014-10-20 NOTE — Progress Notes (Signed)
CRITICAL VALUE ALERT  Critical value received:  Lactic acid-17.1  Date of notification:  11/08/2014  Time of notification:  9532  Critical value read back:Yes.    Nurse who received alert:  Vickii Penna, RN  MD notified:  Dr. [name]  Time of first page: 0730  MD notified (2nd page):  Time of second page:  Responding MD:  Dr. [name]  Time MD responded:  0233

## 2014-10-20 NOTE — Progress Notes (Signed)
[facility] Physician-Brief Progress Note Patient Name: ZACKORY PUDLO DOB: 25-Sep-1929 MRN: [id number]   RN calling eMD  1. Maxed out on 2 pressors 2. Anuric with rising Creat  3. Metabolic acidosis +   LABS  PULMONARY  Recent Labs Lab 11/09/2014 1416 11/13/2014 1511 11/15/2014 1617 11/10/2014 1857 11/14/2014 0320  PHART 7.060* 7.184* 7.307* 7.322* 7.042*  PCO2ART 58.6* 48.6* 38.9 46.5* 38.6  PO2ART 141.0* 118.0* 273* 111.0* 105*  HCO3 16.7* 18.3* 18.9* 24.2* 10.1*  TCO2 18 20 20.1 26 11.3  O2SAT 98.0 97.0 99.3 98.0 94.6    CBC  Recent Labs Lab 11/13/2014 1539 10/23/2014 1900 10/27/2014 2340  HGB 11.2* 12.2* 11.6*  HCT 34.5* 38.3* 37.9*  WBC 16.8* 23.1* 24.6*  PLT 108* 122* 109*    COAGULATION  Recent Labs Lab 11/06/2014 1514 10/26/2014 1539  INR 1.19 2.30*    CARDIAC  No results for input(s): TROPONINI in the last 168 hours. No results for input(s): PROBNP in the last 168 hours.   CHEMISTRY  Recent Labs Lab 10/28/2014 1514 10/27/2014 0432 11/14/2014 1416 10/26/2014 1511 10/27/2014 1539 10/30/2014 1900 11/04/2014 2340  NA 136 136 135 138 141 142 139  K 4.4 4.7 6.1* 6.5* 7.0* 6.4* 5.2*  CL 102 101  --   --  99* 99* 103  CO2 27 26  --   --  22 24 15*  GLUCOSE 141* 99  --   --  147* 178* 325*  BUN 30* 23*  --   --  20 22* 27*  CREATININE 1.85* 1.74*  --   --  2.07* 2.42* 2.89*  CALCIUM 8.8* 9.0  --   --  7.2* 8.1* 7.1*  MG  --   --   --   --  2.3  --   --    Estimated Creatinine Clearance: 18.7 mL/min (by C-G formula based on Cr of 2.89).   LIVER  Recent Labs Lab 10/31/2014 1514 11/12/2014 1539  AST 29  --   ALT 25  --   ALKPHOS 54  --   BILITOT 0.6  --   PROT 6.4*  --   ALBUMIN 3.4*  --   INR 1.19 2.30*     INFECTIOUS No results for input(s): LATICACIDVEN, PROCALCITON in the last 168 hours.   ENDOCRINE CBG (last 3)  No results for input(s): GLUCAP in the last 72 hours.     A) ATN - likely B) metab acidosis  PLAN  - fluid bolus  -start bicarb gtt -  recheck labs 5am - might need CVVH - continue pressors     Intervention Category Major Interventions: Acid-Base disturbance - evaluation and management  Channelle Bottger 11/01/2014, 3:51 AM

## 2014-10-20 NOTE — Progress Notes (Signed)
CRITICAL VALUE ALERT  Critical value received:  INR 6.92  Date of notification:  10/28/2014  Time of notification:  1923  Critical value read back:Yes.    Nurse who received alert:  Lyla Son  MD notified (1st page):  Dr. Oletta Darter  Time of first page:  1949, called at [place]

## 2014-10-20 NOTE — Progress Notes (Signed)
Dr. Chase Caller notified of pts BP despite pressors and pt ABG results.  Orders received.  Will monitor pt.

## 2014-10-20 NOTE — Progress Notes (Signed)
ANTIBIOTIC CONSULT NOTE - INITIAL  Pharmacy Consult for vancomycin and Zosyn Indication: sepsis likely from L leg  No Known Allergies  Patient Measurements: Height: '5\' 8"'$  (172.7 cm) Weight: 158 lb (71.668 kg) IBW/kg (Calculated) : 68.4 Adjusted Body Weight:   Vital Signs: Temp: 95.8 F (35.4 C) (09/03 1215) Temp Source: Axillary (09/03 1215) BP: 103/43 mmHg (09/03 0742) Pulse Rate: 61 (09/03 1215) Intake/Output from previous day: 09/02 0701 - 09/03 0700 In: 74081 [I.V.:6912; Blood:670; NG/GT:240; IV Piggyback:4660] Out: 2284 [Urine:1734; Drains:275; Blood:275] Intake/Output from this shift: Total I/O In: 3660.5 [I.V.:2192.5; Blood:1218; IV Piggyback:250] Out: 50 [Blood:50]  Labs:  Recent Labs  10/28/2014 1900 11/07/2014 2340 11/14/2014 0523 11/10/2014 0947 11/08/2014 1034 11/05/2014 1042  WBC 23.1* 24.6* 23.0*  --   --   --   HGB 12.2* 11.6* 10.6* 8.5* 8.2* 8.8*  PLT 122* 109* 90*  --   --   --   CREATININE 2.42* 2.89* 3.15*  --   --   --    Estimated Creatinine Clearance: 17.2 mL/min (by C-G formula based on Cr of 3.15). No results for input(s): VANCOTROUGH, VANCOPEAK, VANCORANDOM, GENTTROUGH, GENTPEAK, GENTRANDOM, TOBRATROUGH, TOBRAPEAK, TOBRARND, AMIKACINPEAK, AMIKACINTROU, AMIKACIN in the last 72 hours.   Microbiology: Recent Results (from the past 720 hour(s))  Surgical pcr screen     Status: None   Collection Time: 10/12/14  1:59 PM  Result Value Ref Range Status   MRSA, PCR NEGATIVE NEGATIVE Final   Staphylococcus aureus NEGATIVE NEGATIVE Final    Comment:        The Xpert SA Assay (FDA approved for NASAL specimens in patients over 73 years of age), is one component of a comprehensive surveillance program.  Test performance has been validated by [facility] for patients greater than or equal to 6 year old. It is not intended to diagnose infection nor to guide or monitor treatment.     Medical History: Past Medical History  Diagnosis Date  . PVD  (peripheral vascular disease)   . Microhematuria     a. workup negative  . Aortic aneurysm, abdominal   . Hypercholesteremia   . Hypertension   . Thoracic aortic aneurysm without rupture   . Aortic stenosis     a. ECHO at [place]- pk AV grad 77 with a mean rate of 53   . Tobacco abuse   . CAD (coronary artery disease)     a. s/p LHC on 09/18/13 with non-obs disease and severe AS  . COPD (chronic obstructive pulmonary disease)   . Hypoglycemia   . Abdominal aortic aneurysm   . Wears glasses   . Lung cancer   . Radiation     treatment for lung  . History of blood transfusion     "don't remember why"  . Gout     "have had it in my feet & in my right hand"    Medications:  Prescriptions prior to admission  Medication Sig Dispense Refill Last Dose  . aspirin EC 81 MG tablet Take 1 tablet (81 mg total) by mouth daily. 30 tablet  Taking  . atorvastatin (LIPITOR) 20 MG tablet Take 1 tablet (20 mg total) by mouth daily at 6 PM. 31 tablet 6 Taking  . cholecalciferol (VITAMIN D) 1000 UNITS tablet Take 1,000 Units by mouth daily.    Taking  . Garlic 448 MG CAPS Take 300 mg by mouth daily.    Taking  . metoprolol tartrate (LOPRESSOR) 25 MG tablet Take 0.5 tablets (12.5 mg total)  by mouth 2 (two) times daily. 62 tablet 6 Taking  . Multiple Vitamins-Minerals (CENTRUM SILVER ADULT 50+ PO) Take 1 tablet by mouth daily.   Taking  . amiodarone (PACERONE) 200 MG tablet TAKE 1 TABLET BY MOUTH EVERY DAY 30 tablet 11    Assessment: 81 y/o male s/p endovascular repair of an abdominal aortic aneurysm on 9/2 complicated by left leg ischemia requiring a fasciotomy for compartment syndrome also on 9/2 and repeat fasciotomy today. Pharmacy consulted to begin vancomycin and Zosyn for sepsis likely from L leg. He is in ARF, HD cath placed and is to start CRRT today. He is afebrile and WBC are elevated. Cultures are pending.  Goal of Therapy:  Vancomycin trough level 15-20 mcg/ml Eradication of infection  Plan:   - Vancomycin 1250 mg IV now then 750 mg IV q24h - Zosyn 3.375 g IV q6h - Adjust antibiotics for renal function once off CRRT - Monitor renal function, clinical progress, and culture data  [facility], [place].D., BCPS Clinical Pharmacist Pager: [phone] 11/09/2014 12:35 PM

## 2014-10-20 NOTE — Transfer of Care (Signed)
Immediate Anesthesia Transfer of Care Note  Patient: Alfred Hall  Procedure(s) Performed: Procedure(s): Four Compartment Fasciotomy Right Leg; Extension of Previous Fasciotomy Left Leg (Bilateral) Insertion of Dialysis Catheter Rigth Internal Jugular (Right)  Patient Location: ICU  Anesthesia Type:General  Level of Consciousness: sedated, unresponsive and Patient remains intubated per anesthesia plan  Airway & Oxygen Therapy: Patient remains intubated per anesthesia plan and Patient placed on Ventilator (see vital sign flow sheet for setting)  Post-op Assessment: Report given to RN and currently stable on pressors  Post vital signs: Reviewed  Last Vitals:  Filed Vitals:   11/15/2014 0845  BP:   Pulse: 81  Temp:   Resp: 17    Complications: No apparent anesthesia complications

## 2014-10-20 NOTE — Progress Notes (Signed)
CRITICAL VALUE ALERT  Critical value received:  K-6.4  Date of notification:  11/02/2014  Time of notification:  1940  Critical value read back:Yes.    Nurse who received alert:  Dillard Essex  MD notified (1st page):  Dr. Diona Fanti  Time of first page:  1940  MD notified (2nd page):  Time of second page:  Responding MD:  Dr. Diona Fanti  Time MD responded:  [phone]

## 2014-10-20 NOTE — Procedures (Signed)
I was present at this dialysis session. I have reviewed the session itself and made appropriate changes.   So far tolerating CRRT.  Follow labs. Pressor req improving  Pearson Grippe  MD 10/23/2014, 1:03 PM

## 2014-10-20 NOTE — Consult Note (Signed)
Alfred Hall Admit Date: 10/27/2014 11/14/2014 Alfred Hall Requesting Physician:  Alfred Caller MD  Reason for Consult:  AKI, Hyperkalemia, Metabolic Acidosis, Shock, Rhabdomyolysis HPI:  38M s/p fenestrated endovascular aneyrums repair 9/2 c/b compartment syndrom L calf and R calf s/p fasciotomy of L and planned of R today who has developed anuric AKI, Hyperkalemia, met/resp acidosis, refractory shock on 3 pressors (PE, NE, VP), rhabdomyolysis with CK> 10,000, VDRF hyperphosphatemia.  Preop SCr 1.8.    PMH included T2NO lung cancer s/p XRT, PVD, Aortic Stenosis   CREATININE, SER (mg/dL)  Date Value  10/25/2014 3.15*  11/03/2014 2.89*  11/04/2014 2.42*  10/23/2014 2.07*  10/30/2014 1.74*  10/26/2014 1.85*  10/12/2014 1.82*  01/24/2014 1.4  01/04/2014 1.38*  01/02/2014 1.35  ] I/Os:  ROS NSAIDS:  IV Contrast during procedure 9/2 TMP/SMX none Hypotension present curently Balance of 12 systems is negative w/ exceptions as above  PMH  Past Medical History  Diagnosis Date  . PVD (peripheral vascular disease)   . Microhematuria     a. workup negative  . Aortic aneurysm, abdominal   . Hypercholesteremia   . Hypertension   . Thoracic aortic aneurysm without rupture   . Aortic stenosis     a. ECHO at [place]- pk AV grad 77 with a mean rate of 53   . Tobacco abuse   . CAD (coronary artery disease)     a. s/p LHC on 09/18/13 with non-obs disease and severe AS  . COPD (chronic obstructive pulmonary disease)   . Hypoglycemia   . Abdominal aortic aneurysm   . Wears glasses   . Lung cancer   . Radiation     treatment for lung  . History of blood transfusion     "don't remember why"  . Gout     "have had it in my feet & in my right hand"   [place]  Past Surgical History  Procedure Laterality Date  . Cataract extraction w/ intraocular lens implant Right   . Throat nodule excision    . Nasal hemorrhage control    . Cardiac catheterization    . Tonsillectomy    . Appendectomy     . Ascending aortic root replacement N/A 12/26/2013    Procedure: ASCENDING AORTIC  REPLACEMENT;  Surgeon: Grace Isaac, MD;  Location: [facility];  Service: Open Heart Surgery;  Laterality: N/A;  . Intraoperative transesophageal echocardiogram N/A 12/26/2013    Procedure: INTRAOPERATIVE TRANSESOPHAGEAL ECHOCARDIOGRAM;  Surgeon: Grace Isaac, MD;  Location: [facility];  Service: Open Heart Surgery;  Laterality: N/A;  . Lung biopsy Left 12/26/2013    Procedure: LUNG BIOPSY;  Surgeon: Grace Isaac, MD;  Location: [facility];  Service: Open Heart Surgery;  Laterality: Left;  . Coronary artery bypass graft N/A 12/26/2013    Procedure: CORONARY ARTERY BYPASS GRAFTING (CABG) times one using the left internal mammary artery.;  Surgeon: Grace Isaac, MD;  Location: [facility];  Service: Open Heart Surgery;  Laterality: N/A;  . Aortic valve replacement  12/26/2013    Procedure: AORTIC VALVE REPLACEMENT (AVR);  Surgeon: Grace Isaac, MD;  Location: [facility];  Service: Open Heart Surgery;;  . Left and right heart catheterization with coronary angiogram N/A 09/18/2013    Procedure: LEFT AND RIGHT HEART CATHETERIZATION WITH CORONARY ANGIOGRAM;  Surgeon: Blane Ohara, MD;  Location: [facility] CATH LAB;  Service: Cardiovascular;  Laterality: N/A;  . Cataract extraction Left     "have a hard contact in that eye"  .  Cardiac valve replacement     FH  Family History  Problem Relation Age of Onset  . Renal Disease Father   . Congestive Heart Failure Mother    SH  reports that he quit smoking about 9 months ago. His smoking use included Cigarettes. He has a 30 pack-year smoking history. He has never used smokeless tobacco. He reports that he drinks alcohol. He reports that he does not use illicit drugs. Allergies No Known Allergies Home medications Prior to Admission medications   Medication Sig Start Date End Date Taking? Authorizing Provider  aspirin EC 81 MG tablet Take 1 tablet (81 mg total) by mouth daily.  09/27/14  Yes Grace Isaac, MD  atorvastatin (LIPITOR) 20 MG tablet Take 1 tablet (20 mg total) by mouth daily at 6 PM. 04/06/14  Yes Thayer Headings, MD  cholecalciferol (VITAMIN D) 1000 UNITS tablet Take 1,000 Units by mouth daily.    Yes Historical Provider, MD  Garlic 092 MG CAPS Take 300 mg by mouth daily.    Yes Historical Provider, MD  metoprolol tartrate (LOPRESSOR) 25 MG tablet Take 0.5 tablets (12.5 mg total) by mouth 2 (two) times daily. 04/06/14  Yes Thayer Headings, MD  Multiple Vitamins-Minerals (CENTRUM SILVER ADULT 50+ PO) Take 1 tablet by mouth daily.   Yes Historical Provider, MD  amiodarone (PACERONE) 200 MG tablet TAKE 1 TABLET BY MOUTH EVERY DAY 10/15/14   Thayer Headings, MD    Current Medications Scheduled Meds: . sodium chloride   Intravenous Once  . sodium chloride   Intravenous Once  . sodium chloride  10 mL/hr Intravenous Once  . [MAR Hold] amiodarone  200 mg Oral Daily  . [MAR Hold] antiseptic oral rinse  7 mL Mouth Rinse QID  . [MAR Hold] atorvastatin  20 mg Oral q1800  . [MAR Hold] chlorhexidine gluconate  15 mL Mouth Rinse BID  . [MAR Hold] enoxaparin (LOVENOX) injection  30 mg Subcutaneous Q24H  . [MAR Hold] famotidine (PEPCID) IV  20 mg Intravenous Q12H  . fentaNYL (SUBLIMAZE) injection  50 mcg Intravenous Once  . Influenza vac split quadrivalent PF  0.5 mL Intramuscular Tomorrow-1000   Continuous Infusions: . sodium chloride 75 mL/hr at 10/30/2014 2135  . fentaNYL infusion INTRAVENOUS 100 mcg/hr (11/15/2014 0949)  . norepinephrine (LEVOPHED) Adult infusion Stopped (11/14/2014 0923)  . [MAR Hold] phenylephrine (NEO-SYNEPHRINE) Adult infusion Stopped (10/22/2014 0919)  . dialysis replacement fluid (prismasate)    . dialysis replacement fluid (prismasate)    . dialysate (PRISMASATE)    .  sodium bicarbonate  infusion 1000 mL 200 mL/hr (10/29/2014 0857)  . vasopressin (PITRESSIN) infusion - *FOR SHOCK* 0.03 Units/min (10/25/2014 0800)   PRN Meds:.0.9 % irrigation  (POUR BTL), [MAR Hold] albuterol, [MAR Hold] alum & mag hydroxide-simeth, [MAR Hold] bisacodyl, [MAR Hold] fentaNYL, [MAR Hold] guaiFENesin-dextromethorphan, heparin, [MAR Hold] magnesium sulfate 1 - 4 g bolus IVPB, [MAR Hold] ondansetron, [MAR Hold] phenol, [MAR Hold] polyethylene glycol, [MAR Hold] potassium chloride, sodium chloride  CBC  Recent Labs Lab 11/08/2014 1900 11/09/2014 2340 10/22/2014 0523  WBC 23.1* 24.6* 23.0*  HGB 12.2* 11.6* 10.6*  HCT 38.3* 37.9* 33.9*  MCV 79.6 82.0 84.5  PLT 122* 109* 90*   Basic Metabolic Panel  Recent Labs Lab 11/03/2014 1514 11/12/2014 0432 11/01/2014 1416 10/26/2014 1511 11/14/2014 1539 11/04/2014 1900 10/25/2014 2340 10/26/2014 0523  NA 136 136 135 138 141 142 139 140  K 4.4 4.7 6.1* 6.5* 7.0* 6.4* 5.2* 5.4*  CL 102 101  --   --  99* 99* 103 103  CO2 27 26  --   --  22 24 15* 13*  GLUCOSE 141* 99  --   --  147* 178* 325* 304*  BUN 30* 23*  --   --  20 22* 27* 26*  CREATININE 1.85* 1.74*  --   --  2.07* 2.42* 2.89* 3.15*  CALCIUM 8.8* 9.0  --   --  7.2* 8.1* 7.1* 6.5*  PHOS  --   --   --   --   --   --   --  9.3*    Physical Exam  Blood pressure 103/43, pulse 81, temperature 97.5 F (36.4 C), temperature source Oral, resp. rate 17, height 5' 8"  (1.727 m), weight 71.668 kg (158 lb), SpO2 99 %. GEN: intubated/sedated ENT: NCAT / ETT in place EYES: EOMI CV: RRR PULM: CTAB ABD: mild distension SKIN: mottling EXT:b/o wound vac on calves   Assessment/Plan 21M w/ refractory shock, VDRF, anuric AKI, met/resp acidosis, rhabdomyolysis, b/l LE compartment syndrome s/p AAA endovascular repair 9/2.    1. Anuric AoCKD 2/2 Rhabdomyoysis and contrast 1. BL SCr 1.5-2.0 2. Now with profound acidosis, anuric, shock 3. Start CRRT, aggressive settings to improve metabolic issues 4. No heparin in circuit 5. Net even UF for now 6. HD catheter to be placed on OR 2. Rhabdomyolysis 2/2 compartment syndrome 1. For R calf fasciotomy 9/3 2. S/p L calf fasciotomy  9/2 3. Rec alkali therapy, no UOP 3. S/p AAA endovascular repair 9/2 4. VDRF 5. Refractory shock on VP, NE, PE 6. Hyperkalemia 2/2 #2 and #1 7. Hyperphosphatemia 2/2 #2 8. PVD 9. T2N0 Lung Cancer 10. Hx/o HTN   Pearson Grippe MD [phone] pgr 10/26/2014, 9:56 AM

## 2014-10-20 NOTE — Progress Notes (Signed)
[facility] Progress Note Patient Name: SHILOH SWOPES DOB: 1929/08/15 MRN: [id number]   Date of Service  11/16/2014  HPI/Events of Note  Multiple issues:  1. INR = 4.92 >> 6.92 s/p Vitamin K 10 units and FFP 2 units earlier today. 2. Platelets = 32K and 3. Blood glucose = 164.  eICU Interventions  Will order: 1. FFP 6 units IV. 2. Platelets 1 unit Pharesed Platelets. 3. DIC panel at 12 midnight. 4. Q 4 hour Accuchecks with sensitive Novolog SSI now.      Intervention Category Major Interventions: Other: Intermediate Interventions: Hyperglycemia - evaluation and treatment  Sommer,Steven Eugene 11/14/2014, 7:49 PM

## 2014-10-20 NOTE — Progress Notes (Signed)
eLink Physician-Brief Progress Note Patient Name: KENDRICK HAAPALA DOB: [birth year] MRN: [id number]   Date of Service  11/08/2014  HPI/Events of Note   Recent Labs Lab 11/15/2014 1511 11/06/2014 1617 10/28/2014 1857 11/14/2014 0320 10/28/2014 0512  PHART 7.184* 7.307* 7.322* 7.042* 7.077*  PCO2ART 48.6* 38.9 46.5* 38.6 43.2  PO2ART 118.0* 273* 111.0* 105* 131*  HCO3 18.3* 18.9* 24.2* 10.1* 12.1*  TCO2 20 20.1 26 11.3 13.5  O2SAT 97.0 99.3 98.0 94.6 96.7     eICU Interventions  abg only marginally better after statting bic bolus and gtt. PLAN  - increase bic rate from 150cc to 200cc/h - consent family for HD cath - by RN - aim for CVVH  - await labs     Intervention Category Major Interventions: Acid-Base disturbance - evaluation and management  Gumecindo Hopkin 11/09/2014, 5:28 AM

## 2014-10-20 NOTE — Progress Notes (Signed)
PULMONARY / CRITICAL CARE MEDICINE   Name:Giordano SHAQUON GROPP JKD:[id number] DOB:1929/10/19   ADMISSION DATE: 11/05/2014 CONSULTATION DATE: 01/18/2015  REFERRING MD : Dr. Trula Slade, vascular surgery  CHIEF COMPLAINT: Postoperative medical management and ventilator management  INITIAL PRESENTATION:  81 year old male with a history of hypertension and peripheral vascular disease was admitted on 10/26/2014. He had an endovascular repair of an abdominal aortic aneurysm on 11/14/2014. This procedure was complicated by left leg ischemia requiring a fasciotomy for compartment syndrome.  STUDIES:    SIGNIFICANT EVENTS: 11/02/2014 endovascular repair of juxtarenal aortic aneurysm 11/06/2014 emergent left leg fasciotomy due to leg ischemia and reperfusion injury 9/3 shock overnight. Persistently acidotic.  repeat left leg fascitomy and placement of HD cath   SUBJECTIVE: Sedated and intubated post op. In shock and critically ill  VITAL SIGNS: Temp:  [96 F (35.6 C)-97.9 F (36.6 C)] 96 F (35.6 C) (09/03 1145) Pulse Rate:  [57-86] 81 (09/03 0845) Resp:  [0-36] 17 (09/03 0845) BP: (99-185)/(26-72) 103/43 mmHg (09/03 0742) SpO2:  [95 %-100 %] 99 % (09/03 0845) Arterial Line BP: (67-144)/(22-58) 106/50 mmHg (09/03 0845) FiO2 (%):  [50 %-100 %] 50 % (09/03 0742)  . sodium chloride 75 mL/hr at 10/27/2014 2135  . fentaNYL infusion INTRAVENOUS 100 mcg/hr (10/25/2014 0949)  . norepinephrine (LEVOPHED) Adult infusion 6 mcg/min (11/02/2014 1145)  . phenylephrine (NEO-SYNEPHRINE) Adult infusion Stopped (11/10/2014 0919)  . dialysis replacement fluid (prismasate)    . dialysis replacement fluid (prismasate)    . dialysate (PRISMASATE)    .  sodium bicarbonate  infusion 1000 mL 200 mL/hr (10/26/2014 0857)  . vasopressin (PITRESSIN) infusion - *FOR SHOCK* 0.03 Units/min (10/28/2014 0857)    HEMODYNAMICS:   VENTILATOR SETTINGS: Vent Mode:  [-] PRVC FiO2 (%):  [50 %-100 %] 50 % Set Rate:  [14  bmp-22 bmp] 14 bmp Vt Set:  [550 mL] 550 mL PEEP:  [5 cmH20] 5 cmH20 Plateau Pressure:  [9 KDX83-38 cmH20] 9 cmH20 INTAKE / OUTPUT:  Intake/Output Summary (Last 24 hours) at 11/06/2014 1201 Last data filed at 11/02/2014 1145  Gross per 24 hour  Intake 15212.52 ml  Output   1459 ml  Net 13753.52 ml    PHYSICAL EXAMINATION: General:  Chronically ill elderly appearing male. Neuro:  Sedated.  HEENT:  [place]/AT, pupil sluggish but reactive, no EOM and MMM. Orally intubated  Cardiovascular:  RRR, Nl S1/S2, -M/R/G. Lungs:  Coarse BS diffusely. Abdomen:  Soft, NT, ND and no BS. Musculoskeletal:  Cyanotic bilateral feet. Skin:  Surgical sites noted, no puss, cyanotic feet remain   LABS:  CBC  Recent Labs Lab 11/04/2014 1900 10/30/2014 2340 11/15/2014 0523  WBC 23.1* 24.6* 23.0*  HGB 12.2* 11.6* 10.6*  HCT 38.3* 37.9* 33.9*  PLT 122* 109* 90*   Coag's  Recent Labs Lab 10/24/2014 1514 10/29/2014 1539 10/25/2014 0524  APTT  --  86*  --   INR 1.19 2.30* 4.92*   BMET  Recent Labs Lab 11/07/2014 1900 11/01/2014 2340 11/13/2014 0523  NA 142 139 140  K 6.4* 5.2* 5.4*  CL 99* 103 103  CO2 24 15* 13*  BUN 22* 27* 26*  CREATININE 2.42* 2.89* 3.15*  GLUCOSE 178* 325* 304*   Electrolytes  Recent Labs Lab 11/04/2014 1539 10/24/2014 1900 11/06/2014 2340 11/12/2014 0523  CALCIUM 7.2* 8.1* 7.1* 6.5*  MG 2.3  --   --  2.3  PHOS  --   --   --  9.3*   Sepsis Markers  Recent Labs Lab 10/25/2014 0523  LATICACIDVEN 17.1*   ABG  Recent Labs Lab 11/13/2014 1857 11/15/2014 0320 10/23/2014 0512  PHART 7.322* 7.042* 7.077*  PCO2ART 46.5* 38.6 43.2  PO2ART 111.0* 105* 131*   Liver Enzymes  Recent Labs Lab 10/28/2014 1514 10/23/2014 0524  AST 29 >10000*  ALT 25 7430*  ALKPHOS 54 125  BILITOT 0.6 1.2  ALBUMIN 3.4* 2.0*   Cardiac Enzymes  Recent Labs Lab 11/02/2014 0524  TROPONINI 0.20*   Glucose  Recent Labs Lab 11/04/2014 0812 10/25/2014 0817  GLUCAP 307* 339*    Imaging Dg Chest Port  1 View  11/08/2014   CLINICAL DATA:  Endotracheal tube.  EXAM: PORTABLE CHEST - 1 VIEW  COMPARISON:  11/02/2014  FINDINGS: / endotracheal tube is in place, tip approximately 6.2 cm above carina. Nasogastric tube is in place, tip beyond the film but beyond the gastroesophageal junction. Left IJ central line tip overlies the superior vena cava.  There are coarse lung opacities on the left, stable in appearance. The minimal density at the right lung base, stable in appearance. Status post median sternotomy and valve replacement.  IMPRESSION: No significant change in the appearance of lung opacities. Stable lines and tubes.   Electronically Signed   By: Nolon Nations M.D.   On: 11/15/2014 09:20   Dg Chest Port 1 View  10/28/2014   CLINICAL DATA:  Central line placement.  Initial encounter.  EXAM: PORTABLE CHEST - 1 VIEW  COMPARISON:  Chest radiograph performed earlier today at 3:45 p.m.  FINDINGS: The patient's endotracheal tube is seen ending 5-6 cm above the carina. A left IJ line is noted ending about the mid SVC. An enteric tube is noted extending below the diaphragm, with the side port about the gastroesophageal junction.  The left upper lobe lung mass is again noted, with surrounding increased interstitial markings and biapical pleural thickening. Previously suggested pulmonary edema is less apparent. No pleural effusion or pneumothorax is seen. The right lung appears relatively clear.  The cardiomediastinal silhouette remains normal in size. The patient is status post median sternotomy. No acute osseous abnormalities are identified.  IMPRESSION: 1. Left IJ line noted ending about the mid SVC. 2. Left upper lobe lung mass again noted, with surrounding increased interstitial markings and biapical pleural thickening. Previously suggested pulmonary edema is less apparent.   Electronically Signed   By: Garald Balding M.D.   On: 11/07/2014 21:18   Dg Chest Port 1 View  11/11/2014   CLINICAL DATA:  Status post or  percutaneous abdominal aortic aneurysm repair, postoperative shortness of breath.  EXAM: PORTABLE CHEST - 1 VIEW  COMPARISON:  PA and lateral chest x-ray of October 18, 2014  FINDINGS: Previously demonstrated left upper lobe mass is again demonstrated. The surrounding interstitial markings are more prominent. Elsewhere in both lungs the pulmonary interstitial markings are mildly increased. There is increased density in the retrocardiac region on the left. Mildly increased interstitial markings in the right lung are stable. The heart is normal in size. A prosthetic aortic valve ring is visible. There are 7 intact sternal wires. The endotracheal tube tip lies 4 cm above the carina. The esophagogastric tube tip terminates above the GE junction and advancement by approximately 15-20 cm is recommended. The abdominal aortic stent graft superior age is visible at the inferior margin of the image.  IMPRESSION: 1. The left upper lobe interstitial markings associated with the known spiculated upper lobe mass are more conspicuous on this postoperative portable supine view. The interstitial markings in the  right lung are mildly increased as well which suggests mild interstitial edema. There is new retrocardiac density on the left consistent with atelectasis. There is no significant pleural effusion. 2. The endotracheal tube is in reasonable position. 3. The esophagogastric tube tip lies above the GE junction. Advancement by 15-20 cm is recommended.   Electronically Signed   By: David  Martinique M.D.   On: 10/28/2014 15:59   Dg Abd Portable 1v  11/09/2014   CLINICAL DATA:  Status post percutaneous abdominal aortic aneurysm repair.  EXAM: PORTABLE ABDOMEN - 1 VIEW  COMPARISON:  CT 08/03/2014  FINDINGS: Status post aorta iliac stent graft. Patient has bilateral renal and left external iliac stents. Significant atherosclerotic calcification of the iliac and femoral arteries. Bowel gas pattern is nonobstructed.  IMPRESSION: Status  post aorta iliac stent graft placement.   Electronically Signed   By: Nolon Nations M.D.   On: 11/13/2014 16:00   Dg Fluoro Guide Cv Line-no Report  11/01/2014   CLINICAL DATA:    FLOURO GUIDE CV LINE  Fluoroscopy was utilized by the requesting physician.  No radiographic  interpretation.    CXR stable. W/ persistent lul changes r/t his cancer. Support tubes in place.   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 Most likely this is due to his acidosis. Also consider blood loss related, but seems less likely.  History of hypertension Aortic stenosis, status post bioprosthetic valve LVEF 45% on August 2016 echocardiogram P:  MAP goal > 65 Even vol status Correct acidosis  Hold amidarone w/ bradycardia   RENAL A:  Acute on chronic renal insufficiency in setting of hypotension, most likely prerenal Severe metabolic acidosis. Lactate 17.1. Most likely a complication of left leg ischemia.  Hyperkalemia   P:  Ensure euvolemia  Start CRRT (nephrology following) Serial labs Strict I&O  GASTROINTESTINAL A: Shock liver P:  Pepcid for stress ulcer prophylaxis F/u lfts am  Nothing by mouth for now, consider tube feeds next 24 hrs if hemodynamics improve Dc lipitor  HEMATOLOGIC A:  Mild anemia postoperatively, no evidence of bleeding at this time thrombocytopenia  P:  Monitor for bleeding Daily CBC F/u cbc may need to hold LMWH  INFECTIOUS A:  Persistent shock/ likely ischemic leg. Surgical report not mentioning concern for infection but given clinical decline sepsis on ddx  Abx: Perioperative cefuroxime 11/13/2014 P BCX2 9/3>>> vanc 9/3>>> Zosyn  9/3>>>  ENDOCRINE A: hyperglycemia  P:  Monitor glucose ssi protocol   NEUROLOGIC A: Sedation needs for vent synchrony P:  RASS goal: -2 PAD protocol    FAMILY  - Updates: No family bedside.  11/10/2014, 12:01 PM  Critically ill. Most likely all from refractory acidosis from ischemic leg. Now back from another fasciotomy. Will add empiric abx in case this is sepsis (doubt), send blood cultures and cont supportive care. He needs emergent HD. May not survive.   Erick Colace ACNP-BC [facility] Pulmonary/Critical Care Pager # [phone] OR # [phone] if no answer  Attending Note:  81 year old vasculopath who underwent a AAA repair who underwent surgical repair.  Back to the OR this AM since his legs were mottled.  The patient was taken to the OR and a fasciotomy  was done.  Came back hypotensive and bradycardic.  Noted to be severely acidotic with a pH of 7.09.  Vent adjusted.  Now aneuric as well and in metabolic acidosis.  Called nephrology.  CRRT to be started.  Stabilized with vent adjustment.  Will continue to follow.  The patient is critically ill with multiple organ systems failure and requires high complexity decision making for assessment and support, frequent evaluation and titration of therapies, application of advanced monitoring technologies and extensive interpretation of multiple databases.   Critical Care Time devoted to patient care services described in this note is  35  Minutes. This time reflects time of care of this signee Dr Jennet Maduro. This critical care time does not reflect procedure time, or teaching time or supervisory time of PA/NP/Med student/Med Resident etc but could involve care discussion time.  Rush Farmer, M.D. [facility] Pulmonary/Critical Care Medicine. Pager: [phone]. After hours pager: [phone].

## 2014-10-20 NOTE — Anesthesia Preprocedure Evaluation (Addendum)
Anesthesia Evaluation  Patient identified by MRN, date of birth, ID band Patient awake    Reviewed: Patient's Chart, lab work & pertinent test resultsPreop documentation limited or incomplete due to emergent nature of procedure.  Airway Mallampati: Intubated  TM Distance: >3 FB Neck ROM: Full    Dental   Pulmonary COPDformer smoker,    Pulmonary exam normal       Cardiovascular hypertension, Pt. on medications + CAD and + Peripheral Vascular Disease Normal cardiovascular exam    Neuro/Psych    GI/Hepatic   Endo/Other    Renal/GU CRF and ARFRenal disease     Musculoskeletal   Abdominal   Peds  Hematology coagulopathic   Anesthesia Other Findings   Reproductive/Obstetrics                           Anesthesia Physical  Anesthesia Plan  ASA: III and emergent  Anesthesia Plan: General   Post-op Pain Management:    Induction: Intravenous  Airway Management Planned: Oral ETT  Additional Equipment:   Intra-op Plan:   Post-operative Plan: Extubation in OR  Informed Consent: I have reviewed the patients History and Physical, chart, labs and discussed the procedure including the risks, benefits and alternatives for the proposed anesthesia with the patient or authorized representative who has indicated his/her understanding and acceptance.     Plan Discussed with: CRNA and Surgeon  Anesthesia Plan Comments:        Anesthesia Quick Evaluation

## 2014-10-20 NOTE — Op Note (Signed)
NAME: Alfred Hall   MRN: [id number] DOB: [birth year]    DATE OF OPERATION: 11/04/2014  PREOP DIAGNOSIS:  1. Compartment syndrome right lower extremity 2. Acute renal failure  POSTOP DIAGNOSIS: Same  PROCEDURE:  1. 4 compartment fasciotomy right lower extremity and placement of negative pressure dressing 2. Extension of 4 compartment fasciotomy left lower extremity and placement of negative pressure dressing 3. Ultrasound-guided placement of 23 cm tunneled dialysis catheter  SURGEON: Judeth Cornfield. Scot Dock, MD, FACS  ASSIST: none  ANESTHESIA: Gen.   EBL: minimal  INDICATIONS: Alfred Hall is a 81 y.o. male who underwent a fenestrated endovascular aneurysm repair yesterday. He was noted to have increased swelling in the right calf this morning with mottling of the lower extremities. In addition he had developed acute renal failure and was anuric. I recommended 4 compartment fasciotomy on the right leg and also extension of the fasciotomies in the left leg. In addition I recommended placement of a catheter for dialysis.  FINDINGS: There was extensive swelling in the anterior and lateral compartments of the right leg. There was moderate swelling in the deep and superficial posterior compartments on the right leg. I extended the fasciotomies on the left and there was moderate swelling. Muscle appeared well-perfused at the completion of the procedure.  TECHNIQUE:  The patient was taken to the operating room and received a general anesthetic. Of note his pressure by A-line was low however the cuff pressure was elevated. For this reason, anesthesia placed a new arterial line in the right arm. This confirmed the cuff pressure and therefore his pressors were weaned. His muscle perfusion looked much better on the left leg as the pressors were weaned. Both lower extremities were prepped and draped in usual sterile fashion.  On the right side, a longitudinal incision was made over the lateral  aspect of the right calf. Through this incision the fascia was opened over the anterior compartment and there was significant swelling in the muscle. This was extended proximally and distally. Next the soft tissue was retracted allowing exposure of the lateral compartment. The fascia was excised longitudinally over the lateral compartment and there was also significant swelling here. Hemostasis was obtained in these wounds.  Given that initially the muscle did not appear well perfused on the left eye are elected to extend the fasciotomies on the left leg. The lateral incision was extended distally and the muscle examined and appeared to be better perfused. There was moderate swelling. Likewise in the medial side the incision was extended and the deep compartment was entered by dividing the soleus muscle and fascia. There was moderate swelling here.  After hemostasis was obtained and all 4 wounds, a negative pressure dressing was placed over all 4 wounds. The more extensive wounds were the lateral wounds I did place  Mepitel over these wounds.  Next attention was turned to placement of a right IJ tunneled dialysis catheter. He had a central venous catheter on the left side. The right neck and upper chest were prepped and draped in usual sterile fashion. Under ultrasound guidance, the right IJ was cannulated and a guidewire introduced into the right atrium under fluoroscopic control. There was significantly elevated venous pressure. The tract over the wire was dilated and then the dilator and peel-away sheath were advanced over the wire and the wire and dilator removed. The 23 cm catheter was passed through the peel-away sheath and positioned in the right atrium. The exit site Was selected and the cath  was brought to the tunnel cut the appropriate length and the distal ports were attached. Both ports withdrew easily and flushed with saline. The catheter was secured at its exit site with a 3-0 nylon suture. The  IJ cannulation site was closed with 2 interrupted 3-0 nylon's for hemostasis. There was some oozing because of his coagulopathy. A sterile dressing was applied. The patient tolerated the procedure well and transferred back to the intensive care unit in critical condition. All needle and sponge counts were correct.  Deitra Mayo, MD, FACS [facility]  DATE OF DICTATION:   11/06/2014

## 2014-10-21 DIAGNOSIS — I469 Cardiac arrest, cause unspecified: Secondary | ICD-10-CM

## 2014-10-21 LAB — RENAL FUNCTION PANEL
ALBUMIN: 2.9 g/dL — AB (ref 3.5–5.0)
Anion gap: 22 — ABNORMAL HIGH (ref 5–15)
BUN: 13 mg/dL (ref 6–20)
CALCIUM: 6.8 mg/dL — AB (ref 8.9–10.3)
CO2: 14 mmol/L — AB (ref 22–32)
CREATININE: 2.17 mg/dL — AB (ref 0.61–1.24)
Chloride: 101 mmol/L (ref 101–111)
GFR calc Af Amer: 31 mL/min — ABNORMAL LOW (ref >60)
GFR calc non Af Amer: 26 mL/min — ABNORMAL LOW (ref >60)
GLUCOSE: 64 mg/dL — AB (ref 65–99)
PHOSPHORUS: 6.9 mg/dL — AB (ref 2.5–4.6)
Potassium: 6.2 mmol/L (ref 3.5–5.1)
SODIUM: 137 mmol/L (ref 135–145)

## 2014-10-21 LAB — DIC (DISSEMINATED INTRAVASCULAR COAGULATION)PANEL
D-Dimer, Quant: >20 ug/mL-FEU — ABNORMAL HIGH (ref 0.00–0.48)
Fibrinogen: 137 mg/dL — ABNORMAL LOW (ref 204–475)
INR: 2.4 — ABNORMAL HIGH (ref 0.00–1.49)
Prothrombin Time: 25.9 seconds — ABNORMAL HIGH (ref 11.6–15.2)
aPTT: 60 seconds — ABNORMAL HIGH (ref 24–37)

## 2014-10-21 LAB — BASIC METABOLIC PANEL
Anion gap: 21 — ABNORMAL HIGH (ref 5–15)
BUN: 10 mg/dL (ref 6–20)
CO2: 13 mmol/L — ABNORMAL LOW (ref 22–32)
CREATININE: 1.85 mg/dL — AB (ref 0.61–1.24)
Calcium: 6.6 mg/dL — ABNORMAL LOW (ref 8.9–10.3)
Chloride: 102 mmol/L (ref 101–111)
GFR calc Af Amer: 37 mL/min — ABNORMAL LOW (ref >60)
GFR, EST NON AFRICAN AMERICAN: 32 mL/min — AB (ref >60)
Glucose, Bld: 122 mg/dL — ABNORMAL HIGH (ref 65–99)
Potassium: 6.5 mmol/L (ref 3.5–5.1)
SODIUM: 136 mmol/L (ref 135–145)

## 2014-10-21 LAB — DIC (DISSEMINATED INTRAVASCULAR COAGULATION) PANEL
PLATELETS: 72 10*3/uL — AB (ref 150–400)
SMEAR REVIEW: NONE SEEN

## 2014-10-21 LAB — POCT I-STAT 3, ART BLOOD GAS (G3+)
ACID-BASE DEFICIT: 14 mmol/L — AB (ref 0.0–2.0)
ACID-BASE DEFICIT: 13 mmol/L — AB (ref 0.0–2.0)
ACID-BASE DEFICIT: 11 mmol/L — AB (ref 0.0–2.0)
Bicarbonate: 13.9 mEq/L — ABNORMAL LOW (ref 20.0–24.0)
Bicarbonate: 14.9 mEq/L — ABNORMAL LOW (ref 20.0–24.0)
Bicarbonate: 15.6 mEq/L — ABNORMAL LOW (ref 20.0–24.0)
O2 SAT: 86 %
O2 SAT: 91 %
O2 Saturation: 85 %
PH ART: 7.169 — AB (ref 7.350–7.450)
PH ART: 7.206 — AB (ref 7.350–7.450)
PO2 ART: 59 mmHg — AB (ref 80.0–100.0)
Patient temperature: 97.4
Patient temperature: 94.1
TCO2: 15 mmol/L (ref 0–100)
TCO2: 16 mmol/L (ref 0–100)
TCO2: 17 mmol/L (ref 0–100)
pCO2 arterial: 37.9 mmHg (ref 35.0–45.0)
pCO2 arterial: 36.5 mmHg (ref 35.0–45.0)
pCO2 arterial: 32.6 mmHg — ABNORMAL LOW (ref 35.0–45.0)
pH, Arterial: 7.273 — ABNORMAL LOW (ref 7.350–7.450)
pO2, Arterial: 62 mmHg — ABNORMAL LOW (ref 80.0–100.0)
pO2, Arterial: 52 mmHg — ABNORMAL LOW (ref 80.0–100.0)

## 2014-10-21 LAB — GLUCOSE, CAPILLARY
GLUCOSE-CAPILLARY: 113 mg/dL — AB (ref 65–99)
GLUCOSE-CAPILLARY: 64 mg/dL — AB (ref 65–99)
GLUCOSE-CAPILLARY: 77 mg/dL (ref 65–99)
Glucose-Capillary: 122 mg/dL — ABNORMAL HIGH (ref 65–99)

## 2014-10-21 LAB — PREPARE FRESH FROZEN PLASMA
UNIT DIVISION: 0
Unit division: 0

## 2014-10-21 LAB — CBC
HCT: 31.4 % — ABNORMAL LOW (ref 39.0–52.0)
Hemoglobin: 9.8 g/dL — ABNORMAL LOW (ref 13.0–17.0)
MCH: 27.3 pg (ref 26.0–34.0)
MCHC: 31.2 g/dL (ref 30.0–36.0)
MCV: 87.5 fL (ref 78.0–100.0)
PLATELETS: 76 10*3/uL — AB (ref 150–400)
RBC: 3.59 MIL/uL — AB (ref 4.22–5.81)
RDW: 19 % — ABNORMAL HIGH (ref 11.5–15.5)
WBC: 5.7 10*3/uL (ref 4.0–10.5)

## 2014-10-21 LAB — PREPARE PLATELET PHERESIS: UNIT DIVISION: 0

## 2014-10-21 LAB — MAGNESIUM: MAGNESIUM: 2.3 mg/dL (ref 1.7–2.4)

## 2014-10-21 MED ORDER — INSULIN ASPART 100 UNIT/ML IV SOLN
10.0000 [IU] | Freq: Once | INTRAVENOUS | Status: AC
  Administered 2014-10-21: 10 [IU] via INTRAVENOUS

## 2014-10-21 MED ORDER — DEXTROSE 50 % IV SOLN
INTRAVENOUS | Status: AC
  Filled 2014-10-21: qty 50

## 2014-10-21 MED ORDER — SODIUM BICARBONATE 8.4 % IV SOLN
INTRAVENOUS | Status: DC
  Administered 2014-10-21: 05:00:00 via INTRAVENOUS
  Filled 2014-10-21 (×3): qty 150

## 2014-10-21 MED ORDER — DOPAMINE-DEXTROSE 3.2-5 MG/ML-% IV SOLN
0.0000 ug/kg/min | INTRAVENOUS | Status: DC
  Administered 2014-10-21: 5 ug/kg/min via INTRAVENOUS
  Filled 2014-10-21: qty 250

## 2014-10-21 MED ORDER — CALCIUM GLUCONATE 10 % IV SOLN
1.0000 g | Freq: Once | INTRAVENOUS | Status: DC
Start: 2014-10-21 — End: 2014-10-21

## 2014-10-21 MED ORDER — DEXTROSE 50 % IV SOLN
50.0000 mL | Freq: Once | INTRAVENOUS | Status: AC
  Administered 2014-10-21: 50 mL via INTRAVENOUS
  Filled 2014-10-21: qty 50

## 2014-10-21 MED ORDER — SODIUM BICARBONATE 8.4 % IV SOLN
50.0000 meq | Freq: Once | INTRAVENOUS | Status: AC
  Administered 2014-10-21: 50 meq via INTRAVENOUS

## 2014-10-21 MED ORDER — DEXTROSE 50 % IV SOLN
25.0000 mL | Freq: Once | INTRAVENOUS | Status: AC
  Administered 2014-10-21: 25 mL via INTRAVENOUS

## 2014-10-21 MED ORDER — SODIUM CHLORIDE 0.9 % IV SOLN
1.0000 g | Freq: Once | INTRAVENOUS | Status: AC
  Administered 2014-10-21: 1 g via INTRAVENOUS
  Filled 2014-10-21: qty 10

## 2014-10-22 LAB — TYPE AND SCREEN
ABO/RH(D): A POS
Antibody Screen: NEGATIVE
UNIT DIVISION: 0
UNIT DIVISION: 0
UNIT DIVISION: 0
Unit division: 0
Unit division: 0
Unit division: 0

## 2014-10-22 LAB — PREPARE FRESH FROZEN PLASMA
UNIT DIVISION: 0
UNIT DIVISION: 0
UNIT DIVISION: 0
Unit division: 0
Unit division: 0
Unit division: 0

## 2014-10-22 LAB — POCT I-STAT, CHEM 8
BUN: 12 mg/dL (ref 6–20)
CHLORIDE: 108 mmol/L (ref 101–111)
Calcium, Ion: 0.82 mmol/L — ABNORMAL LOW (ref 1.13–1.30)
Creatinine, Ser: 1.7 mg/dL — ABNORMAL HIGH (ref 0.61–1.24)
Glucose, Bld: 122 mg/dL — ABNORMAL HIGH (ref 65–99)
HCT: 25 % — ABNORMAL LOW (ref 39.0–52.0)
Hemoglobin: 8.5 g/dL — ABNORMAL LOW (ref 13.0–17.0)
Potassium: 6 mmol/L — ABNORMAL HIGH (ref 3.5–5.1)
SODIUM: 133 mmol/L — AB (ref 135–145)
TCO2: 15 mmol/L (ref 0–100)

## 2014-10-23 ENCOUNTER — Encounter (HOSPITAL_COMMUNITY): Payer: Self-pay | Admitting: Surgery

## 2014-10-23 MED FILL — Medication: Qty: 1 | Status: AC

## 2014-10-24 ENCOUNTER — Encounter (HOSPITAL_COMMUNITY): Payer: Self-pay | Admitting: Surgery

## 2014-10-25 ENCOUNTER — Encounter (HOSPITAL_COMMUNITY): Payer: Self-pay | Admitting: Surgery

## 2014-11-17 NOTE — Progress Notes (Signed)
eLink Physician-Brief Progress Note Patient Name: Alfred Hall DOB: 11-Oct-1929 MRN: [id number]   Date of Service  2014-11-03  HPI/Events of Note  Persistent acidosis on CRRT  eICU Interventions  Resume bicarb gtt until pH 7.25 & better     Intervention Category Major Interventions: Acid-Base disturbance - evaluation and management  ALVA,RAKESH V. 11-03-14, 4:46 AM

## 2014-11-17 NOTE — Progress Notes (Signed)
eLink Physician-Brief Progress Note Patient Name: Alfred Hall DOB: [birth year] MRN: [id number]   Date of Service  11-01-14  HPI/Events of Note  Loletha Grayer , K 6.2  eICU Interventions  Bicarb, calcium x1, d50/insulin Rpt BMET & ABG after CRRT to continue     Intervention Category Major Interventions: Acute renal failure - evaluation and management;Electrolyte abnormality - evaluation and management  ALVA,RAKESH V. 11/01/2014, 6:57 AM

## 2014-11-17 NOTE — Progress Notes (Signed)
CRITICAL VALUE ALERT  Critical value received:  K 6.5  Date of notification:  10/23/14  Time of notification:   Critical value read back: yes  Nurse who received alert:  Josph Macho  MD notified (1st page):  MD Marylou Flesher  Time of first page:  0950  MD notified (2nd page):  Time of second page:  Responding MD:  MD Joelyn Oms  Time MD responded:  [phone]

## 2014-11-17 NOTE — Progress Notes (Signed)
Notified MD of patients current HR ranging from 41-23 sinus brady and current gtts infusing, and of chem 8 results of K 6.0. BMET pending status post insulin, calcium gluconate, and bicarb push. No new orders received at this time. Will continue to monitor the patient closely.

## 2014-11-17 NOTE — Procedures (Signed)
Admit: 11/16/2014 LOS: 3  89M w/ refractory shock, VDRF, anuric AKI, met/resp acidosis, rhabdomyolysis, b/l LE compartment syndrome s/p AAA endovascular repair 9/2.   Current CRRT Prescription: Start Date: 9/3 Catheter: R IJ TDC placed 9/3 BFR: 150 Pre Blood Pump: 1000 4K DFR: 2000 4K Replacement Rate: 1000 4K Goal UF: Net Even as able Anticoagulation: none in system Clotting: at least qShift    S: Less pressor Req Some problem with TDC and filter clotted o/n Now in DIC No UOP K and HCO3 worsened  O: 09/03 0701 - 09/04 0700 In: 7968 [I.V.:3690; YYTKP:5465; IV Piggyback:700] Out: 3328 [Emesis/NG output:150; Drains:700; Blood:50]  Filed Weights   11/01/2014 1147 02-Nov-2014 0500  Weight: 71.668 kg (158 lb) 87.8 kg (193 lb 9 oz)     Recent Labs Lab 10/27/2014 0523  10/26/2014 1042 10/31/2014 1555 11-02-2014 0504  NA 140  < > 136 140 137  K 5.4*  < > 6.3* 5.4* 6.2*  CL 103  --   --  105 101  CO2 13*  --   --  13* 14*  GLUCOSE 304*  --   --  231* 64*  BUN 26*  --   --  17 13  CREATININE 3.15*  --   --  2.36* 2.17*  CALCIUM 6.5*  --   --  6.5* 6.8*  PHOS 9.3*  --   --  6.9* 6.9*  < > = values in this interval not displayed.  Recent Labs Lab 11/01/2014 0523  10/26/2014 1042 11/09/2014 1845 11-02-2014 0503 Nov 02, 2014 0504  WBC 23.0*  --   --  12.3*  --  5.7  HGB 10.6*  < > 8.8* 12.9*  --  9.8*  HCT 33.9*  < > 26.0* 40.2  --  31.4*  MCV 84.5  --   --  87.2  --  87.5  PLT 90*  --   --  32* 72* 76*  < > = values in this interval not displayed.  Scheduled Meds: . antiseptic oral rinse  7 mL Mouth Rinse QID  . calcium gluconate  1 g Intravenous Once  . chlorhexidine gluconate  15 mL Mouth Rinse BID  . enoxaparin (LOVENOX) injection  30 mg Subcutaneous Q24H  . famotidine (PEPCID) IV  20 mg Intravenous Q24H  . fentaNYL (SUBLIMAZE) injection  50 mcg Intravenous Once  . Influenza vac split quadrivalent PF  0.5 mL Intramuscular Tomorrow-1000  . insulin aspart  0-9 Units Subcutaneous  6 times per day  . piperacillin-tazobactam  3.375 g Intravenous 4 times per day  . vancomycin  750 mg Intravenous Q24H   Continuous Infusions: . sodium chloride 75 mL/hr at 11/09/2014 2135  . fentaNYL infusion INTRAVENOUS 100 mcg/hr (2014/11/02 0700)  . norepinephrine (LEVOPHED) Adult infusion Stopped (11/02/2014 0700)  . phenylephrine (NEO-SYNEPHRINE) Adult infusion Stopped (11/02/2014 0919)  . dialysis replacement fluid (prismasate) 1,000 mL/hr at 11-02-14 0400  . dialysis replacement fluid (prismasate) 1,000 mL/hr at 2014-11-02 0405  . dialysate (PRISMASATE) 2,000 mL/hr at 2014-11-02 0405  .  sodium bicarbonate  infusion 1000 mL 75 mL/hr at 11/02/14 0700  . vasopressin (PITRESSIN) infusion - *FOR SHOCK* 0.03 Units/min (2014/11/02 0700)   PRN Meds:.albuterol, bisacodyl, fentaNYL, guaiFENesin-dextromethorphan, heparin, magnesium sulfate 1 - 4 g bolus IVPB, ondansetron, phenol, polyethylene glycol, potassium chloride, sodium chloride  ABG    Component Value Date/Time   PHART 7.169* Nov 02, 2014 0431   PCO2ART 37.9 11-02-14 0431   PO2ART 62.0* 02-Nov-2014 0431   HCO3 13.9* November 02, 2014 0431  TCO2 15 11/12/14 0431   ACIDBASEDEF 14.0* 12-Nov-2014 0431   O2SAT 86.0 12-Nov-2014 0431    A/P  1. Anuric AoCKD 2/2 Rhabdomyoysis and contrast 1. BL SCr 1.5-2.0 2. CRRT, aggressive settings to improve metabolic issues 1. In Pre and Dialysis fluids today 2. Discuss with VVS if can put heparin in circuit to help with clotting 3. Cont to attempt Net even UF  2. Rhabdomyolysis 2/2 compartment syndrome after AAA endovascular repair 1. For R calf fasciotomy 9/3 2. S/p L calf fasciotomy 9/2 3. Rec alkali therapy, no UOP 3. S/p AAA endovascular repair 9/2 4. VDRF 5. Shock 6. Hyperkalemia 2/2 #2 and #1 7. Hyperphosphatemia 2/2 #2 8. PVD 9. T2N0 Lung Cancer 10. Hx/o HTN   Pearson Grippe, MD [employer] pgr [phone]

## 2014-11-17 NOTE — Progress Notes (Signed)
   VASCULAR SURGERY ASSESSMENT & PLAN:  * 2 Day Post-Op s/p: Fenestrated Endovascular Aneurysm Repair and left leg fasciotomy  * 1 Day post op: Right leg fasciotomy and Diatek  * Cardiac: Pressure better. On Levo (being weened) , and Vasopressin  * Pulmonary: good oxygenation  * Renal: On CVVH. No urine output. crt = 2.17 (improved)  * Hyperkalemia: renal managing CVVH  * On bicarb drip for acidosis  * Thrombocytopenia: No heparin. PLT = 76 K (improved). Will send HIT panel. Renal wants to use heparin for CVVH to keep circuit from clotting.   * GI/ Nutrition: NGT 150 cc/ 12 hours.   * Coagulopathic. INR = 2.4 (improved).    SUBJECTIVE: Sedated on vent  PHYSICAL EXAM: Filed Vitals:   November 08, 2014 0445 2014/11/08 0500 08-Nov-2014 0600 08-Nov-2014 0700  BP:  148/60 127/52 124/61  Pulse: 59 61 57 92  Temp:      TempSrc:      Resp: '24 24 16 24  '$ Height:      Weight:  193 lb 9 oz (87.8 kg)    SpO2: 94% 93% 95% 88%   Lungs; good air exchange Abdomen: a little distended. Hypoactive BS. When comes off sedation, does not seem tender Good PT signals both feet All VAC sites with a good seal.  LABS: Lab Results  Component Value Date   WBC 5.7 11-08-14   HGB 9.8* 2014-11-08   HCT 31.4* 2014-11-08   MCV 87.5 11-08-2014   PLT 76* 2014-11-08   Lab Results  Component Value Date   CREATININE 2.17* 2014/11/08   Lab Results  Component Value Date   INR 2.40* November 08, 2014   CBG (last 3)   Recent Labs  10/24/2014 1930 11/08/2014 0002 08-Nov-2014 0429  GLUCAP 164* 113* 64*    Active Problems:   Abdominal aortic aneurysm without rupture   Shock circulatory   Acute respiratory failure with hypoxia   [name] Beeper: [phone]

## 2014-11-17 NOTE — Progress Notes (Signed)
CRITICAL VALUE ALERT  Critical value received:  K 6.2  Date of notification:  Nov 19, 2014  Time of notification:  0611  Critical value read back:Yes.    Nurse who received alert:  Vista Lawman, RN  MD notified (1st page):  Dr. Elsworth Soho  Time of first page:  [phone], called to elink

## 2014-11-17 NOTE — Procedures (Signed)
CPR Note  Patient became bradycardic and asystole.  CPR, defibrillation and epi/atropine performed.  Please see flow sheet for information.  We were unable to establish ROSC.  Patient declared at 13:23 and contacted daughter Al [place] who was informed.  Rush Farmer, M.D. [facility] Pulmonary/Critical Care Medicine. Pager: [phone]. After hours pager: [phone].

## 2014-11-17 NOTE — Progress Notes (Signed)
PULMONARY / CRITICAL CARE MEDICINE   Name:Kivon DERICK SEMINARA IWO:[id number] DOB:[birth year]   ADMISSION DATE: 11/08/2014 CONSULTATION DATE: 01/18/2015  REFERRING MD : Dr. Trula Slade, vascular surgery  CHIEF COMPLAINT: Postoperative medical management and ventilator management  INITIAL PRESENTATION:  81 year old male with a history of hypertension and peripheral vascular disease was admitted on 11/11/2014. He had an endovascular repair of an abdominal aortic aneurysm on 11/07/2014. This procedure was complicated by left leg ischemia requiring a fasciotomy for compartment syndrome.  STUDIES:    SIGNIFICANT EVENTS: 11/15/2014 endovascular repair of juxtarenal aortic aneurysm 11/03/2014 emergent left leg fasciotomy due to leg ischemia and reperfusion injury 9/3 shock overnight. Persistently acidotic. Underwent Right leg fascitomy and placement of HD cath, CRRT started.  9/4 remains in refractory shock and severe metabolic acidosis w/ hyperkalemia in spite of CRRT.   SUBJECTIVE: Sedated on vent  VITAL SIGNS: Temp:  [93.8 F (34.3 C)-98.8 F (37.1 C)] 94 F (34.4 C) (09/04 0800) Pulse Rate:  [45-92] 47 (09/04 1000) Resp:  [0-32] 24 (09/04 1000) BP: (91-148)/(39-98) 114/48 mmHg (09/04 1000) SpO2:  [71 %-97 %] 94 % (09/04 1000) Arterial Line BP: (104-163)/(34-47) 121/40 mmHg (09/04 1000) FiO2 (%):  [50 %-60 %] 60 % (09/04 1000) Weight:  [87.8 kg (193 lb 9 oz)] 87.8 kg (193 lb 9 oz) (09/04 0500)  . sodium chloride 75 mL/hr at 11/02/2014 2135  . fentaNYL infusion INTRAVENOUS 100 mcg/hr (11/09/2014 1000)  . norepinephrine (LEVOPHED) Adult infusion 4 mcg/min (2014-11-09 1000)  . phenylephrine (NEO-SYNEPHRINE) Adult infusion Stopped (11/08/2014 0919)  . dialysis replacement fluid (prismasate) 1,500 mL/hr at Nov 09, 2014 1036  . dialysis replacement fluid (prismasate) 1,000 mL/hr at November 09, 2014 0405  . dialysate (PRISMASATE) 2,500 mL/hr at 11-09-2014 0900  .  sodium bicarbonate  infusion  1000 mL 75 mL/hr at 11-09-2014 1000  . vasopressin (PITRESSIN) infusion - *FOR SHOCK* 0.03 Units/min (11-09-2014 0700)    HEMODYNAMICS: CVP:  [7 mmHg-10 mmHg] 10 mmHg VENTILATOR SETTINGS: Vent Mode:  [-] PRVC FiO2 (%):  [50 %-60 %] 60 % Set Rate:  [24 bmp] 24 bmp Vt Set:  [500 mL-550 mL] 550 mL PEEP:  [5 cmH20] 5 cmH20 Plateau Pressure:  [22 cmH20-25 cmH20] 25 cmH20 INTAKE / OUTPUT:  Intake/Output Summary (Last 24 hours) at 09-Nov-2014 1051 Last data filed at 11-09-14 1000  Gross per 24 hour  Intake 5739.23 ml  Output   3606 ml  Net 2133.23 ml    PHYSICAL EXAMINATION: General:  Chronically ill elderly appearing male. Mottled extensively  Neuro:  Sedated.  HEENT:  Universal/AT, pupil sluggish but reactive, no EOM and MMM. Orally intubated  Cardiovascular:  RRR, Nl S1/S2, -M/R/G. Lungs:  Coarse BS diffusely. Abdomen:  Soft, NT, ND and no BS. Musculoskeletal:  Cyanotic bilateral feet.mottled extremities and abd  Skin:  Surgical sites noted, no puss, cyanotic feet remain   LABS:  CBC  Recent Labs Lab 11/01/2014 0523  11/12/2014 1042 11/12/2014 1845 2014/11/09 0503 Nov 09, 2014 0504  WBC 23.0*  --   --  12.3*  --  5.7  HGB 10.6*  < > 8.8* 12.9*  --  9.8*  HCT 33.9*  < > 26.0* 40.2  --  31.4*  PLT 90*  --   --  32* 72* 76*  < > = values in this interval not displayed. Coag's  Recent Labs Lab 11/01/2014 1539 10/23/2014 0524 10/22/2014 1845 11/09/2014 0503  APTT 86*  --  109* 60*  INR 2.30* 4.92* 6.92* 2.40*   BMET  Recent Labs Lab 11/09/2014 1555  11/11/14 0504 11-11-2014 0850  NA 140 137 136  K 5.4* 6.2* 6.5*  CL 105 101 102  CO2 13* 14* 13*  BUN '17 13 10  '$ CREATININE 2.36* 2.17* 1.85*  GLUCOSE 231* 64* 122*   Electrolytes  Recent Labs Lab 10/22/2014 1539  11/03/2014 0523 10/31/2014 1555 11/11/14 0504 11-11-2014 0850  CALCIUM 7.2*  < > 6.5* 6.5* 6.8* 6.6*  MG 2.3  --  2.3  --  2.3  --   PHOS  --   --  9.3* 6.9* 6.9*  --   < > = values in this interval not displayed. Sepsis  Markers  Recent Labs Lab 10/27/2014 0523  LATICACIDVEN 17.1*   ABG  Recent Labs Lab 11/05/2014 1151 11/11/2014 0431 Nov 11, 2014 0843  PHART 7.109* 7.169* 7.206*  PCO2ART 42.3 37.9 36.5  PO2ART 289.0* 62.0* 52.0*   Liver Enzymes  Recent Labs Lab 11/01/2014 1514 10/31/2014 0524 11/14/2014 1555 11/11/2014 0504  AST 29 >10000*  --   --   ALT 25 7430*  --   --   ALKPHOS 54 125  --   --   BILITOT 0.6 1.2  --   --   ALBUMIN 3.4* 2.0* 2.5* 2.9*   Cardiac Enzymes  Recent Labs Lab 11/03/2014 0524  TROPONINI 0.20*   Glucose  Recent Labs Lab 11/14/2014 0817 11/13/2014 1558 11/05/2014 1930 11-Nov-2014 0002 Nov 11, 2014 0429 Nov 11, 2014 0507  GLUCAP 339* 218* 164* 113* 64* 122*    Imaging Dg Chest Port 1 View  11/06/2014   CLINICAL DATA:  Postop dialysis catheter placement. History of lung cancer and radiation therapy  EXAM: PORTABLE CHEST - 1 VIEW  COMPARISON:  10/28/2014; 11/10/2014 ; chest CT - 08/03/2014  FINDINGS: Interval placement of a right jugular approach dialysis catheter with tips projected over the distal SVC. Otherwise, stable position of support apparatus. No pneumothorax. Unchanged cardiac silhouette and mediastinal contours post aortic valve replacement. Unchanged left-sided volume loss with extensive left-sided coarse reticular opacities throughout the left lung. There is mild slightly nodular interstitial thickening within the contralateral right lung. Known right upper lobe pulmonary nodule and adjacent atelectasis appears grossly unchanged. No new focal airspace opacities. Unchanged small layering left-sided pleural effusion. No definite evidence of edema. Unchanged bones.  IMPRESSION: 1. Interval placement of right jugular approach dialysis catheter with tips projected over the distal SVC. Otherwise, stable positioning of support apparatus. No pneumothorax. 2. Similar findings of suspected mild pulmonary edema superimposed on advanced emphysematous change. 3. Known left upper lobe pulmonary  nodule/mass is atelectasis as well as extensive interstitial thickening within the left lung appears grossly unchanged compatible with evolving radiation change.   Electronically Signed   By: Sandi Mariscal M.D.   On: 11/11/2014 12:22   Dg Fluoro Guide Cv Line-no Report  11/09/2014   CLINICAL DATA:    FLOURO GUIDE CV LINE  Fluoroscopy was utilized by the requesting physician.  No radiographic  interpretation.    CXR stable. W/ persistent lul changes r/t his cancer. Support tubes in place.   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 Most likely this is due to his acidosis.History of hypertension Aortic stenosis, status post bioprosthetic valve LVEF 45% on August 2016 echocardiogram P:  MAP goal > 65 Even vol status Correct acidosis  Hold amidarone w/ bradycardia  Correct K  RENAL A:  Acute on chronic renal insufficiency in setting of hypotension, most likely prerenal Severe metabolic acidosis. Lactate 17.1. Most likely a complication lower limb ischemia Hyperkalemia   P:  Ensure euvolemia  Start CRRT (nephrology following) Serial labs Strict I&O  GASTROINTESTINAL A: Shock liver P:  Pepcid for stress ulcer prophylaxis F/u lfts am  Nothing by mouth for now, consider tube feeds next 24 hrs if hemodynamics improve Dc lipitor  HEMATOLOGIC A:  Mild anemia postoperatively, no evidence of bleeding at this time thrombocytopenia  coagulopathy P:  Monitor for bleeding Daily CBC F/u cbc may need to hold LMWH  INFECTIOUS A:  Persistent shock/ likely ischemic leg. Surgical report not mentioning concern for infection but given clinical decline sepsis on ddx   Abx: Perioperative cefuroxime 11/13/2014 P BCX2 9/3>>> vanc 9/3>>> Zosyn 9/3>>>  ENDOCRINE A: hyperglycemia  P:  Monitor glucose ssi protocol   NEUROLOGIC A: Sedation needs for vent synchrony P:  RASS goal: -2 PAD protocol    FAMILY  - Updates: No family bedside.  2014/10/31, 10:51 AM  Critically ill. Most likely all from refractory acidosis from ischemic legs. Nothing further to add. Priority is his metabolic status today and correcting hyperkalemia. He is on bicarb gtt and CRRT for this. I do not think he will survive. Need to start talking about goals of care and resuscitation end-points as there is not much to add if he arrests.   Erick Colace ACNP-BC [facility] Pulmonary/Critical Care Pager # [phone] OR # [phone] if no answer  Attending Note:  81 year old vasculopath who underwent a AAA repair who underwent surgical repair. Back to the OR this AM since his legs were mottled. The patient was taken to the OR and a fasciotomy was done. Hypotensive and bradycardic remain an issue, will start dopamine drip and watch for arrhythmias. Noted to be acidotic but that is improving. Vent adjusted. Now aneuric as well and in metabolic acidosis. Continue CRRT and keep even. Stabilized with vent adjustment. Will continue to follow.  The patient is critically ill with multiple organ systems failure and requires high complexity decision making for assessment and support, frequent evaluation and titration of therapies, application of advanced monitoring technologies and extensive interpretation of multiple databases.   Critical Care Time devoted to patient care services described in this note is 35 Minutes. This time reflects time of care of this signee Dr Jennet Maduro. This critical care time does not reflect procedure time, or teaching time or supervisory time of PA/NP/Med student/Med Resident etc but could involve care discussion time.  Rush Farmer, M.D. [facility]  Pulmonary/Critical Care Medicine. Pager: [phone]. After hours pager: [phone].

## 2014-11-17 NOTE — Discharge Summary (Signed)
[facility] Death Summary   PHUC KLUTTZ 11/15/1929 81 y.o. male  [id number]  Admission Date: 10/30/14  Date of Death: 11-02-14   Physician: Annamarie Major, MD  Admission Diagnosis: Juxtarenal abdominal aortic aneurysm I71.4 fenestrated stent graft compartment syndrome bilat legs  HPI:   This is a 81 y.o. male who was being evaluated for a fenestrated endovascular aneurysm repair when he became formally diagnosed with lung cancer. He has recently completed radiation treatment. He is here today to discuss aneurysm repair. He has a history of an ascending aortic aneurysm repair with valve replacement and CABG November 2015. He suffers from hypercholesterolemia which was managed with a statin. He is a former smoker  He appears to have tolerated his radiation therapy very well. He does have occasional weakness but no significant pain or shortness of breath.  The patient was scheduled to have a PET scan to evaluate for residual versus new disease. Once completed, if it is negative, Dr Trula Slade repeated his CT angiogram of the chest abdomen and pelvis in order to size his graft. He was seen by cardiology for pre-operative clearance. He underwent 2D ECHO to reassess LV function. Per cardiology notes from Ermalinda Barrios, PA-C :  Dr. Acie Fredrickson reviewed the echo and this is what he wrote :"The echo is basically unchanged from previous echo . There is nothing on this that would make me alter your pre-op assessment from the office visit."   Patient at increased risk of surgery because of age, COPD, lung CA. Cardiac status has been stable. Please see office note for details.       Dr. Trula Slade went over the risks and benefits of the operation and the patient was willing to proceed.   Hospital Course:  The patient was admitted to the hospital on 11/03/2014 for IV hydration in anticipation of FEVAR on 11/09/14 given renal insufficiency.   The patient was taken to the  operating room on 11/06/2014 and underwent: #1: Fenestrated endovascular repair of juxtarenal abdominal aortic aneurysm #2: Stent, left renal artery #3: Stent, right renal artery #4: Selective left renal angiogram #5: Selective right renal angiogram #6: Abdominal angiogram #7: Angioplasty, left external iliac artery #8: Distal extension 1  Findings:  The patient had heavy calcification within his iliac arteries bilaterally which made insertion of bilateral sheaths challenging. He had a pre-existing left external iliac artery stent which had to be dilated in order to get a 33 French sheath into the aorta from the left. It was very difficult to manipulate his graft inside the aorta because of the significant calcification. He had excellent Doppler signals in bilateral lower extremities following the procedure. Upon sheath removal, the patient became hypotensive. Dr. Trula Slade performed angiography while removing the sheaths to make sure that there was no arterial injury. This was presumed to be washout from the sheaths given that they were likely occlusive because of the stenosis or narrowing within his iliac arteries and the duration of the case.  The patient did have doppler signals in the bilateral dorsalis pedis arteries at the end of the case. The patient was hypotensive at the end of the case and difficulty with sheath insertion. Therefore a table CT scan was done at the end of the case to evaluate for retroperitoneal hematoma.   The patient was transferred to the PACU in stable condition. He was extubated in the OR but was subsequently placed back on the vent in the PACU. The left calf was concerning for compartment syndrome  and Dr. Trula Slade performed an emergent bedside fasciotomy of the the left lower extremity due to leg ischemia and reperfusion injury.  The patient had hyperkalemia of 7.0 in the PACU and was acidotic on ABG. Critical care was consulted for post-operative medical management  and ventilator management. Overnight the patient was in shock and persistently acidotic. He was placed on Neo, Levo and Vasopressin. Plans were made to start CRRT. He was on a bicarb drip for acidosis. Nephrology was consulted for AKI, hyperkalemia, metabolic acidosis, shock and rhabdomyolysis.   POD 1: He had increased swelling in his right calf and was taken to the operating room urgently for fasciotomy. His INR was 4.92 and receiving vitamin K  He was taken to the operating room on 11/06/2014 and underwent emergent right lower extremity fasciotomy and insertion of tunneled dialysis catheter. He was started on CRRT 9/3.   POD 2: He remained in refractory shock, aneuric and severe metabolic acidosis with hyperkalemia despite CRRT and bicarb gtt. There was no family at the bedside. Plans were made to discuss goals of care with his family.   The patient became bradycardic and asystole. CPR, defibrillation, epi/atropine performed. Unable to establish ROSC and the patient was declared at 13:23 on 11-04-14. His daughter was informed.   CBC    Component Value Date/Time   WBC 5.7 2014/11/04 0504   WBC 7.7 09/12/2013 1648   RBC 3.59* 11-04-14 0504   RBC 4.86 09/12/2013 1648   HGB 8.5* 11/04/14 0845   HCT 25.0* 11-04-14 0845   PLT 76* 11-04-14 0504   MCV 87.5 2014-11-04 0504   MCH 27.3 Nov 04, 2014 0504   MCH 29.4 09/12/2013 1648   MCHC 31.2 11-04-14 0504   MCHC 34.2 09/12/2013 1648   RDW 19.0* 11-04-2014 0504   RDW 14.8 09/12/2013 1648   LYMPHSABS 1.6 09/12/2013 1648   EOSABS 0.2 09/12/2013 1648   BASOSABS 0.0 09/12/2013 1648    BMET    Component Value Date/Time   NA 136 November 04, 2014 0850   NA 138 09/12/2013 1648   K 6.5* 11/04/2014 0850   CL 102 11-04-14 0850   CO2 13* 11-04-2014 0850   GLUCOSE 122* 11/04/14 0850   GLUCOSE 83 09/12/2013 1648   BUN 10 November 04, 2014 0850   BUN 26 09/12/2013 1648   CREATININE 1.85* 11/04/14 0850   CALCIUM 6.6* 11-04-14 0850   GFRNONAA 32*  November 04, 2014 0850   GFRAA 37* 11-04-14 0850     Discharge Diagnosis:  Juxtarenal abdominal aortic aneurysm I71.4 fenestrated stent graft compartment syndrome bilat legs  Secondary Diagnosis: Patient Active Problem List   Diagnosis Date Noted  . Shock circulatory 11/02/2014  . Acute respiratory failure with hypoxia 11/07/2014  . Preoperative cardiovascular examination 10/08/2014  . Paroxysmal atrial fibrillation 10/08/2014  . Bronchogenic cancer of left lung 05/18/2014  . Lung nodule   . S/P AVR 01/24/2014  . H/O aortic aneurysm repair 01/24/2014  . COPD (chronic obstructive pulmonary disease) 01/24/2014  . Chronic renal insufficiency 01/24/2014  . Abdominal aortic aneurysm without rupture 11/20/2013  . CAD (coronary artery disease)   . Tobacco abuse   . Aortic stenosis   . Lung cancer, left upper lobe   . Atrial fibrillation 09/18/2013  . Hypercholesteremia   . Hypertension   . Aortic aneurysm, abdominal   . PVD (peripheral vascular disease)   . Aortic stenosis, severe   . Thoracic aortic aneurysm without rupture   . Atherosclerosis of native artery of extremity 09/16/2011   Past Medical History  Diagnosis Date  . PVD (peripheral vascular disease)   . Microhematuria     a. workup negative  . Aortic aneurysm, abdominal   . Hypercholesteremia   . Hypertension   . Thoracic aortic aneurysm without rupture   . Aortic stenosis     a. ECHO at [place]- pk AV grad 77 with a mean rate of 53   . Tobacco abuse   . CAD (coronary artery disease)     a. s/p LHC on 09/18/13 with non-obs disease and severe AS  . COPD (chronic obstructive pulmonary disease)   . Hypoglycemia   . Abdominal aortic aneurysm   . Wears glasses   . Lung cancer   . Radiation     treatment for lung  . History of blood transfusion     "don't remember why"  . Gout     "have had it in my feet & in my right hand"       Medication List    ASK your doctor about these medications        amiodarone 200 MG  tablet  Commonly known as:  PACERONE  TAKE 1 TABLET BY MOUTH EVERY DAY     aspirin EC 81 MG tablet  Take 1 tablet (81 mg total) by mouth daily.     atorvastatin 20 MG tablet  Commonly known as:  LIPITOR  Take 1 tablet (20 mg total) by mouth daily at 6 PM.     CENTRUM SILVER ADULT 50+ PO  Take 1 tablet by mouth daily.     cholecalciferol 1000 UNITS tablet  Commonly known as:  VITAMIN D  Take 1,000 Units by mouth daily.     Garlic 664 MG Caps  Take 300 mg by mouth daily.     metoprolol tartrate 25 MG tablet  Commonly known as:  LOPRESSOR  Take 0.5 tablets (12.5 mg total) by mouth 2 (two) times daily.        Patient's condition: is deceased   Virgina Jock, [facility] [phone] 10/23/2014  8:10 AM   - For VQI Registry use --- Instructions: Press F2 to tab through selections.  Delete question if not applicable.   Post-op:  Time to Extubation: [ x] In OR, '[ ]'$  < 12 hrs, '[ ]'$  12-24 hrs, '[]'$  >=24 hrs Vasopressors Req. Post-op: Yes MI: No., '[ ]'$  Troponin only, '[ ]'$  EKG or Clinical New Arrhythmia: No CHF: No ICU Stay: 3 days Transfusion: No    Complications: Resp failure: Yes.  , '[ ]'$  Pneumonia, [x ] Ventilator Chg in renal function: Yes.  , [x ] Inc. Cr > 0.5, [ x] Temp. Dialysis, '[ ]'$  Permanent dialysis Leg ischemia: Yes.  , no Surgery needed, '[x]'$  Yes, Surgery needed, '[ ]'$  Amputation Bowel ischemia: No., '[ ]'$  Medical Rx, '[ ]'$  Surgical Rx Wound complication: No., '[ ]'$  Superficial separation/infection, '[ ]'$  Return to OR Return to OR: Yes  Return to OR for bleeding: No Stroke: No., '[ ]'$  Minor, '[ ]'$  Major

## 2014-11-17 NOTE — Progress Notes (Signed)
Hypoglycemic Event  CBG: 64  Treatment: D50 IV 25 mL  Symptoms: None  Follow-up CBG: Time:0500 CBG Result:122  Possible Reasons for Event: Unknown  Comments/MD notified:    Mikey Kirschner  Remember to initiate Hypoglycemia Order Set & complete

## 2014-11-17 DEATH — deceased

## 2014-12-06 ENCOUNTER — Ambulatory Visit: Payer: Self-pay | Admitting: Radiation Oncology

## 2015-01-07 ENCOUNTER — Ambulatory Visit: Payer: Medicare Other | Admitting: Cardiovascular Disease

## 2015-03-28 ENCOUNTER — Ambulatory Visit: Payer: Medicare Other | Admitting: Cardiothoracic Surgery

## 2016-01-15 IMAGING — CR DG CHEST 1V PORT
1 series · 1 of 1 positions shown · non-contrast
Comparison: 12/22/2013

CLINICAL DATA: Aortic valve replacement

EXAM:
PORTABLE CHEST - 1 VIEW

[portable]
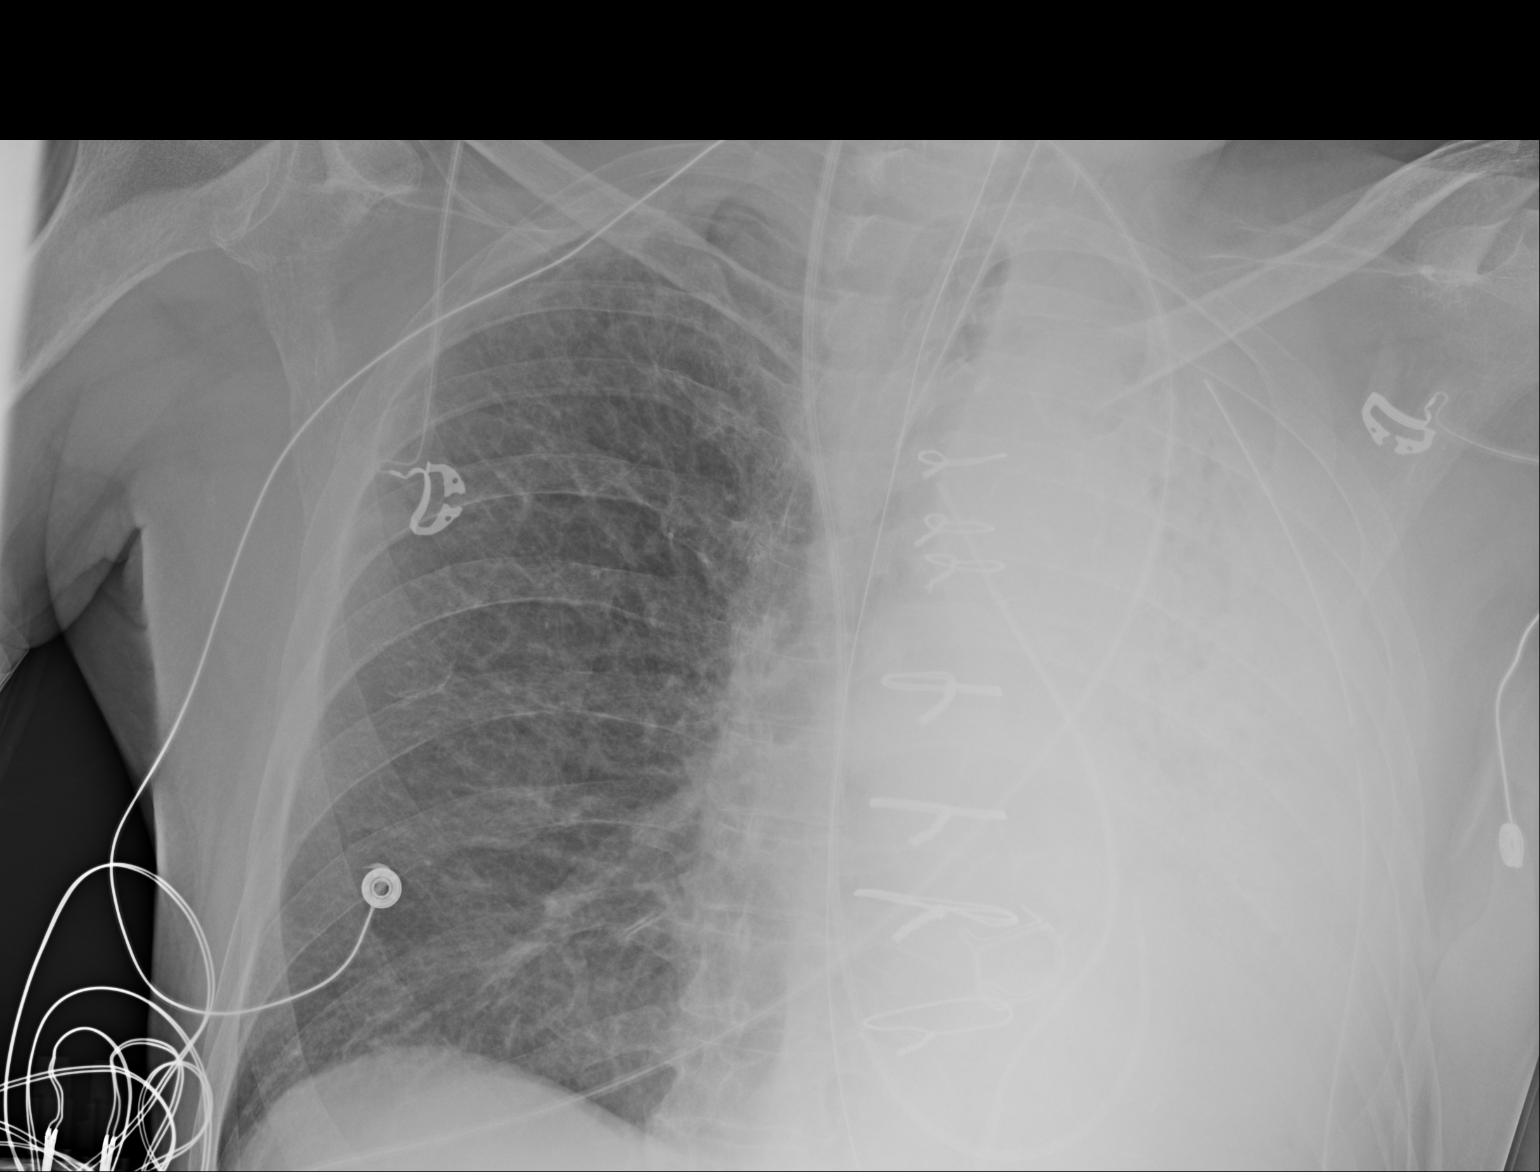

[1 of 1 positions shown; findings below may reference images not displayed]

FINDINGS: There is an endotracheal tube with the tip 4.9 cm above the carina.
There is a nasogastric tube coursing below the diaphragm. There is a
mediastinal drain present. There is a Swan-Ganz catheter with the
tip projecting over the right ventricle.

The right lung is clear.

There is near complete opacification of the left lung. There is a
left-sided chest tube present directed towards the apex.

Stable cardiomediastinal silhouette. Interval CABG, aortic valve
replacement and left upper lung mass biopsy.
IMPRESSION: 1. Support lines and tubing as detailed above.
2. Near complete opacification of the left lung with a left-sided
chest tube in satisfactory position. This likely represents a
combination of pleural fluid and atelectatic lung.

## 2016-01-15 IMAGING — CR DG CHEST 1V PORT
2 series · 2 of 2 positions shown · non-contrast
Comparison: 12/26/2013

CLINICAL DATA: Status post CABG x2

EXAM:
PORTABLE CHEST - 1 VIEW

[AP (1 of 2)]
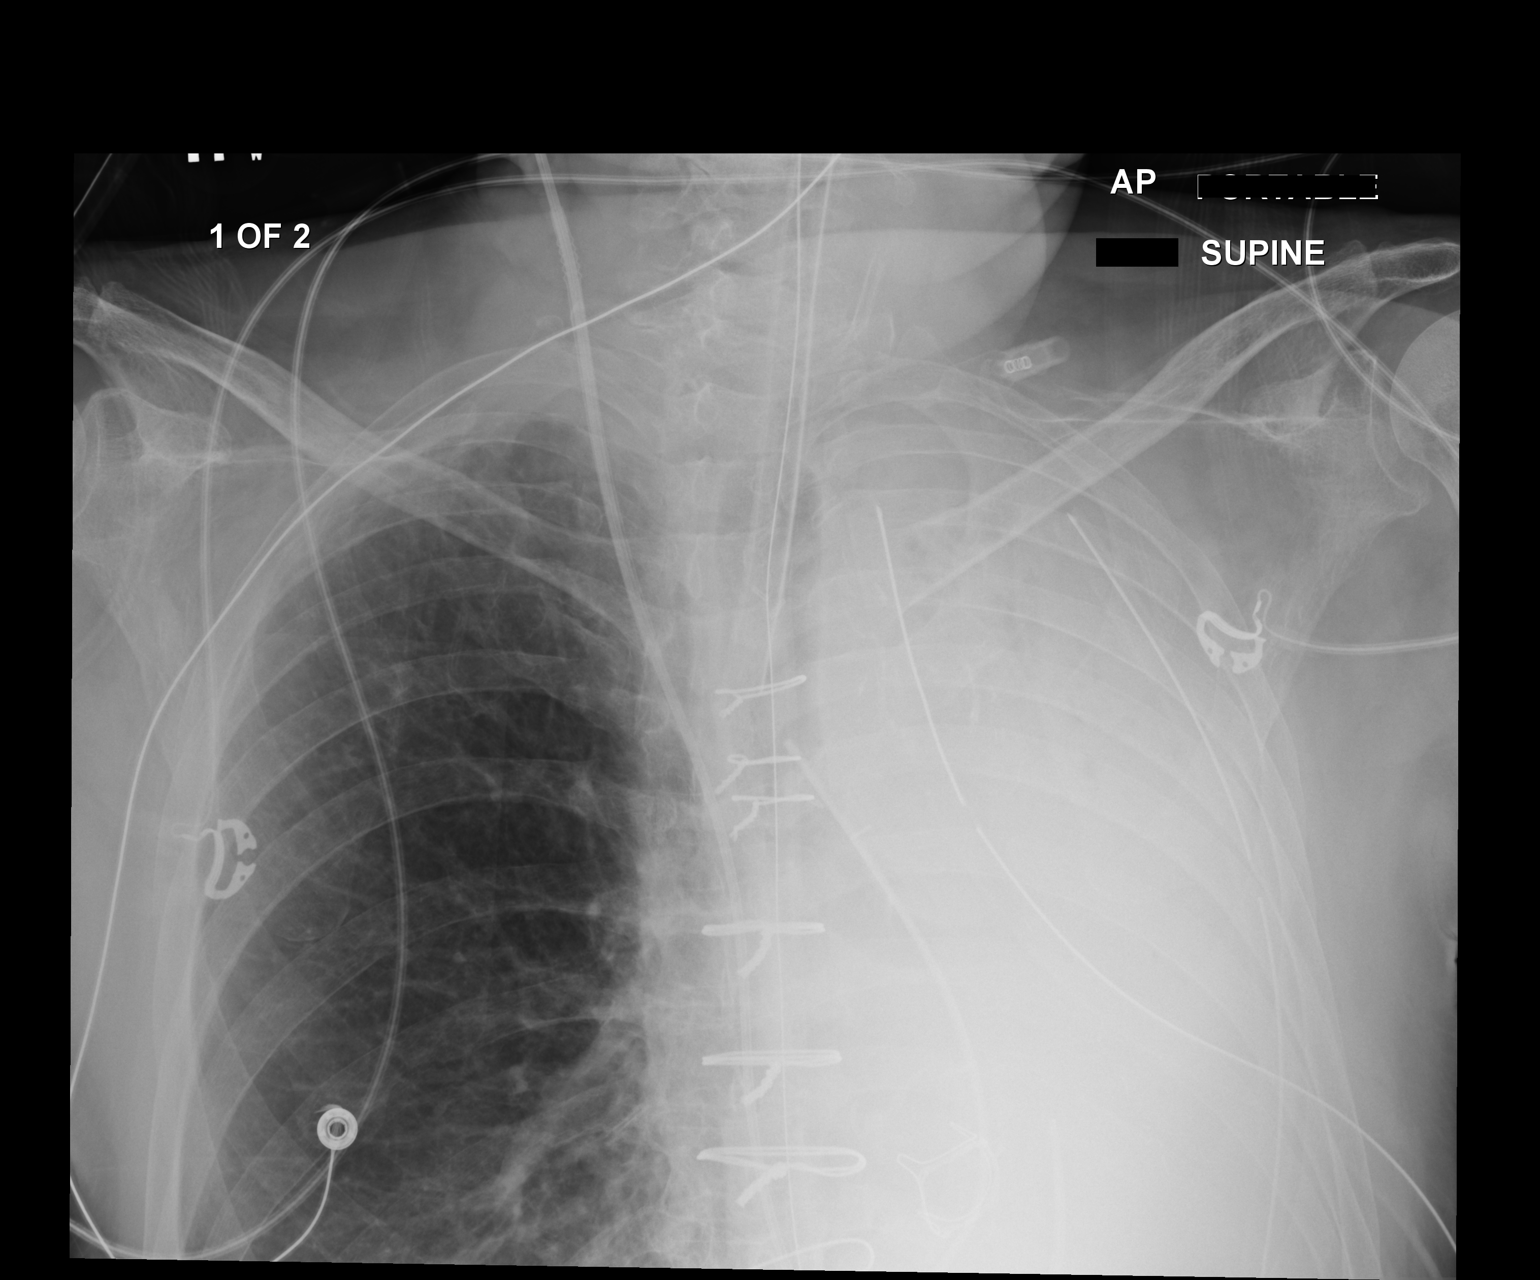

[AP (2 of 2)]
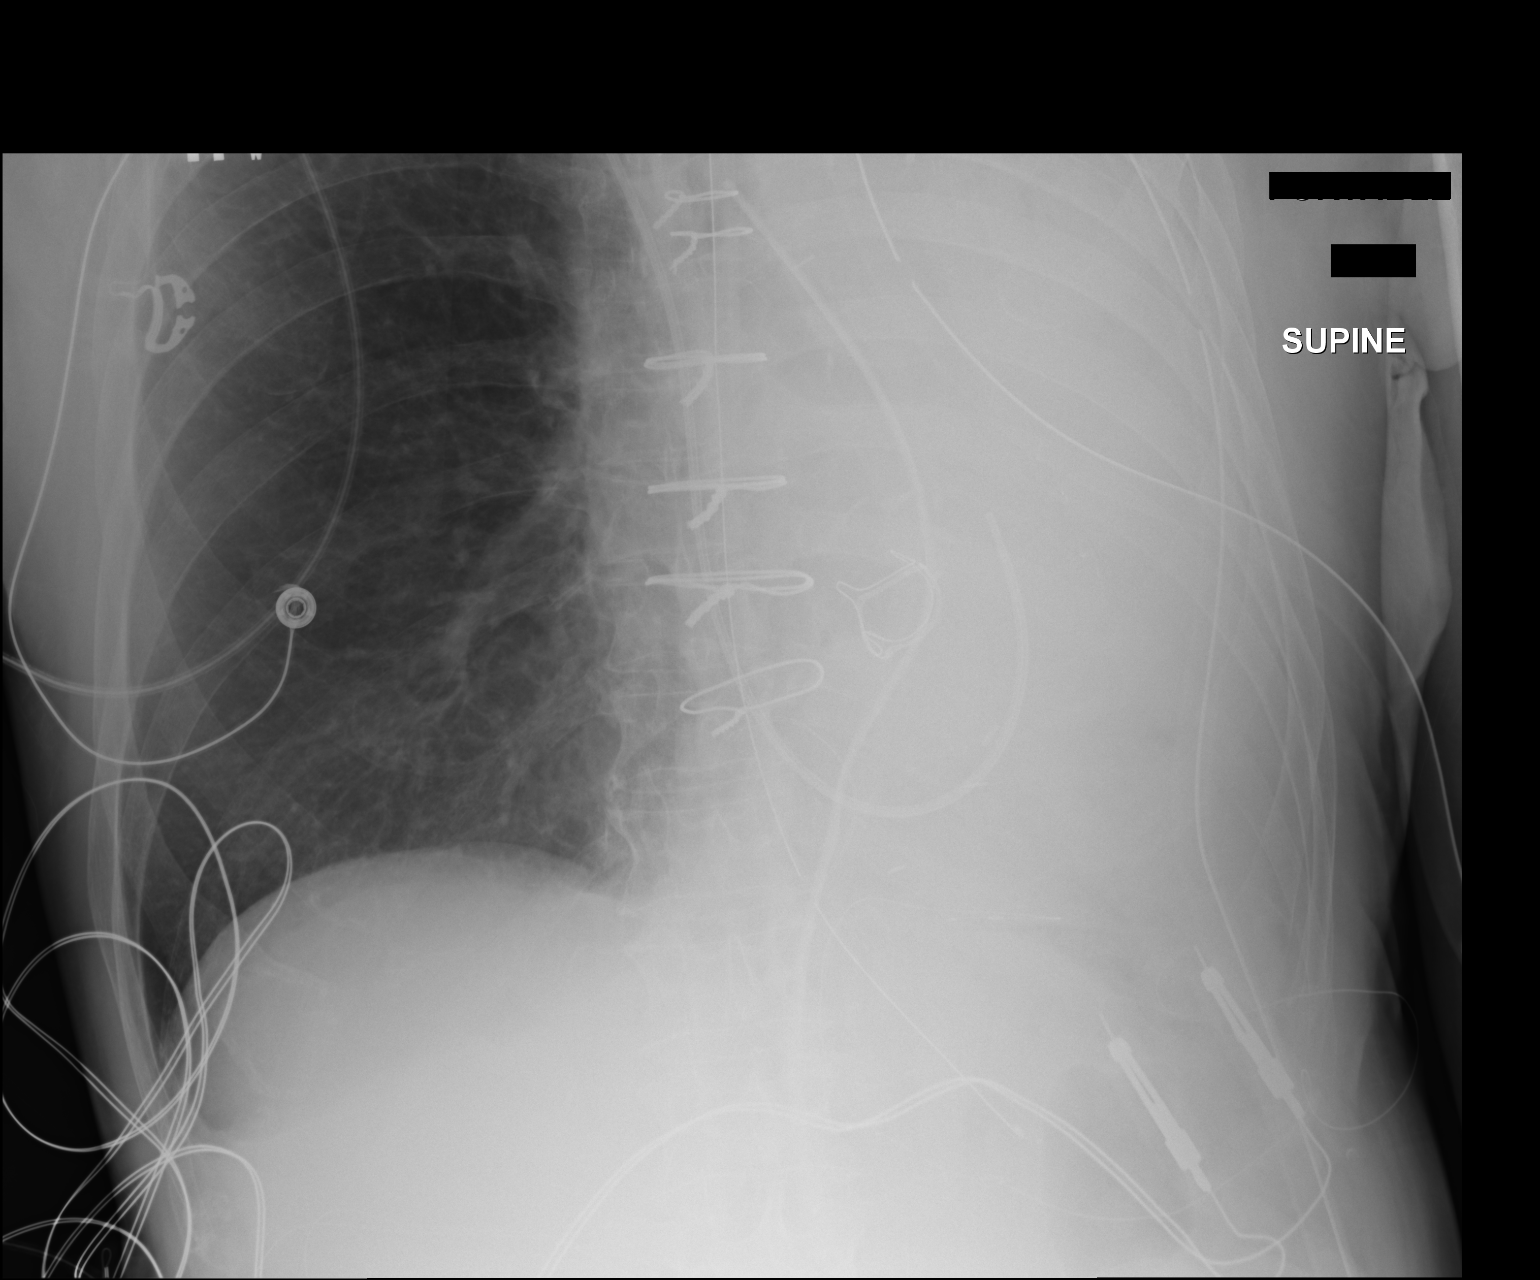

[2 of 2 positions shown; findings below may reference images not displayed]

FINDINGS: Endotracheal tube terminates 5 cm above the carina.

Complete opacification left hemithorax. Two left apical chest tubes.
Mediastinal drain.

Left IJ Swan-Ganz catheter terminates in the main pulmonary artery.

Prosthetic aortic valve. Postsurgical changes related to prior CABG.
Leftward cardiomediastinal shaft.

Right lung is clear.  No right pneumothorax.
IMPRESSION: Endotracheal tube terminates 5 cm above the carina.

Additional support apparatus as above.

Complete opacification of the left hemithorax, correlate for
hemothorax.

## 2016-05-07 IMAGING — CR DG CHEST 2V
4 series · 4 of 4 positions shown · non-contrast
Comparison: Portable chest x-ray of 04/18/2014 and CT chest of
03/26/2014

CLINICAL DATA: Recent biopsy, evaluate for pneumothorax

EXAM:
CHEST  2 VIEW

[w chest lat (1 of 2)]
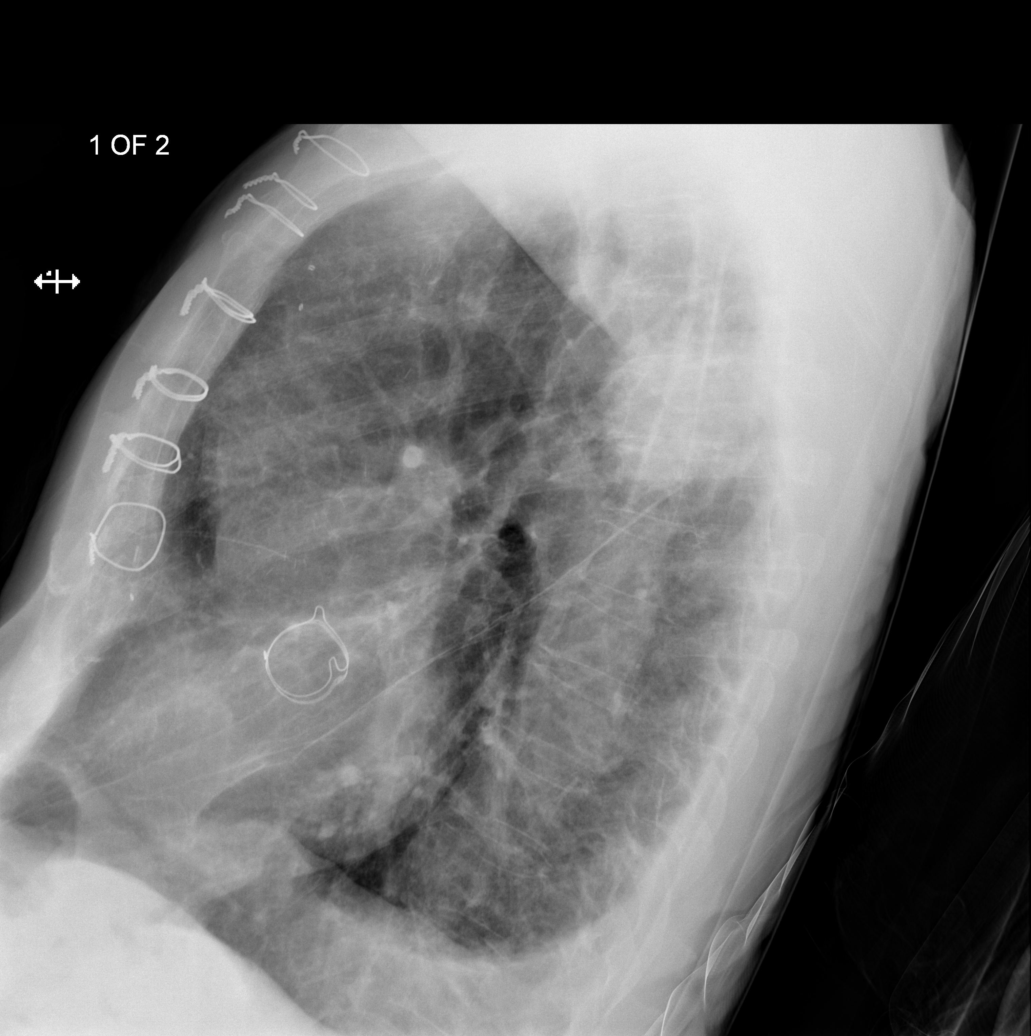

[w chest lat (2 of 2)]
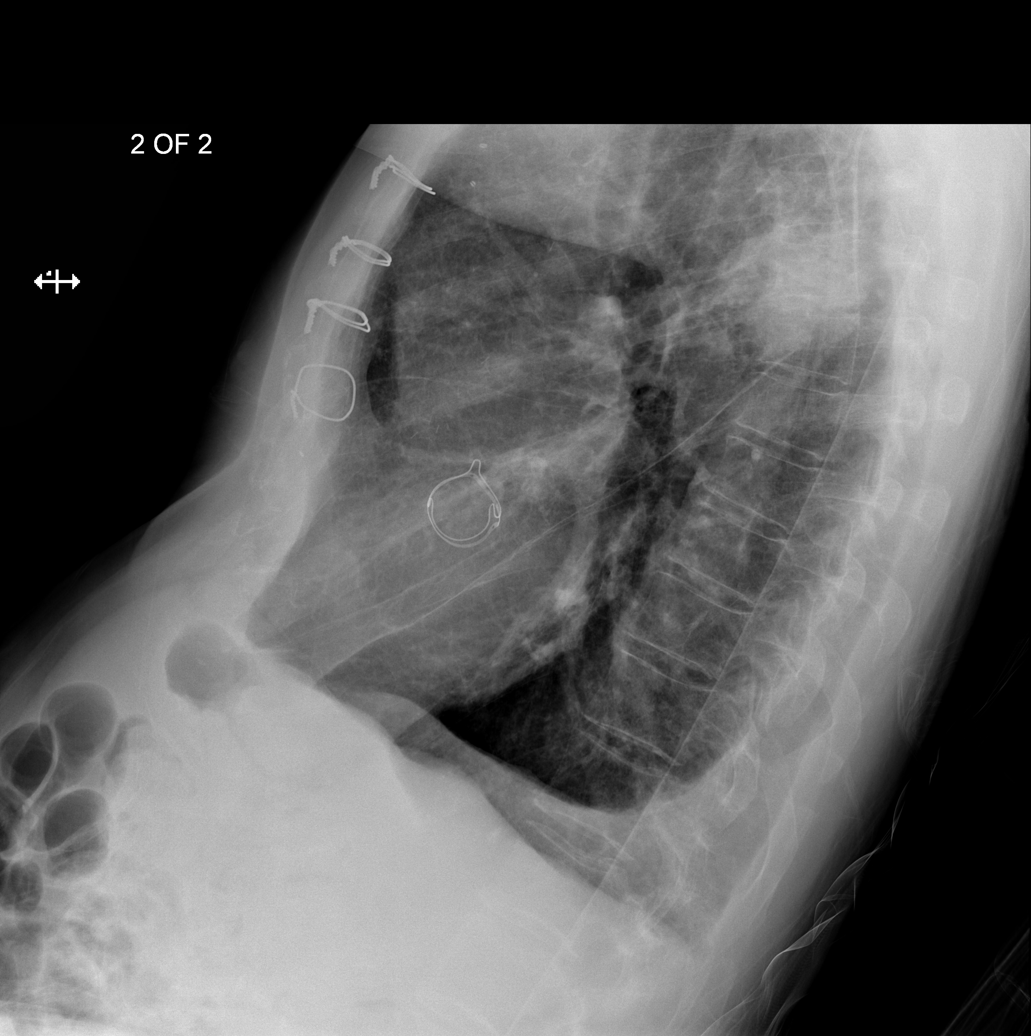

[x chest ap (1 of 2)]
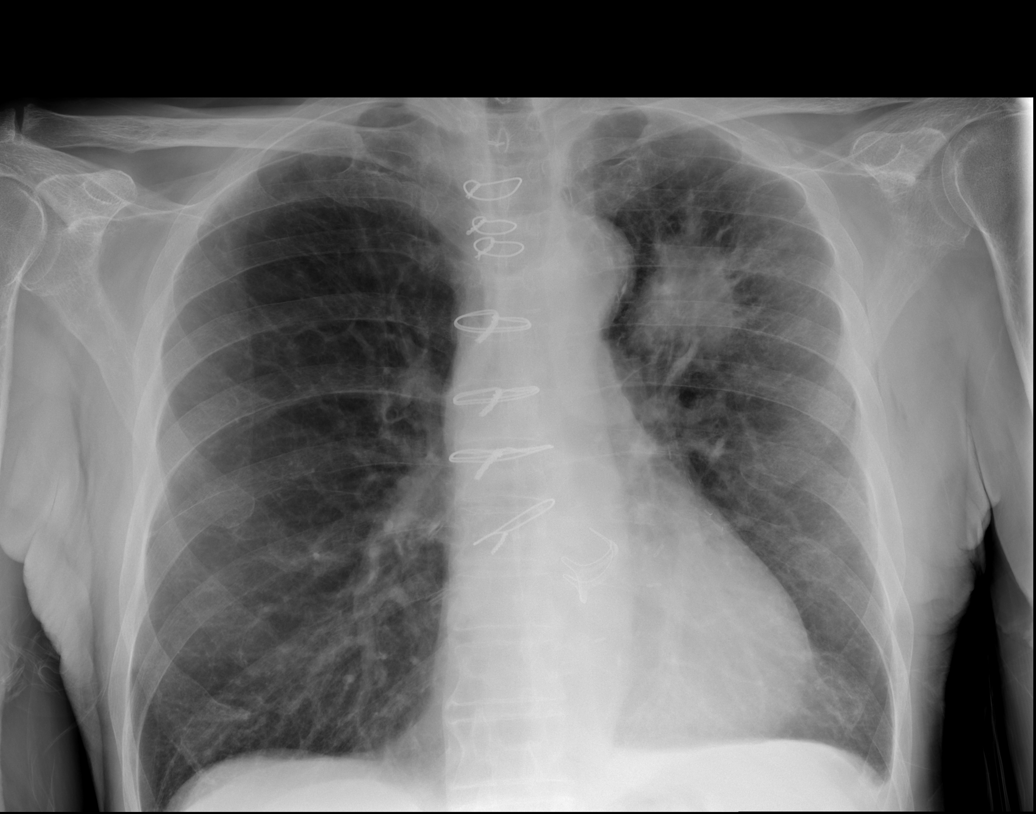

[x chest ap (2 of 2)]
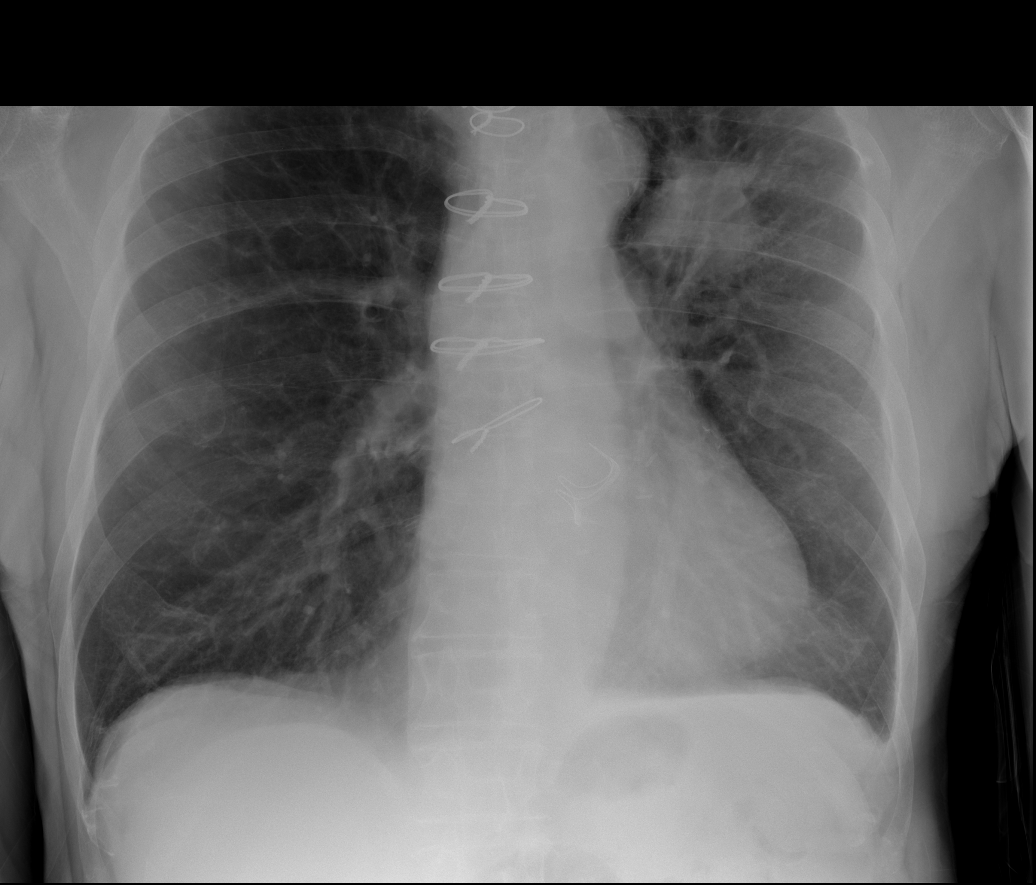

[4 of 4 positions shown; findings below may reference images not displayed]

FINDINGS: The large left upper lobe mass is again noted consistent with
primary lung carcinoma. A left pleural effusion is present. The
lungs are hyperaerated consistent with emphysema. The heart is
mildly enlarged and stable. Median sternotomy sutures are noted from
prior aortic valve replacement.
IMPRESSION: 1. No change in large left upper lobe mass consistent with primary
lung carcinoma.
2. Small left pleural effusion.
3. Emphysema.
